# Patient Record
Sex: Male | Born: 1945 | Race: Black or African American | Hispanic: No | Smoking: Current every day smoker
Health system: Southern US, Community
[De-identification: ages and names within clinical notes are randomized; demographics above are authoritative.]

## PROBLEM LIST (undated history)

## (undated) DIAGNOSIS — K219 Gastro-esophageal reflux disease without esophagitis: Secondary | ICD-10-CM

## (undated) DIAGNOSIS — J9600 Acute respiratory failure, unspecified whether with hypoxia or hypercapnia: Secondary | ICD-10-CM

## (undated) DIAGNOSIS — I10 Essential (primary) hypertension: Secondary | ICD-10-CM

## (undated) DIAGNOSIS — J449 Chronic obstructive pulmonary disease, unspecified: Secondary | ICD-10-CM

## (undated) DIAGNOSIS — M4802 Spinal stenosis, cervical region: Secondary | ICD-10-CM

## (undated) DIAGNOSIS — R490 Dysphonia: Secondary | ICD-10-CM

## (undated) DIAGNOSIS — Z720 Tobacco use: Secondary | ICD-10-CM

## (undated) HISTORY — PX: INGUINAL HERNIA REPAIR: SHX194

## (undated) HISTORY — DX: Gastro-esophageal reflux disease without esophagitis: K21.9

## (undated) HISTORY — DX: Dysphonia: R49.0

## (undated) HISTORY — DX: Chronic obstructive pulmonary disease, unspecified: J44.9

## (undated) HISTORY — DX: Spinal stenosis, cervical region: M48.02

## (undated) HISTORY — DX: Tobacco use: Z72.0

---

## 1998-12-20 ENCOUNTER — Encounter: Payer: Self-pay | Admitting: Emergency Medicine

## 1998-12-20 ENCOUNTER — Emergency Department (HOSPITAL_COMMUNITY): Admission: EM | Admit: 1998-12-20 | Discharge: 1998-12-20 | Payer: Self-pay | Admitting: Emergency Medicine

## 2000-08-20 ENCOUNTER — Encounter (HOSPITAL_BASED_OUTPATIENT_CLINIC_OR_DEPARTMENT_OTHER): Payer: Self-pay | Admitting: General Surgery

## 2000-08-22 ENCOUNTER — Encounter (INDEPENDENT_AMBULATORY_CARE_PROVIDER_SITE_OTHER): Payer: Self-pay | Admitting: Specialist

## 2000-08-22 ENCOUNTER — Ambulatory Visit (HOSPITAL_COMMUNITY): Admission: RE | Admit: 2000-08-22 | Discharge: 2000-08-22 | Payer: Self-pay | Admitting: General Surgery

## 2004-02-13 HISTORY — PX: POSTERIOR LAMINECTOMY / DECOMPRESSION CERVICAL SPINE: SUR739

## 2004-04-28 ENCOUNTER — Ambulatory Visit (HOSPITAL_COMMUNITY): Admission: RE | Admit: 2004-04-28 | Discharge: 2004-04-29 | Payer: Self-pay | Admitting: Neurosurgery

## 2005-03-26 ENCOUNTER — Emergency Department (HOSPITAL_COMMUNITY): Admission: EM | Admit: 2005-03-26 | Discharge: 2005-03-26 | Payer: Self-pay | Admitting: Emergency Medicine

## 2006-01-07 ENCOUNTER — Emergency Department (HOSPITAL_COMMUNITY): Admission: EM | Admit: 2006-01-07 | Discharge: 2006-01-07 | Payer: Self-pay | Admitting: Emergency Medicine

## 2006-04-12 ENCOUNTER — Emergency Department (HOSPITAL_COMMUNITY): Admission: EM | Admit: 2006-04-12 | Discharge: 2006-04-12 | Payer: Self-pay | Admitting: Emergency Medicine

## 2007-05-21 ENCOUNTER — Emergency Department (HOSPITAL_COMMUNITY): Admission: EM | Admit: 2007-05-21 | Discharge: 2007-05-21 | Payer: Self-pay | Admitting: Emergency Medicine

## 2009-12-19 ENCOUNTER — Emergency Department (HOSPITAL_COMMUNITY): Admission: EM | Admit: 2009-12-19 | Discharge: 2009-12-19 | Payer: Self-pay | Admitting: Emergency Medicine

## 2010-04-25 LAB — CBC
Hemoglobin: 15.1 g/dL (ref 13.0–17.0)
Platelets: 220 10*3/uL (ref 150–400)
RDW: 13.7 % (ref 11.5–15.5)

## 2010-06-30 NOTE — Op Note (Signed)
[place]. [facility]  Patient:    GILLIS, BOARDLEY                      MRN: [id number] Proc. Date: 08/22/00 Adm. Date:  [id number] Attending:  Sonda Primes                           Operative Report  PREOPERATIVE DIAGNOSIS:  Left inguinal hernia.  POSTOPERATIVE DIAGNOSIS:  Left inguinal hernia.  PROCEDURE:  Left inguinal herniorrhaphy with Prolene mesh.  SURGEON:  Mardene Celeste. Lurene Shadow, M.D.  ASSISTANT:  Nurse.  ANESTHESIA:  MAC.  I used 0.5% Sensorcaine with epinephrine.  CLINICAL NOTE:  This patient is a 65 year old man presenting with a left-sided inguinal hernia, which has been enlarging over time.  He has had a right inguinal herniorrhaphy done in the remote past.  There has been no evidence of recurrence on the right side.  The patient was made aware of the risks and potential benefits of herniorrhaphy and is brought now to the operating room for repair.  DESCRIPTION OF PROCEDURE:  Following the induction of satisfactory sedation with the patient positioned supinely, the left groin is prepped and draped to be included in the sterile operative field.  I infiltrated the lower abdominal crease with 0.5% Sensorcaine with epinephrine.  Transverse incision in the lower abdominal crease was dissected down to and through the external oblique aponeurosis.  Distal injections of Sensorcaine were used throughout the procedure to maintain analgesia.  The external oblique aponeurosis is opened up through the external inguinal ring with protection of the ilioinguinal nerve, which is retracted medially and superiorly.  The patient had a rather large scrotal component, and the spermatic cord along with this very large hernial sac was then held with a Penrose drain and dissected down upon the hernial sac and opened up so that I could reduce the small intestine which was incarcerated into the scrotum back into the peritoneal cavity.  The hernial sac was  then dissected free from the cord, carried up to the internal ring, and the sac was then doubly suture ligated with 2-0 silk sutures and then amputated and the redundant sac removed and forwarded for pathologic evaluation.  The  _____ at [facility] triangle was then repaired with an onlay patch of Prolene mesh sewn in at the pubic tubercle and carried up along the conjoined tendon using a 2-0 running Novofil suture and again from the pubic tubercle up along the shelving edge of Pouparts ligament to the internal ring.  The mesh was split so as to allow protrusion of the cord between the leaflets of the mesh.  The tails of the mesh were trimmed and sutured into the internal oblique muscle, thus completely closing off the internal ring.  All areas of dissection were then checked for hemostasis and noted to be dry.  Sponge, instrument, and sharp counts were verified.  The external oblique aponeurosis was closed over the cord with a running suture of 2-0 Vicryl, subcutaneous tissues and Scarpas fascia closed with a running 3-0 Vicryl suture.  Skin was closed with a 5-0 Monocryl suture and then reinforced with Steri-Strips.  Sterile dressings were applied, anesthetic reversed, and the patient removed from the operating room to the recovery room in stable condition.  He tolerated the procedure well. DD:  08/22/00 TD:  08/22/00 Job: [id number]

## 2010-06-30 NOTE — Op Note (Signed)
NAMEKION, HUNTSBERRY NO.:  [id number]   MEDICAL RECORD NO.:  [id number]          PATIENT TYPE:  OIB   LOCATION:  2899                         FACILITY:  [facility]   PHYSICIAN:  Coletta Memos, M.D.     DATE OF BIRTH:  [birth year]   DATE OF PROCEDURE:  04/28/2004  DATE OF DISCHARGE:                                 OPERATIVE REPORT   PREOPERATIVE DIAGNOSIS:  Cervical spondylosis with myelopathy, cervical  stenosis, cervical degenerative disk disease with myelopathy.   POSTOPERATIVE DIAGNOSES:  Cervical spondylosis with myelopathy, cervical  stenosis, cervical degenerative disk disease with myelopathy.   PROCEDURE:  1.  Anterior cervical decompression C4-5, C5-6, C6-7.  2.  Arthrodesis C4 to C7 with interbody plugs placed at C4-5, 6 mm; C5-6 6      mm,  and C6-7, 7 mm;  anterior plating using Synthes small stature CSLP      plate, eight screws 14 mm in length.   COMPLICATIONS:  None.   SURGEON:  Coletta Memos, M.D.   ASSISTANT:  Hewitt Shorts, M.D.   FINDINGS:  Severe cervical stenosis and significant hypertrophy of the  posterior longitudinal ligament with some packed some areas of  calcification.   INDICATIONS:  Mr. Marvin Grabill is a 65 year old gentleman who presented to  the office with myelopathy. MRI showed severe stenosis present in C4-5 and  C5-6 and an abnormal signal just below the disk space at C6-7. I therefore  recommended and he agreed to undergo operative decompression.   OPERATIVE NOTE:  Mr. Skog was brought to the operating room intubated and  placed under a general anesthetic without difficulty. He was positioned with  his head on a horseshoe in slight extension without traction. The neck was  prepped and he was draped in a sterile fashion. I was able to then open the  skin in horizontal fashion extending from the midline to the left medial  border of the left sternocleidomastoid muscle. I then was able to identify  the platysma and  dissected both rostral and caudal to the platysma. I opened  the platysma in a horizontal fashion. I dissected inferior to the platysma  rostrally and caudally. I created an avascular plane to the cervical spine.  I took an x-ray and it showed the needle to be at C5-6. I then marked that  disk space.  I then reflected the longus colli muscles from C4 to C7. I did  this in a bilateral fashion.  I then started my diskectomy at C5-6 after  placing two distraction pins one at C5, the other one at C6. I opened the  disk space further using the #15 blade and proceeded to complete the  diskectomy using rongeurs, pituitary rongeurs, Kerrison punches, a high-  speed drill and curettes. The ligament was significantly hypertrophied.  Osteophytes were extensive posteriorly, both on C5 and C6.  I did a lot of  drilling to remove the osteophytes and create space in order to decompress  the spinal canal.  I also decompressed both C6 nerve roots. When I was  satisfied with this, I then  placed a 6 mm graft at that level anticipating I  would have to place other grafts and I did not want to over-distract his  neck. I then turned my attention to the C4-5 level, removed the distraction  pin from C6 and placed it into C4. I again completed the diskectomy after  opening the space with a #15 blade using a high-speed drill, Kerrison  punches. Again, the ligament was very thick and large osteophyte off the  posterior aspect of C4. After drilling that down, I was able to get  underneath it and then again decompress the spinal canal and spinal cord. I  also decompressed the C5 roots bilaterally. I then placed a 6 mm graft in  that space after hemostasis was obtained.  I then turned my attention to C6-  7 and I did not need to use distraction pins there. I was able to remove  what was a large overhanging osteophyte anteriorly so that I had access to  the disk space. Once that was done, I completed the diskectomy  using  pituitary rongeurs, 15 blade and curettes. This level frankly may have been  more spondylitic than the others with regard to the bone.  The ligament was  just as thick at this level also. I decompressed both C7 nerve roots. I  irrigated the wound. I then was able to place a 7 mm graft. I then put the  plate on and placed two screws, one in C4, the other in C6. Those screws  looked to be in good position. The plate was the correct length. I then bent  the plate so that it would fit to the spine better. I then placed the  remaining screws with Dr. Earl Gala assistance.  Eight screws were placed  in all, two in C4, two in C5, two C6 and two in C7. Locking screws were also  placed. I then irrigated the wound. I then closed the wound in layered  fashion using Vicryl sutures. Dermabond was used for sterile dressing.      KC/MEDQ  D:  04/28/2004  T:  04/30/2004  Job:  [id number]

## 2011-02-07 ENCOUNTER — Institutional Professional Consult (permissible substitution): Payer: Self-pay | Admitting: Internal Medicine

## 2011-03-12 ENCOUNTER — Encounter: Payer: Self-pay | Admitting: Internal Medicine

## 2011-03-13 ENCOUNTER — Ambulatory Visit (INDEPENDENT_AMBULATORY_CARE_PROVIDER_SITE_OTHER): Payer: Federal, State, Local not specified - PPO | Admitting: Internal Medicine

## 2011-03-13 ENCOUNTER — Other Ambulatory Visit: Payer: Federal, State, Local not specified - PPO

## 2011-03-13 ENCOUNTER — Encounter: Payer: Self-pay | Admitting: Internal Medicine

## 2011-03-13 DIAGNOSIS — M1990 Unspecified osteoarthritis, unspecified site: Secondary | ICD-10-CM

## 2011-03-13 DIAGNOSIS — R053 Chronic cough: Secondary | ICD-10-CM

## 2011-03-13 DIAGNOSIS — Z720 Tobacco use: Secondary | ICD-10-CM

## 2011-03-13 DIAGNOSIS — F17200 Nicotine dependence, unspecified, uncomplicated: Secondary | ICD-10-CM

## 2011-03-13 DIAGNOSIS — M129 Arthropathy, unspecified: Secondary | ICD-10-CM

## 2011-03-13 DIAGNOSIS — J449 Chronic obstructive pulmonary disease, unspecified: Secondary | ICD-10-CM

## 2011-03-13 DIAGNOSIS — R05 Cough: Secondary | ICD-10-CM | POA: Insufficient documentation

## 2011-03-13 MED ORDER — TIOTROPIUM BROMIDE MONOHYDRATE 18 MCG IN CAPS: 18.0000 ug | ORAL_CAPSULE | Freq: Every day | RESPIRATORY_TRACT | Status: DC

## 2011-03-13 NOTE — Progress Notes (Addendum)
Subjective:    Patient ID: Phillip May, male    DOB: 08-19-1945, 66 y.o.   MRN: [id number]  HPI  IOV 03/13/2011  66 year male referrd by Dr Merri Brunette and Abelardo Diesel PA for dyspnea.  Reports that he has been smoking Cigarettes. Smokes 2-3 cigs per day. Been smoking over 30 years. Most amount he smoked was 1 ppd. Wants to quit. Says he is trying hard to quit. Not tried meds. Ready to try medications.    Reports dyspnea. Says it is sporadic but always with exertion like walking fast, sometimes doing groceries, always with 1 flight of steps but never during conversation. Sudden onset 3-4 months ago but does not recollect details. Progressive. Improved by  Rest. Denies associated chest pain, paroxysmal nocturnal dyspnea, wheezing, orthopnea. Denies associated CAD. There is family hx of COPD (dad, dead at age 42 from copd).   This is associated with voice hoarseness  but says his voice has always been 'gravelly". Saw [facility] ENT in 2006 and was told he had GERD. After cervical decompression in 2007 it got worse. There is associated chronic cough x 8-10 months.Has been seeing Dr Narda Bonds recently dec 2012 and was reportedly told there is mucus in throat and given dexilant that helped. Cough is associated feeling of tickle, something stuck in throat and sometimes worse when it lies down. Nocturnal awakenings occasionally from cough +. Denies post nasal drip. OVerall feels cough is from throat. Quality of cough is variable - dry and sometimes with mucus.  RSI cough socre is 37 of total 45 (Level 5 - hoarseness, difficulty swalloing foods. Level 4  - clearing of throat, coughing after lying down, choking episods, troublesome cough, heartburn and Level 3 - senssation of lump in throat)  Of note, reports early morning stiffness of hand joints for years and difficulty with picking objects past few years.   Spirometry at Dr Renne Crigler office 01/08/11 fev1 0.8L/32%, Ratio 41. Flow volume loop variable ? insp  obstruction  CXR 01/29/11:  Hyperinflation with increased reticular markings (personally reviewed)  Body mass index is 20.14 kg/(m^2). on 03/13/11 and feels this weight is stable past 1 year HGb 13.5gm% with MCV 104 in 01/25/11 Creat 1.1mg % 01/25/12  Walking 185 feet x 3 laps in office 03/13/2011 and did not desaturate   Past Medical History  Diagnosis Date  . Inguinal hernia   . COPD (chronic obstructive pulmonary disease)   . GERD (gastroesophageal reflux disease)   . Chronic hoarseness   . Tobacco abuse      Family History  Problem Relation Age of Onset  . Emphysema Father   . Stomach cancer Sister      History   Social History  . Marital Status: Divorced    Spouse Name: N/A    Number of Children: N/A  . Years of Education: N/A   Occupational History  . investigator for [employer]    Social History Main Topics  . Smoking status: Current Everyday Smoker -- 0.2 packs/day for 30 years    Types: Cigarettes  . Smokeless tobacco: Not on file   Comment: 3-4 cigs a day  . Alcohol Use: No  . Drug Use: No  . Sexually Active: Not on file   Other Topics Concern  . Not on file   Social History Narrative  . No narrative on file     No Known Allergies   No outpatient prescriptions prior to visit.  Review of Systems  Constitutional: Negative for fever and unexpected weight change.  HENT: Negative for ear pain, nosebleeds, congestion, sore throat, rhinorrhea, sneezing, trouble swallowing, dental problem, postnasal drip and sinus pressure.   Eyes: Negative for redness and itching.  Respiratory: Positive for chest tightness, shortness of breath and wheezing. Negative for cough.   Cardiovascular: Negative for palpitations and leg swelling.  Gastrointestinal: Negative for nausea and vomiting.  Genitourinary: Negative for dysuria.  Musculoskeletal: Negative for joint swelling.  Skin: Negative for rash.  Neurological: Negative for headaches.    Hematological: Does not bruise/bleed easily.  Psychiatric/Behavioral: Negative for dysphoric mood. The patient is not nervous/anxious.        Objective:   Physical Exam  Nursing note and vitals reviewed. Constitutional: He is oriented to person, place, and time. He appears well-developed and well-nourished. No distress.       Body mass index is 20.14 kg/(m^2).   HENT:  Head: Normocephalic and atraumatic.  Right Ear: External ear normal.  Left Ear: External ear normal.  Mouth/Throat: Oropharynx is clear and moist. No oropharyngeal exudate.       Hoarse voice which he says is baseline  Eyes: Conjunctivae and EOM are normal. Pupils are equal, round, and reactive to light. Right eye exhibits no discharge. Left eye exhibits no discharge. No scleral icterus.  Neck: Normal range of motion. Neck supple. No JVD present. No tracheal deviation present. No thyromegaly present.  Cardiovascular: Normal rate, regular rhythm and intact distal pulses.  Exam reveals no gallop and no friction rub.   No murmur heard. Pulmonary/Chest: Effort normal and breath sounds normal. No respiratory distress. He has no wheezes. He has no rales. He exhibits no tenderness.       barrell chest Prolonged expiration No wheeze No distress  Abdominal: Soft. Bowel sounds are normal. He exhibits no distension and no mass. There is no tenderness. There is no rebound and no guarding.  Musculoskeletal: Normal range of motion. He exhibits no edema and no tenderness.       Hands appear to have deformities c/w Rheumatoid arthritis. Denies raynaud  Lymphadenopathy:    He has no cervical adenopathy.  Neurological: He is alert and oriented to person, place, and time. He has normal reflexes. No cranial nerve deficit. Coordination normal.  Skin: Skin is warm and dry. No rash noted. He is not diaphoretic. No erythema. No pallor.  Psychiatric: He has a normal mood and affect. His behavior is normal. Judgment and thought content  normal.          Assessment & Plan:

## 2011-03-13 NOTE — Assessment & Plan Note (Signed)
?   Has RA on physical exam  Plan Check ana, ra factor and anti ccp

## 2011-03-13 NOTE — Assessment & Plan Note (Signed)
RSI score 37 is very c/w LPR cough. There is hx of GERD but no clear cut sinus history. Will Rx copd with spiriva first and reasess with RSI cough score

## 2011-03-13 NOTE — Assessment & Plan Note (Signed)
He knows he needs to quit. WE discussed quitting. He thinks he is ready to quit although a month ago he told PMD he is not ready to quit. HE will start by trying to quit the first cig in the morning prior to leaving for work.   3 min counseling

## 2011-03-13 NOTE — Patient Instructions (Signed)
#  COPD  - it appears this is severe  - please start Please start spiriva 1 puff daily - take sample, script and show technique - have followup full breathing test called PFT in 1 month - alpha 1 check at followup #SMoking  - please start by working on quitting the first morning cigarette #Cough  - let us see how this improves with spiriva  - at followup will do cough score #ARthritis of hand  - check blood test for rheumatoid arthritis #Followup  - return to see me in 1 month with full PFT done at time of followup  - needs cough score and alpha 1 check at followup

## 2011-03-13 NOTE — Assessment & Plan Note (Signed)
Appears to have severe copd on outside spirometry but is not desaturating on simple exertion.  PLAN Start spiriva and reassess with full PFT in  1 month

## 2011-03-14 LAB — ANA: Anti Nuclear Antibody(ANA): NEGATIVE

## 2011-03-27 ENCOUNTER — Telehealth: Payer: Self-pay | Admitting: Internal Medicine

## 2011-03-27 NOTE — Telephone Encounter (Signed)
Advised pt Rx was sent to [facility] on [address] on 1/29 & should be awaiting his pick up.  Pt advised nothing further needed.  Antionette Fairy

## 2011-03-27 NOTE — Telephone Encounter (Signed)
lmomtcb x1 shows rx was sent on 03/13/11

## 2011-04-23 ENCOUNTER — Encounter: Payer: Self-pay | Admitting: Neurology

## 2011-04-23 ENCOUNTER — Ambulatory Visit (INDEPENDENT_AMBULATORY_CARE_PROVIDER_SITE_OTHER): Payer: Federal, State, Local not specified - PPO | Admitting: Internal Medicine

## 2011-04-23 ENCOUNTER — Encounter: Payer: Self-pay | Admitting: Internal Medicine

## 2011-04-23 VITALS — BP 126/78 | HR 67 | Temp 98.5°F | Ht 67.0 in | Wt 131.0 lb

## 2011-04-23 DIAGNOSIS — M069 Rheumatoid arthritis, unspecified: Secondary | ICD-10-CM

## 2011-04-23 DIAGNOSIS — M1990 Unspecified osteoarthritis, unspecified site: Secondary | ICD-10-CM

## 2011-04-23 DIAGNOSIS — R05 Cough: Secondary | ICD-10-CM

## 2011-04-23 DIAGNOSIS — J449 Chronic obstructive pulmonary disease, unspecified: Secondary | ICD-10-CM

## 2011-04-23 DIAGNOSIS — M129 Arthropathy, unspecified: Secondary | ICD-10-CM

## 2011-04-23 DIAGNOSIS — F17200 Nicotine dependence, unspecified, uncomplicated: Secondary | ICD-10-CM

## 2011-04-23 DIAGNOSIS — Z720 Tobacco use: Secondary | ICD-10-CM

## 2011-04-23 LAB — PULMONARY FUNCTION TEST

## 2011-04-23 MED ORDER — BUDESONIDE-FORMOTEROL FUMARATE 160-4.5 MCG/ACT IN AERO: 2.0000 | INHALATION_SPRAY | Freq: Two times a day (BID) | RESPIRATORY_TRACT | Status: AC

## 2011-04-23 NOTE — Assessment & Plan Note (Signed)
Improved 50 % with spiriva. Will see response to symbicort. Check cough score at fu. Noted serration on flow volume on PFT 04/23/2011 c/w hoarse voice which is likely contributing to cough

## 2011-04-23 NOTE — Assessment & Plan Note (Signed)
Says RA ruled out through xrays and blood work by DR Dierdre Forth. HE feels he has coordination issue. Will refer to Dr Modesto Charon neurologist at [facility]

## 2011-04-23 NOTE — Progress Notes (Signed)
Subjective:    Patient ID: Phillip May, male    DOB: 26-Sep-1945, 66 y.o.   MRN: [id number]  HPI OV 03/13/2011  66 year male referrd by Dr Merri Brunette and Abelardo Diesel PA for dyspnea.  Reports that he has been smoking Cigarettes. Smokes 2-3 cigs per day. Been smoking over 30 years. Most amount he smoked was 1 ppd. Wants to quit. Says he is trying hard to quit. Not tried meds. Ready to try medications.    Reports dyspnea. Says it is sporadic but always with exertion like walking fast, sometimes doing groceries, always with 1 flight of steps but never during conversation. Sudden onset 3-4 months ago but does not recollect details. Progressive. Improved by  Rest. Denies associated chest pain, paroxysmal nocturnal dyspnea, wheezing, orthopnea. Denies associated CAD. There is family hx of COPD (dad, dead at age 56 from copd).   This is associated with voice hoarseness  but says his voice has always been 'gravelly". Saw [facility] in 2006 and was told he had GERD. After cervical decompression in 2007 it got worse. There is associated chronic cough x 8-10 months.Has been seeing Dr Narda Bonds recently dec 2012 and was reportedly told there is mucus in throat and given dexilant that helped. Cough is associated feeling of tickle, something stuck in throat and sometimes worse when it lies down. Nocturnal awakenings occasionally from cough +. Denies post nasal drip. OVerall feels cough is from throat. Quality of cough is variable - dry and sometimes with mucus.  RSI cough socre is 37 of total 45 (Level 5 - hoarseness, difficulty swalloing foods. Level 4  - clearing of throat, coughing after lying down, choking episods, troublesome cough, heartburn and Level 3 - senssation of lump in throat)  Of note, reports early morning stiffness of hand joints for years and difficulty with picking objects past few years.   Spirometry at Dr Renne Crigler office 01/08/11 fev1 0.8L/32%, Ratio 41. Flow volume loop variable ? insp  obstruction  CXR 01/29/11:  Hyperinflation with increased reticular markings (personally reviewed)  Body mass index is 20.14 kg/(m^2). on 03/13/11 and feels this weight is stable past 1 year HGb 13.5gm% with MCV 104 in 01/25/11 Creat 1.1mg % 01/25/12  Walking 185 feet x 3 laps in office 03/13/2011 and did not desaturate  REC  #COPD  - it appears this is severe  - please start Please start spiriva 1 puff daily - take sample, script and show technique - have followup full breathing test called PFT in 1 month - alpha 1 check at followup #SMoking  - please start by working on quitting the first morning cigarette #Cough  - let us see how this improves with spiriva  - at followup will do cough score #ARthritis of hand  - check blood test for rheumatoid arthritis #Followup  - return to see me in 1 month with full PFT done at time of followup  - needs cough score and alpha 1 check at followup   OV 04/23/2011 Followup for above. Says dyspnea and cough 50% better with spiriva although since this morning he thinks dyspnea a tad bit worse. Denies fever, sputum, wheeze or feeling of illness. PFT today shows gold stage 4 copd - fev1 0.8L/29%, 9% BD response, RAtop 37, DLCO 7/42%. Flow volume loop show serrations c/w hoarse voice. Marland Kitchen Has had flu shot. Wants to postpone pneumovax to next visit.  No change in hoarse voice. STill struggling to quit smoking. Says he has cut down but  has frequent relapses.IN terms of arthritis of hand  - he denies arthritis. Says Dr Dierdre Forth has ruled out RA and other autoimmune diseaess. Feels he has neuro issue and coordination issue. Asking for referral to neurologist.   Past, Family, Social reviewed: no change since last visit other than HPI   Review of Systems  Constitutional: Negative for fever and unexpected weight change.  HENT: Negative for ear pain, nosebleeds, congestion, sore throat, rhinorrhea, sneezing, trouble swallowing, dental problem, postnasal drip and  sinus pressure.   Eyes: Negative for redness and itching.  Respiratory: Positive for cough, shortness of breath and wheezing. Negative for chest tightness.   Cardiovascular: Negative for palpitations and leg swelling.  Gastrointestinal: Negative for nausea and vomiting.  Genitourinary: Negative for dysuria.  Musculoskeletal: Negative for joint swelling.  Skin: Negative for rash.  Neurological: Negative for headaches.  Hematological: Does not bruise/bleed easily.  Psychiatric/Behavioral: Negative for dysphoric mood. The patient is not nervous/anxious.        Objective:   Physical Exam Physical Exam  Nursing note and vitals reviewed. Constitutional: He is oriented to person, place, and time. He appears well-developed and well-nourished. No distress.       Body mass index is 20.14 kg/(m^2).   HENT:  Head: Normocephalic and atraumatic.  Right Ear: External ear normal.  Left Ear: External ear normal.  Mouth/Throat: Oropharynx is clear and moist. No oropharyngeal exudate.       Hoarse voice which he says is baseline  Eyes: Conjunctivae and EOM are normal. Pupils are equal, round, and reactive to light. Right eye exhibits no discharge. Left eye exhibits no discharge. No scleral icterus.  Neck: Normal range of motion. Neck supple. No JVD present. No tracheal deviation present. No thyromegaly present.  Cardiovascular: Normal rate, regular rhythm and intact distal pulses.  Exam reveals no gallop and no friction rub.   No murmur heard. Pulmonary/Chest: Effort normal and breath sounds normal. No respiratory distress. He has no wheezes. He has no rales. He exhibits no tenderness.       barrell chest Prolonged expiration No wheeze No distress  Abdominal: Soft. Bowel sounds are normal. He exhibits no distension and no mass. There is no tenderness. There is no rebound and no guarding.  Musculoskeletal: Normal range of motion. He exhibits no edema and no tenderness.       Hands appear to have  deformities Lymphadenopathy:    He has no cervical adenopathy.  Neurological: He is alert and oriented to person, place, and time. He has normal reflexes. No cranial nerve deficit. Coordination normal.  Skin: Skin is warm and dry. No rash noted. He is not diaphoretic. No erythema. No pallor.  Psychiatric: He has a normal mood and affect. His behavior is normal. Judgment and thought content normal.         Assessment & Plan:  I

## 2011-04-23 NOTE — Progress Notes (Signed)
PFT done today. 

## 2011-04-23 NOTE — Assessment & Plan Note (Signed)
Discussed 3 minutes quitting strategies. HE is struggling. To start with asked him to work on pushing first cig in am from 8.30am to 10.30am

## 2011-04-23 NOTE — Assessment & Plan Note (Signed)
#  COPD  - it appears this is very  severe  - please continue  spiriva 1 puff daily \ - Please start symbicort 160/4.5 2 puff twice daily - take sample, script and show technique - refer to pulmonary rehab - asked to call if symptoms worsen, then we can do pred and antibiotics over phone - at followup give pneumovax, check alpha 1

## 2011-04-23 NOTE — Patient Instructions (Addendum)
#  COPD  - it appears this is very  severe  - please continue  spiriva 1 puff daily \ - Please start symbicort 160/4.5 2 puff twice daily - take sample, script and show technique - refer to pulmonary rehab  #SMoking  - please start by working on quitting the first morning cigarette or atleast try to push to first cig to 10.30am  #Cough  - let us see how this improves with spiriva and symbicort  - at followup will do cough score  #ARthritis of hand and coordination issues - glad Rheumatoid ruled out by history  - will refer to neurologist DR Modesto Charon for assessment of fine motor coordination  - #Followup  - return to see me in 1 month   - needs cough score and alpha 1 check at followup

## 2011-05-09 ENCOUNTER — Ambulatory Visit: Payer: Federal, State, Local not specified - PPO | Admitting: Neurology

## 2011-05-21 ENCOUNTER — Encounter: Payer: Self-pay | Admitting: Neurology

## 2011-05-21 ENCOUNTER — Ambulatory Visit (INDEPENDENT_AMBULATORY_CARE_PROVIDER_SITE_OTHER): Payer: Federal, State, Local not specified - PPO | Admitting: Neurology

## 2011-05-21 VITALS — BP 138/84 | HR 88 | Wt 126.0 lb

## 2011-05-21 DIAGNOSIS — R209 Unspecified disturbances of skin sensation: Secondary | ICD-10-CM

## 2011-05-21 DIAGNOSIS — R200 Anesthesia of skin: Secondary | ICD-10-CM

## 2011-05-21 MED ORDER — ALPRAZOLAM 1 MG PO TABS: ORAL_TABLET | ORAL | Status: DC

## 2011-05-21 NOTE — Patient Instructions (Signed)
Your MRI is scheduled for Thursday, April 11th at 12:00 noon.   Please arrive to [facility], first floor admitting by 11.45am.  [phone].  We will call you with your appointment for the nerve conduction studies/electromyelogram at [facility] [address] [place], [place] [phone].

## 2011-05-21 NOTE — Progress Notes (Signed)
Dear Dr. Marchelle Gearing,  Thank you for having me see Francene Castle in consultation today at [facility] for his problem with bilateral hand numbness and difficulty doing fine finger movements.  As you may recall, he is a 66 y.o. 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  He denies positive sensory symptoms, unlike his symptoms before he had his cervical decompression which were described as tingling.  He denies relationship to neck movements.  He says that he has difficult telling a quarter from a dime.  While it affects all his hands he thinks it is worse in his bilateral index fingers.    He denies bladder or bowel problems.  He does have numbness in his feet, and thinks this has gotten worse over the last year as well.  Past Medical History  Diagnosis Date  . Inguinal hernia   . COPD (chronic obstructive pulmonary disease)   . GERD (gastroesophageal reflux disease)   . Chronic hoarseness   . Tobacco abuse   . Cervical spinal stenosis     C4-C5; C5-C6; severe stenosis and abnormal  cord signal C6-C7 s/p decompression in 04/2004    Past Surgical History  Procedure Date  . Inguinal hernia repair   . Posterior laminectomy / decompression cervical spine 2006    History   Social History  . Marital Status: Divorced    Spouse Name: N/A    Number of Children: N/A  . Years of Education: N/A   Occupational History  . investigator for [employer]    Social History Main Topics  . Smoking status: Current Everyday Smoker -- 0.2 packs/day for 30 years    Types: Cigarettes  . Smokeless tobacco: Never Used   Comment: 3-4 cigs a day  . Alcohol Use: 7.0 oz/week    14 drink(s) per week     2011 and earlier would frequently drink more than 2 drinks per day.  . Drug Use: No  . Sexually  Active: None   Other Topics Concern  . None   Social History Narrative  . None     Family History  Problem Relation Age of Onset  . Emphysema Father   . Stomach cancer Sister     Current Outpatient Prescriptions on File Prior to Visit  Medication Sig Dispense Refill  . AZOR 5-40 MG per tablet Take 1 tablet by mouth Daily.      . budesonide-formoterol (SYMBICORT) 160-4.5 MCG/ACT inhaler Inhale 2 puffs into the lungs 2 (two) times daily.  1 Inhaler  12  . Multiple Vitamins-Minerals (MULTIVITAMIN WITH MINERALS) tablet Take 2 tablets by mouth daily.      Marland Kitchen tiotropium (SPIRIVA HANDIHALER) 18 MCG inhalation capsule Place 1 capsule (18 mcg total) into inhaler and inhale daily.  30 capsule  2    No Known Allergies    ROS:  13 systems were reviewed and are notable for severe difficulty breathing secondary to COPD, chronic cough and sputum production.  All other review of systems are unremarkable.   Examination:  Filed Vitals:   05/21/11 1536  BP: 138/84  Pulse: 88  Weight: 126 lb (57.153 kg)     In general, think appearing man.  Cardiovascular: The patient has a regular rate and rhythm and no carotid bruits.  Fundoscopy:  Disks are flat. Vessel caliber within normal limits.  Mental status:   The patient is oriented to person, place and time. Recent and remote memory are intact. Attention span and concentration are normal. Language including repetition, naming, following commands are intact. Fund of knowledge of current and historical events, as well as vocabulary are normal.  Cranial Nerves: Pupils are equally round and reactive to light. Visual fields full to confrontation. Extraocular movements are intact without nystagmus. Facial sensation and muscles of mastication are intact. Muscles of facial expression are symmetric. Hearing intact to bilateral finger rub. Tongue protrusion, uvula, palate midline.  Shoulder shrug intact  Motor:  The patient appears to have  decreased bulk in his upper extremities throughout. No spasticity in lower extremities. There is mild  wasting of the thenar eminence on the right more than left.    5/5 muscle strength bilaterally except for finger abduction in bilateral hands.  Finger extension also is mildly weak.  Thumb abduction and opposition is week bilaterally as well.  Reflexes:   Biceps Brachioradialis Triceps Patella Ankles Babinski  Right 2+ 1+ 3+ 3+ 4+ up  Left 0 0 3+ 3+ 3+ up    Coordination:  Normal finger to nose.  No dysdiadokinesia.  Sensation is normal in the upper extremities to temperature.  However, vibration seems to localize to reduce function in the distal C8 dermatome bilaterally when compared to C7 and C6.  Gait and Station are wide based, slightly stiff slapping gait.  +Romberg.   Impression/Recs: 1.  Progressive numbness in his bilateral hands - I think this is likely from a lower cervical radiculopathy, likely C8 or C7.  However, CTS is a possibility as well, although the subtle weakness in his hands involves more than the median nerve distribution.  He also clearly has myelopathic signs that likely explain his difficulty walking likely represent both corticospinal tract and posterior column dysfunction that is old.  I will proceed with an EMG/NCS and MRI C-spine.  The patient has been told to call me after her EMG/NCS.   We will see the patient back in 2 months.  Thank you for having [place] see Francene Castle in consultation.  Feel free to contact me with any questions.  Lupita Raider Modesto Charon, MD [facility], [place] [address] [place], [place] [address] Phone: [phone] Fax: [phone].

## 2011-05-24 ENCOUNTER — Other Ambulatory Visit (HOSPITAL_COMMUNITY): Payer: Federal, State, Local not specified - PPO

## 2011-06-07 ENCOUNTER — Ambulatory Visit: Payer: Federal, State, Local not specified - PPO | Admitting: Internal Medicine

## 2011-07-31 ENCOUNTER — Ambulatory Visit: Payer: Federal, State, Local not specified - PPO | Admitting: Neurology

## 2012-06-02 ENCOUNTER — Other Ambulatory Visit: Payer: Self-pay | Admitting: Internal Medicine

## 2012-07-14 ENCOUNTER — Other Ambulatory Visit: Payer: Self-pay | Admitting: Internal Medicine

## 2012-08-05 ENCOUNTER — Other Ambulatory Visit: Payer: Self-pay | Admitting: Gastroenterology

## 2012-08-05 DIAGNOSIS — R1310 Dysphagia, unspecified: Secondary | ICD-10-CM

## 2012-08-20 ENCOUNTER — Ambulatory Visit
Admission: RE | Admit: 2012-08-20 | Discharge: 2012-08-20 | Disposition: A | Discharge status: Home / Self Care | Payer: Federal, State, Local not specified - PPO | Source: Ambulatory Visit | Attending: Gastroenterology | Admitting: Gastroenterology

## 2012-08-20 DIAGNOSIS — R1310 Dysphagia, unspecified: Secondary | ICD-10-CM

## 2012-08-21 ENCOUNTER — Other Ambulatory Visit (HOSPITAL_COMMUNITY): Payer: Self-pay | Admitting: Gastroenterology

## 2012-08-21 DIAGNOSIS — R1310 Dysphagia, unspecified: Secondary | ICD-10-CM

## 2012-08-27 ENCOUNTER — Ambulatory Visit (HOSPITAL_COMMUNITY): Payer: Federal, State, Local not specified - PPO

## 2012-09-02 ENCOUNTER — Ambulatory Visit (HOSPITAL_COMMUNITY): Admission: RE | Admit: 2012-09-02 | Payer: Federal, State, Local not specified - PPO | Source: Ambulatory Visit

## 2012-09-02 ENCOUNTER — Inpatient Hospital Stay (HOSPITAL_COMMUNITY): Admission: RE | Admit: 2012-09-02 | Payer: Federal, State, Local not specified - PPO | Source: Ambulatory Visit

## 2012-09-10 ENCOUNTER — Other Ambulatory Visit (HOSPITAL_COMMUNITY): Payer: Federal, State, Local not specified - PPO

## 2012-09-10 ENCOUNTER — Ambulatory Visit (HOSPITAL_COMMUNITY): Admission: RE | Admit: 2012-09-10 | Payer: Federal, State, Local not specified - PPO | Source: Ambulatory Visit

## 2012-10-20 ENCOUNTER — Other Ambulatory Visit (HOSPITAL_COMMUNITY): Payer: Self-pay | Admitting: Gastroenterology

## 2012-10-20 DIAGNOSIS — R1310 Dysphagia, unspecified: Secondary | ICD-10-CM

## 2012-10-28 ENCOUNTER — Ambulatory Visit (HOSPITAL_COMMUNITY)
Admission: RE | Admit: 2012-10-28 | Discharge: 2012-10-28 | Disposition: A | Discharge status: Home / Self Care | Payer: Federal, State, Local not specified - PPO | Source: Ambulatory Visit | Attending: Gastroenterology | Admitting: Gastroenterology

## 2012-10-28 DIAGNOSIS — J4489 Other specified chronic obstructive pulmonary disease: Secondary | ICD-10-CM | POA: Insufficient documentation

## 2012-10-28 DIAGNOSIS — M4802 Spinal stenosis, cervical region: Secondary | ICD-10-CM | POA: Insufficient documentation

## 2012-10-28 DIAGNOSIS — K219 Gastro-esophageal reflux disease without esophagitis: Secondary | ICD-10-CM | POA: Insufficient documentation

## 2012-10-28 DIAGNOSIS — J449 Chronic obstructive pulmonary disease, unspecified: Secondary | ICD-10-CM | POA: Insufficient documentation

## 2012-10-28 DIAGNOSIS — R1313 Dysphagia, pharyngeal phase: Secondary | ICD-10-CM | POA: Insufficient documentation

## 2012-10-28 DIAGNOSIS — T17308A Unspecified foreign body in larynx causing other injury, initial encounter: Secondary | ICD-10-CM | POA: Insufficient documentation

## 2012-10-28 DIAGNOSIS — R1310 Dysphagia, unspecified: Secondary | ICD-10-CM

## 2012-10-28 DIAGNOSIS — R1311 Dysphagia, oral phase: Secondary | ICD-10-CM | POA: Insufficient documentation

## 2012-10-28 DIAGNOSIS — IMO0002 Reserved for concepts with insufficient information to code with codable children: Secondary | ICD-10-CM | POA: Insufficient documentation

## 2012-10-28 NOTE — Procedures (Addendum)
Objective Swallowing Evaluation: Modified Barium Swallowing Study  Patient Details  Name: Phillip May MRN: [id number] Date of Birth: 02/14/1945  Today's Date: 10/28/2012 Time: 1330-1409 SLP Time Calculation (min): 39 min  Past Medical History:  Past Medical History  Diagnosis Date  . Inguinal hernia   . COPD (chronic obstructive pulmonary disease)   . GERD (gastroesophageal reflux disease)   . Chronic hoarseness   . Tobacco abuse   . Cervical spinal stenosis     C4-C5; C5-C6; severe stenosis and abnormal  cord signal C6-C7 s/p decompression in 04/2004   Past Surgical History:  Past Surgical History  Procedure Laterality Date  . Inguinal hernia repair    . Posterior laminectomy / decompression cervical spine  2006   HPI:  67 yo male referred by Dr Bosie Clos for MBS due to pt complaint of dysphagia and moderate tracheal aspiration noted on esophagus/barium study completed August 20, 2012.  Pt PMH + for COPD, rhinitis, esophageal reflux, Hep C, sinusitis, joint pain, bronchitis, hoarseness, HTN.  PSH + for inguinal hernia repair, cervical decompression 04/2004, wisdom teeth removal.  Pt is a current intermittent cigarette and ETOH user and per chart review has severe COPD. He admits to 30 pound weight loss in 3 years which he attributes to dysphagia.  He denies requiring heimlich manuever or having pulmonary infections. Pt reports problems swallowing pills starting approximately 10 months ago that has progressed to difficulties with foods. He states he consumes liquids to wash down foods that he feels lodges in his throat.   He denies difficulties with liquids.  Chronic throat clearing noted during interview process and vocal hoaresness which pt states has been present since his cervical decompression surgery.       Assessment / Plan / Recommendation Clinical Impression  Dysphagia Diagnosis: Mild oral phase dysphagia;Moderate pharyngeal phase dysphagia  Clinical impression: Mild oral and  moderate pharyngeal sensorimotor dysphagia with concerns for primarily cervical esophageal deficits.    Glossopharyngeal and vagal nerve involvement questioned based on CN examination and findings of MBS.  Mild decreased oral coordination and minimal oral stasis of liquids present after the swallow prematurely spilled into pharynx without pt awareness- requiring cues to swallow or "hock" to expectorate.  Stasis noted throughout pharynx due to decreased tongue base retraction and decreased laryngeal elevation with intermittent sensation.  Pt conducts several swallows with each bolus but appeared with improved efficiency with larger bolus size of liquids. He conducted four swallows to clear 90% of a tsp of liquid but able to swallow larger straw sip more efficiently - ? sensory input compensation.  Pt does not sense when trace-mild vallecular-pyriform stasis present (mixed with secretions) but cued swallow and "hock" effective to clear 80%.   Trace aspiration of thin liquids noted x2 that was silent.  There was no significant pharyngeal stasis with pudding, therefore suspect pt sensation of food lodging may be due to cervical esophageal deficit.   Suspect pt has a component of chronic silent aspiration given findings, chronic hoarseness, throat clearing, etc..  Educated pt to compensation strategies to mitigate aspiration and improve effiency of swallow using live video, verbal and written feedback.    Concern for weight loss that pt reports has occured over the last 3 years is present.  Pt may be at increased risk for ramfications of dysphagia if pt becomes deconditioned or acutely ill given baseline deficits.  Thank you for this referral.         Treatment Recommendation   N/A  Diet Recommendation Dysphagia 3 (Mechanical Soft);Thin liquid   Liquid Administration via: Cup;Straw Medication Administration:  (crush with liquids) Supervision: Patient able to self feed Compensations: Small  sips/bites;Multiple dry swallows after each bite/sip;Follow solids with liquid (hock and expectorate prn, cough to clear and rest prn) Postural Changes and/or Swallow Maneuvers: Seated upright 90 degrees;Upright 30-60 min after meal    Other  Recommendations Recommended Consults: Consider ENT evaluation Oral Care Recommendations: Oral care QID Other Recommendations:  (heimlich manuever handout given)   Follow Up Recommendations  None    Frequency and Duration        Pertinent Vitals/Pain N/a, no pain    SLP Swallow Goals     General HPI: 67 yo male referred by Dr Bosie Clos for MBS due to pt complaint of dysphagia and moderate tracheal aspiration noted on esophagus/barium study completed August 20, 2012.  Pt PMH + for COPD, rhinitis, esophageal reflux, Hep C, sinusitis, joint pain, bronchitis, hoarseness, HTN.  PSH + for inguinal hernia repair, cervical decompression 04/2004, wisdom teeth removal.  Pt is a current intermittent cigarette and ETOH user and per chart review has severe COPD. He admits to 30 pound weight loss in 3 years which he attributes to dysphagia.  He denies requiring heimlich manuever or having pulmonary infections. Pt reports problems swallowing pills starting approximately 10 months ago that has progressed to difficulties with foods. He states he consumes liquids to wash down foods that he feels lodges in his throat.   He denies difficulties with liquids.  Chronic throat clearing noted during interview process and vocal hoaresness which pt states has been present since his cervical decompression surgery.   Type of Study: Modified Barium Swallowing Study Reason for Referral: Objectively evaluate swallowing function Previous Swallow Assessment: esophagram 08/2012 abnormal oral phase of swallowing with multiple pharyngeal contractions, narrowing of lower cervical esophagus at the thoracic inlet with pooling of contrast within a dilated cervical esophagus, moderate tracheal  aspiratation demonstrated on initial swallow - study was aborted Diet Prior to this Study: Regular;Thin liquids Temperature Spikes Noted: No Respiratory Status: Room air History of Recent Intubation: No Behavior/Cognition: Alert;Cooperative;Pleasant mood Oral Cavity - Dentition: Adequate natural dentition (partials) Self-Feeding Abilities: Needs assist (? nodules on hands, arthritis, difficulty feeding self) Patient Positioning: Upright in chair Baseline Vocal Quality: Hoarse;Wet;Low vocal intensity Volitional Cough: Strong Volitional Swallow: Able to elicit Anatomy: Other (Comment) Pharyngeal Secretions: Standing secretions in (comment) (mix with barium noted in pharynx at pyriform sinus region)    Reason for Referral Objectively evaluate swallowing function   Oral Phase Oral Preparation/Oral Phase Oral Phase: Impaired Oral - Nectar Oral - Nectar Teaspoon: Delayed oral transit;Lingual/palatal residue Oral - Nectar Cup: Delayed oral transit;Lingual/palatal residue Oral - Thin Oral - Thin Teaspoon: Delayed oral transit;Lingual/palatal residue Oral - Thin Cup: Delayed oral transit;Lingual/palatal residue Oral - Thin Straw: Delayed oral transit;Lingual/palatal residue Oral - Solids Oral - Puree: Delayed oral transit Oral - Regular: Delayed oral transit;Impaired mastication   Pharyngeal Phase Pharyngeal Phase Pharyngeal Phase: Impaired Pharyngeal - Nectar Pharyngeal - Nectar Teaspoon: Reduced airway/laryngeal closure;Reduced laryngeal elevation;Reduced anterior laryngeal mobility;Reduced tongue base retraction;Pharyngeal residue - valleculae;Pharyngeal residue - pyriform sinuses;Pharyngeal residue - cp segment;Penetration/Aspiration during swallow Penetration/Aspiration details (nectar teaspoon): Material enters airway, remains ABOVE vocal cords and not ejected out Pharyngeal - Nectar Cup: Pharyngeal residue - cp segment;Pharyngeal residue - pyriform sinuses;Pharyngeal residue -  valleculae;Reduced anterior laryngeal mobility;Reduced epiglottic inversion;Reduced laryngeal elevation;Reduced airway/laryngeal closure;Penetration/Aspiration during swallow Penetration/Aspiration details (nectar cup): Material enters airway, remains ABOVE vocal cords and  not ejected out Pharyngeal - Thin Pharyngeal - Thin Teaspoon: Trace aspiration;Reduced epiglottic inversion;Reduced anterior laryngeal mobility;Reduced laryngeal elevation;Reduced airway/laryngeal closure;Pharyngeal residue - pyriform sinuses;Pharyngeal residue - valleculae;Pharyngeal residue - cp segment;Penetration/Aspiration after swallow Penetration/Aspiration details (thin teaspoon): Material enters airway, passes BELOW cords and not ejected out despite cough attempt by patient Pharyngeal - Thin Cup: Penetration/Aspiration after swallow;Reduced tongue base retraction;Reduced laryngeal elevation;Reduced airway/laryngeal closure;Reduced anterior laryngeal mobility;Reduced epiglottic inversion;Trace aspiration;Pharyngeal residue - valleculae;Pharyngeal residue - pyriform sinuses;Pharyngeal residue - cp segment Penetration/Aspiration details (thin cup): Material enters airway, passes BELOW cords without attempt by patient to eject out (silent aspiration);Material enters airway, passes BELOW cords then ejected out Pharyngeal - Thin Straw: Pharyngeal residue - pyriform sinuses;Pharyngeal residue - valleculae;Pharyngeal residue - cp segment;Reduced anterior laryngeal mobility;Reduced epiglottic inversion;Reduced airway/laryngeal closure;Reduced laryngeal elevation;Penetration/Aspiration during swallow Penetration/Aspiration details (thin straw): Material enters airway, remains ABOVE vocal cords and not ejected out Pharyngeal - Solids Pharyngeal - Puree: Premature spillage to valleculae;Pharyngeal residue - valleculae;Reduced tongue base retraction Pharyngeal - Regular: Premature spillage to valleculae;Pharyngeal residue -  valleculae;Reduced tongue base retraction  Cervical Esophageal Phase    GO    Cervical Esophageal Phase Cervical Esophageal Phase: Impaired (see notes in comment section) Cervical Esophageal Phase - Comment Cervical Esophageal Comment: SLP requested flouro radiologist presence in room re: Mr Frede's swallowing study findings.  Dr Bradly Chris, radiologist stated findings consistent with esophagram from 08/2012 indicating narrowing of lower cervical esophagus and dilated cervical esophagus.  No aspiration observed while Dr Bradly Chris was present and observing pt consume nectar thick barium.      Functional Assessment Tool Used: MBS, clinical judgement Functional Limitations: Swallowing Swallow Current Status (Z3086): At least 40 percent but less than 60 percent impaired, limited or restricted Swallow Goal Status [id number]): At least 40 percent but less than 60 percent impaired, limited or restricted Swallow Discharge Status [phone]): At least 40 percent but less than 60 percent impaired, limited or restricted    Donavan Burnet, MS [facility] SLP [phone]

## 2012-12-31 ENCOUNTER — Ambulatory Visit: Payer: Federal, State, Local not specified - PPO | Admitting: Internal Medicine

## 2014-07-17 ENCOUNTER — Emergency Department (HOSPITAL_COMMUNITY): Payer: Medicare Other

## 2014-07-17 ENCOUNTER — Encounter (HOSPITAL_COMMUNITY): Payer: Self-pay | Admitting: *Deleted

## 2014-07-17 ENCOUNTER — Other Ambulatory Visit (HOSPITAL_COMMUNITY): Payer: Self-pay

## 2014-07-17 ENCOUNTER — Inpatient Hospital Stay (HOSPITAL_COMMUNITY)
Admission: EM | Admit: 2014-07-17 | Discharge: 2014-07-30 | DRG: 853 | Disposition: A | Discharge status: Skilled Nursing Facility | Payer: Medicare Other | Attending: Internal Medicine | Admitting: Internal Medicine

## 2014-07-17 DIAGNOSIS — S72142A Displaced intertrochanteric fracture of left femur, initial encounter for closed fracture: Secondary | ICD-10-CM | POA: Diagnosis not present

## 2014-07-17 DIAGNOSIS — J189 Pneumonia, unspecified organism: Secondary | ICD-10-CM | POA: Diagnosis not present

## 2014-07-17 DIAGNOSIS — M4802 Spinal stenosis, cervical region: Secondary | ICD-10-CM | POA: Diagnosis present

## 2014-07-17 DIAGNOSIS — I10 Essential (primary) hypertension: Secondary | ICD-10-CM | POA: Diagnosis not present

## 2014-07-17 DIAGNOSIS — D62 Acute posthemorrhagic anemia: Secondary | ICD-10-CM | POA: Diagnosis not present

## 2014-07-17 DIAGNOSIS — A419 Sepsis, unspecified organism: Secondary | ICD-10-CM | POA: Diagnosis present

## 2014-07-17 DIAGNOSIS — Z720 Tobacco use: Secondary | ICD-10-CM | POA: Diagnosis not present

## 2014-07-17 DIAGNOSIS — E46 Unspecified protein-calorie malnutrition: Secondary | ICD-10-CM | POA: Diagnosis not present

## 2014-07-17 DIAGNOSIS — S72002A Fracture of unspecified part of neck of left femur, initial encounter for closed fracture: Secondary | ICD-10-CM

## 2014-07-17 DIAGNOSIS — Y92230 Patient room in hospital as the place of occurrence of the external cause: Secondary | ICD-10-CM

## 2014-07-17 DIAGNOSIS — R1310 Dysphagia, unspecified: Secondary | ICD-10-CM | POA: Diagnosis not present

## 2014-07-17 DIAGNOSIS — J449 Chronic obstructive pulmonary disease, unspecified: Secondary | ICD-10-CM | POA: Diagnosis not present

## 2014-07-17 DIAGNOSIS — E860 Dehydration: Secondary | ICD-10-CM | POA: Diagnosis present

## 2014-07-17 DIAGNOSIS — S72141A Displaced intertrochanteric fracture of right femur, initial encounter for closed fracture: Secondary | ICD-10-CM | POA: Diagnosis not present

## 2014-07-17 DIAGNOSIS — J9620 Acute and chronic respiratory failure, unspecified whether with hypoxia or hypercapnia: Secondary | ICD-10-CM | POA: Diagnosis present

## 2014-07-17 DIAGNOSIS — E876 Hypokalemia: Secondary | ICD-10-CM | POA: Diagnosis present

## 2014-07-17 DIAGNOSIS — Z66 Do not resuscitate: Secondary | ICD-10-CM | POA: Diagnosis present

## 2014-07-17 DIAGNOSIS — Z825 Family history of asthma and other chronic lower respiratory diseases: Secondary | ICD-10-CM | POA: Diagnosis not present

## 2014-07-17 DIAGNOSIS — R0602 Shortness of breath: Secondary | ICD-10-CM | POA: Diagnosis present

## 2014-07-17 DIAGNOSIS — N179 Acute kidney failure, unspecified: Secondary | ICD-10-CM | POA: Diagnosis not present

## 2014-07-17 DIAGNOSIS — E43 Unspecified severe protein-calorie malnutrition: Secondary | ICD-10-CM | POA: Diagnosis present

## 2014-07-17 DIAGNOSIS — S72143A Displaced intertrochanteric fracture of unspecified femur, initial encounter for closed fracture: Secondary | ICD-10-CM | POA: Diagnosis present

## 2014-07-17 DIAGNOSIS — W19XXXA Unspecified fall, initial encounter: Secondary | ICD-10-CM

## 2014-07-17 DIAGNOSIS — R627 Adult failure to thrive: Secondary | ICD-10-CM | POA: Diagnosis present

## 2014-07-17 DIAGNOSIS — Z09 Encounter for follow-up examination after completed treatment for conditions other than malignant neoplasm: Secondary | ICD-10-CM

## 2014-07-17 DIAGNOSIS — F10239 Alcohol dependence with withdrawal, unspecified: Secondary | ICD-10-CM | POA: Diagnosis not present

## 2014-07-17 DIAGNOSIS — F419 Anxiety disorder, unspecified: Secondary | ICD-10-CM | POA: Diagnosis present

## 2014-07-17 DIAGNOSIS — R490 Dysphonia: Secondary | ICD-10-CM | POA: Diagnosis present

## 2014-07-17 DIAGNOSIS — Z419 Encounter for procedure for purposes other than remedying health state, unspecified: Secondary | ICD-10-CM

## 2014-07-17 DIAGNOSIS — J9602 Acute respiratory failure with hypercapnia: Secondary | ICD-10-CM | POA: Diagnosis not present

## 2014-07-17 DIAGNOSIS — K219 Gastro-esophageal reflux disease without esophagitis: Secondary | ICD-10-CM | POA: Diagnosis present

## 2014-07-17 DIAGNOSIS — R519 Headache, unspecified: Secondary | ICD-10-CM

## 2014-07-17 DIAGNOSIS — J690 Pneumonitis due to inhalation of food and vomit: Secondary | ICD-10-CM | POA: Diagnosis present

## 2014-07-17 DIAGNOSIS — W010XXA Fall on same level from slipping, tripping and stumbling without subsequent striking against object, initial encounter: Secondary | ICD-10-CM | POA: Diagnosis present

## 2014-07-17 DIAGNOSIS — F17210 Nicotine dependence, cigarettes, uncomplicated: Secondary | ICD-10-CM | POA: Diagnosis present

## 2014-07-17 DIAGNOSIS — Z431 Encounter for attention to gastrostomy: Secondary | ICD-10-CM

## 2014-07-17 DIAGNOSIS — Z681 Body mass index (BMI) 19 or less, adult: Secondary | ICD-10-CM | POA: Diagnosis not present

## 2014-07-17 DIAGNOSIS — Z800 Family history of malignant neoplasm of digestive organs: Secondary | ICD-10-CM

## 2014-07-17 DIAGNOSIS — R51 Headache: Secondary | ICD-10-CM

## 2014-07-17 LAB — I-STAT TROPONIN, ED: Troponin i, poc: 0 ng/mL (ref 0.00–0.08)

## 2014-07-17 LAB — URINALYSIS, ROUTINE W REFLEX MICROSCOPIC
BILIRUBIN URINE: NEGATIVE
Glucose, UA: NEGATIVE mg/dL
Ketones, ur: NEGATIVE mg/dL
Leukocytes, UA: NEGATIVE
Nitrite: NEGATIVE
PH: 6 (ref 5.0–8.0)
Protein, ur: NEGATIVE mg/dL
Specific Gravity, Urine: 1.016 (ref 1.005–1.030)
Urobilinogen, UA: 0.2 mg/dL (ref 0.0–1.0)

## 2014-07-17 LAB — I-STAT CG4 LACTIC ACID, ED
Lactic Acid, Venous: 3.62 mmol/L (ref 0.5–2.0)
Lactic Acid, Venous: 3.02 mmol/L (ref 0.5–2.0)

## 2014-07-17 LAB — SODIUM, URINE, RANDOM: Sodium, Ur: 76 mmol/L

## 2014-07-17 LAB — COMPREHENSIVE METABOLIC PANEL
ALBUMIN: 3 g/dL — AB (ref 3.5–5.0)
ALT: 18 U/L (ref 17–63)
ANION GAP: 9 (ref 5–15)
AST: 26 U/L (ref 15–41)
Alkaline Phosphatase: 76 U/L (ref 38–126)
BILIRUBIN TOTAL: 0.8 mg/dL (ref 0.3–1.2)
BUN: 44 mg/dL — ABNORMAL HIGH (ref 6–20)
CO2: 31 mmol/L (ref 22–32)
CREATININE: 1.34 mg/dL — AB (ref 0.61–1.24)
Calcium: 8.5 mg/dL — ABNORMAL LOW (ref 8.9–10.3)
Chloride: 94 mmol/L — ABNORMAL LOW (ref 101–111)
GFR calc Af Amer: >60 mL/min (ref >60)
GFR calc non Af Amer: 53 mL/min — ABNORMAL LOW (ref >60)
Glucose, Bld: 135 mg/dL — ABNORMAL HIGH (ref 65–99)
POTASSIUM: 3.7 mmol/L (ref 3.5–5.1)
Sodium: 134 mmol/L — ABNORMAL LOW (ref 135–145)
Total Protein: 7.2 g/dL (ref 6.5–8.1)

## 2014-07-17 LAB — CREATININE, URINE, RANDOM: CREATININE, URINE: 74.51 mg/dL

## 2014-07-17 LAB — CBC
HEMATOCRIT: 37.6 % — AB (ref 39.0–52.0)
Hemoglobin: 13.1 g/dL (ref 13.0–17.0)
MCH: 34.1 pg — AB (ref 26.0–34.0)
MCHC: 34.8 g/dL (ref 30.0–36.0)
MCV: 97.9 fL (ref 78.0–100.0)
Platelets: 251 10*3/uL (ref 150–400)
RBC: 3.84 MIL/uL — AB (ref 4.22–5.81)
RDW: 12.9 % (ref 11.5–15.5)
WBC: 11.1 10*3/uL — AB (ref 4.0–10.5)

## 2014-07-17 LAB — URINE MICROSCOPIC-ADD ON

## 2014-07-17 LAB — STREP PNEUMONIAE URINARY ANTIGEN: Strep Pneumo Urinary Antigen: NEGATIVE

## 2014-07-17 LAB — TROPONIN I: Troponin I: <0.03 ng/mL (ref <0.031)

## 2014-07-17 LAB — CK: Total CK: 167 U/L (ref 49–397)

## 2014-07-17 LAB — BRAIN NATRIURETIC PEPTIDE: B NATRIURETIC PEPTIDE 5: 220.5 pg/mL — AB (ref 0.0–100.0)

## 2014-07-17 MED ORDER — KCL IN DEXTROSE-NACL 20-5-0.45 MEQ/L-%-% IV SOLN
INTRAVENOUS | Status: DC
  Administered 2014-07-17 – 2014-07-18 (×2): via INTRAVENOUS
  Filled 2014-07-17 (×3): qty 1000

## 2014-07-17 MED ORDER — HYDRALAZINE HCL 20 MG/ML IJ SOLN
10.0000 mg | Freq: Four times a day (QID) | INTRAMUSCULAR | Status: DC | PRN
  Administered 2014-07-18 – 2014-07-27 (×5): 10 mg via INTRAVENOUS
  Filled 2014-07-17 (×7): qty 1

## 2014-07-17 MED ORDER — SODIUM CHLORIDE 0.9 % IV BOLUS (SEPSIS)
1000.0000 mL | Freq: Once | INTRAVENOUS | Status: AC
  Administered 2014-07-17: 1000 mL via INTRAVENOUS

## 2014-07-17 MED ORDER — BUDESONIDE-FORMOTEROL FUMARATE 160-4.5 MCG/ACT IN AERO
2.0000 | INHALATION_SPRAY | Freq: Two times a day (BID) | RESPIRATORY_TRACT | Status: DC
  Administered 2014-07-17 – 2014-07-30 (×22): 2 via RESPIRATORY_TRACT
  Filled 2014-07-17 (×2): qty 6

## 2014-07-17 MED ORDER — ONDANSETRON HCL 4 MG/2ML IJ SOLN: 4.0000 mg | Freq: Four times a day (QID) | INTRAMUSCULAR | Status: DC | PRN

## 2014-07-17 MED ORDER — ENOXAPARIN SODIUM 40 MG/0.4ML ~~LOC~~ SOLN
40.0000 mg | SUBCUTANEOUS | Status: DC
  Filled 2014-07-17: qty 0.4

## 2014-07-17 MED ORDER — DEXTROSE 5 % IV SOLN
1.0000 g | Freq: Once | INTRAVENOUS | Status: DC
  Administered 2014-07-17: 1 g via INTRAVENOUS
  Filled 2014-07-17: qty 10

## 2014-07-17 MED ORDER — DEXTROSE 5 % IV SOLN
500.0000 mg | INTRAVENOUS | Status: DC
  Administered 2014-07-18 – 2014-07-22 (×4): 500 mg via INTRAVENOUS
  Filled 2014-07-17 (×5): qty 500

## 2014-07-17 MED ORDER — LEVALBUTEROL HCL 1.25 MG/0.5ML IN NEBU
1.2500 mg | INHALATION_SOLUTION | Freq: Three times a day (TID) | RESPIRATORY_TRACT | Status: DC
  Administered 2014-07-18 – 2014-07-30 (×31): 1.25 mg via RESPIRATORY_TRACT
  Filled 2014-07-17 (×42): qty 0.5

## 2014-07-17 MED ORDER — ALBUTEROL SULFATE (2.5 MG/3ML) 0.083% IN NEBU
2.5000 mg | INHALATION_SOLUTION | RESPIRATORY_TRACT | Status: DC | PRN
  Administered 2014-07-17 – 2014-07-20 (×2): 2.5 mg via RESPIRATORY_TRACT
  Filled 2014-07-17 (×2): qty 3

## 2014-07-17 MED ORDER — ENOXAPARIN SODIUM 30 MG/0.3ML ~~LOC~~ SOLN
30.0000 mg | SUBCUTANEOUS | Status: DC
  Administered 2014-07-18: 30 mg via SUBCUTANEOUS
  Filled 2014-07-17 (×4): qty 0.3

## 2014-07-17 MED ORDER — ALPRAZOLAM 0.5 MG PO TABS: 0.5000 mg | ORAL_TABLET | Freq: Two times a day (BID) | ORAL | Status: DC | PRN

## 2014-07-17 MED ORDER — TIOTROPIUM BROMIDE MONOHYDRATE 18 MCG IN CAPS
18.0000 ug | ORAL_CAPSULE | Freq: Every day | RESPIRATORY_TRACT | Status: DC
  Administered 2014-07-18 – 2014-07-30 (×11): 18 ug via RESPIRATORY_TRACT
  Filled 2014-07-17 (×3): qty 5

## 2014-07-17 MED ORDER — NEBIVOLOL HCL 5 MG PO TABS
5.0000 mg | ORAL_TABLET | Freq: Every day | ORAL | Status: DC
  Administered 2014-07-17 – 2014-07-30 (×11): 5 mg via ORAL
  Filled 2014-07-17 (×15): qty 1

## 2014-07-17 MED ORDER — ACETAMINOPHEN 500 MG PO TABS: 500.0000 mg | ORAL_TABLET | Freq: Four times a day (QID) | ORAL | Status: DC | PRN

## 2014-07-17 MED ORDER — SODIUM CHLORIDE 0.9 % IV SOLN
3.0000 g | Freq: Once | INTRAVENOUS | Status: AC
  Administered 2014-07-17: 3 g via INTRAVENOUS
  Filled 2014-07-17: qty 3

## 2014-07-17 MED ORDER — LEVALBUTEROL HCL 1.25 MG/0.5ML IN NEBU
1.2500 mg | INHALATION_SOLUTION | Freq: Three times a day (TID) | RESPIRATORY_TRACT | Status: DC
  Administered 2014-07-17: 1.25 mg via RESPIRATORY_TRACT
  Filled 2014-07-17 (×2): qty 0.5

## 2014-07-17 MED ORDER — PNEUMOCOCCAL VAC POLYVALENT 25 MCG/0.5ML IJ INJ
0.5000 mL | INJECTION | INTRAMUSCULAR | Status: AC
  Administered 2014-07-21: 0.5 mL via INTRAMUSCULAR
  Filled 2014-07-17 (×3): qty 0.5

## 2014-07-17 MED ORDER — GUAIFENESIN ER 600 MG PO TB12: 600.0000 mg | ORAL_TABLET | Freq: Every day | ORAL | Status: DC | PRN

## 2014-07-17 MED ORDER — AZITHROMYCIN 500 MG IV SOLR: 500.0000 mg | INTRAVENOUS | Status: DC

## 2014-07-17 MED ORDER — DEXTROSE 5 % IV SOLN
500.0000 mg | Freq: Once | INTRAVENOUS | Status: AC
  Administered 2014-07-17: 500 mg via INTRAVENOUS
  Filled 2014-07-17: qty 500

## 2014-07-17 MED ORDER — ONDANSETRON HCL 4 MG/2ML IJ SOLN: 4.0000 mg | Freq: Three times a day (TID) | INTRAMUSCULAR | Status: AC | PRN

## 2014-07-17 MED ORDER — SODIUM CHLORIDE 0.9 % IV SOLN
1.5000 g | Freq: Three times a day (TID) | INTRAVENOUS | Status: DC
  Administered 2014-07-18 – 2014-07-19 (×4): 1.5 g via INTRAVENOUS
  Filled 2014-07-17 (×5): qty 1.5

## 2014-07-17 NOTE — ED Notes (Signed)
Nurse currently starting IV 

## 2014-07-17 NOTE — ED Notes (Signed)
Dr Miller made aware of abnormal lactic acid. 

## 2014-07-17 NOTE — Progress Notes (Signed)
ANTIBIOTIC CONSULT NOTE - INITIAL  Pharmacy Consult for Unasyn Indication: aspiration pneumonia  No Known Allergies  Patient Measurements:    Vital Signs: Temp: 98.7 F (37.1 C) (06/04 1331) Temp Source: Oral (06/04 1331) BP: 148/96 mmHg (06/04 1331) Pulse Rate: 84 (06/04 1331) Intake/Output from previous day:   Intake/Output from this shift:    Labs:  Recent Labs  07/17/14 1206  WBC 11.1*  HGB 13.1  PLT 251  CREATININE 1.34*   CrCl cannot be calculated (Unknown ideal weight.). No results for input(s): VANCOTROUGH, VANCOPEAK, VANCORANDOM, GENTTROUGH, GENTPEAK, GENTRANDOM, TOBRATROUGH, TOBRAPEAK, TOBRARND, AMIKACINPEAK, AMIKACINTROU, AMIKACIN in the last 72 hours.   Microbiology: No results found for this or any previous visit (from the past 720 hour(s)).  Medical History: Past Medical History  Diagnosis Date  . Inguinal hernia   . COPD (chronic obstructive pulmonary disease)   . GERD (gastroesophageal reflux disease)   . Chronic hoarseness   . Tobacco abuse   . Cervical spinal stenosis     C4-C5; C5-C6; severe stenosis and abnormal  cord signal C6-C7 s/p decompression in 04/2004    Medications:  Scheduled:  . enoxaparin (LOVENOX) injection  40 mg Subcutaneous Q24H   Infusions:  . ampicillin-sulbactam (UNASYN) IV    . azithromycin 500 mg (07/17/14 1450)  . [START ON 07/18/2014] azithromycin    . dextrose 5 % and 0.45 % NaCl with KCl 20 mEq/L     PRN: acetaminophen, albuterol, ondansetron (ZOFRAN) IV Anti-infectives    Start     Dose/Rate Route Frequency Ordered Stop   07/18/14 0000  azithromycin (ZITHROMAX) 500 mg in dextrose 5 % 250 mL IVPB     500 mg 250 mL/hr over 60 Minutes Intravenous Every 24 hours 07/17/14 1344     07/17/14 1500  Ampicillin-Sulbactam (UNASYN) 3 g in sodium chloride 0.9 % 100 mL IVPB     3 g 100 mL/hr over 60 Minutes Intravenous  Once 07/17/14 1432     07/17/14 1230  cefTRIAXone (ROCEPHIN) 1 g in dextrose 5 % 50 mL IVPB  Status:   Discontinued     1 g 100 mL/hr over 30 Minutes Intravenous  Once 07/17/14 1219 07/17/14 1416   07/17/14 1230  azithromycin (ZITHROMAX) 500 mg in dextrose 5 % 250 mL IVPB     500 mg 250 mL/hr over 60 Minutes Intravenous  Once 07/17/14 1219       Assessment: 69 y.o. male with PMH COPD presented  07/17/2014 after being found on floor w/o LOC.  Endorses progressive dysphagia x several years; thinks he aspirated on food about 1 wk ago and since developed productive cough with SOB.  Admitting for CAP/aspiration PNA.  Already received ceftriaxone/azithromycin x 1 in ED.  6/4 >> Ceftriaxone x 1 6/4 >> Azithromycin (MD) >>   6/4 >> Unasyn >>  Tmax: afebrile WBCs: sl elevated Renal: SCr 1.34; CrCl 33 ml/min  6/4 blood: IP 6/4 urine: IP 6/4 sputum: IP 6/4 S. pneumo UAg: IP 6/4 Legionella UAg: IP   Goal of Therapy:  Eradication of infection Appropriate antibiotic dosing for indication and renal function  Plan:  Day 1 antibiotics  Unasyn 3g IV x 1, then 1.5 g IV q8 hr  Follow clinical course, renal function, culture results as available.  Can adjust to q12 dosing if CrCl falls < 30  Follow for de-escalation of antibiotics and LOT   Bernadene Personrew Sharone Picchi, PharmD Pager: [phone] 07/17/2014, 3:18 PM

## 2014-07-17 NOTE — ED Notes (Signed)
Bed: WA18 Expected date:  Expected time:  Means of arrival:  Comments: triage 

## 2014-07-17 NOTE — ED Notes (Signed)
I have just phoned report to Phillip May on [address]--will transport shortly.

## 2014-07-17 NOTE — ED Provider Notes (Signed)
CSN: [id number]     Arrival date & time 07/17/14  1043 History   First MD Initiated Contact with Patient 07/17/14 1107     Chief Complaint  Patient presents with  . Shortness of Breath     (Consider location/radiation/quality/duration/timing/severity/associated sxs/prior Treatment) HPI Comments: 69 year old male who lives by himself, history of COPD and acid reflux, presents to the hospital with a complaint of generalized weakness and shortness of breath. The patient has had a chronic cough, he has been coughing up bloody-colored phlegm the last couple of days and has had 2 falls, one yesterday afternoon, found on the ground at 8:00 in the evening, this morning he was found on the ground again, it is unclear how long he was on the ground though he states it was just a couple of hours each time. He states that when he gets up to stand he gets too lightheaded and falls to the ground, he states that he tries to lower himself so that he does not actually fall. He denies any traumatic injuries, denies abdominal pain dysuria diarrhea or back pain though his caregiver states that yesterday he came to check on him and had an adult diaper full of dark colored fluids. The patient has not had any nausea or vomiting but has not had much to eat in 2-1/2 days despite food being brought to his house. This is gradually worsening, nothing seems to make this better, worse with standing, no associated rashes though he does complain of swelling in his bilateral ankles which is chronic.  Patient is a 69 y.o. male presenting with shortness of breath. The history is provided by the patient, a caregiver and medical records.  Shortness of Breath   Past Medical History  Diagnosis Date  . Inguinal hernia   . COPD (chronic obstructive pulmonary disease)   . GERD (gastroesophageal reflux disease)   . Chronic hoarseness   . Tobacco abuse   . Cervical spinal stenosis     C4-C5; C5-C6; severe stenosis and abnormal  cord  signal C6-C7 s/p decompression in 04/2004   Past Surgical History  Procedure Laterality Date  . Inguinal hernia repair    . Posterior laminectomy / decompression cervical spine  2006   Family History  Problem Relation Age of Onset  . Emphysema Father   . Stomach cancer Sister    History  Substance Use Topics  . Smoking status: Current Every Day Smoker -- 0.20 packs/day for 30 years    Types: Cigarettes  . Smokeless tobacco: Never Used     Comment: 3-4 cigs a day  . Alcohol Use: 7.0 oz/week    14 Standard drinks or equivalent per week     Comment: 3-4 shots of liquor a day    Review of Systems  Respiratory: Positive for shortness of breath.   All other systems reviewed and are negative.     Allergies  Review of patient's allergies indicates no known allergies.  Home Medications   Prior to Admission medications   Medication Sig Start Date End Date Taking? Authorizing Provider  AZOR 5-40 MG per tablet Take 1 tablet by mouth Daily. 02/19/11  Yes Historical Provider, MD  budesonide-formoterol (SYMBICORT) 160-4.5 MCG/ACT inhaler Inhale 2 puffs into the lungs 2 (two) times daily.   Yes Historical Provider, MD  guaiFENesin (MUCINEX) 600 MG 12 hr tablet Take 600 mg by mouth daily as needed for cough or to loosen phlegm.   Yes Historical Provider, MD  ALPRAZolam Prudy Feeler) 1 MG tablet  take 1 tab 30 minutes before MRI.  May repeat 1 more tab during MRI if required. Patient not taking: Reported on 07/17/2014 05/21/11   Milas GainMatthew H Wong, MD  tiotropium (SPIRIVA HANDIHALER) 18 MCG inhalation capsule Place 1 capsule (18 mcg total) into inhaler and inhale daily. 03/13/11 03/12/12  Kalman ShanMurali Ramaswamy, MD   BP 148/96 mmHg  Pulse 84  Temp(Src) 98.7 F (37.1 C) (Oral)  Resp 21  SpO2 99% Physical Exam  Constitutional: He appears well-developed and well-nourished. No distress.  HENT:  Head: Normocephalic and atraumatic.  Mouth/Throat: Oropharynx is clear and moist. No oropharyngeal exudate.  Eyes:  Conjunctivae and EOM are normal. Pupils are equal, round, and reactive to light. Right eye exhibits no discharge. Left eye exhibits no discharge. No scleral icterus.  Neck: Normal range of motion. Neck supple. No JVD present. No thyromegaly present.  Cardiovascular: Normal rate, regular rhythm, normal heart sounds and intact distal pulses.  Exam reveals no gallop and no friction rub.   No murmur heard. Pulmonary/Chest: Effort normal and breath sounds normal. No respiratory distress. He has no wheezes. He has no rales.  Frequent coughing, speaks in full sentences  Abdominal: Soft. Bowel sounds are normal. He exhibits no distension and no mass. There is no tenderness.  Musculoskeletal: Normal range of motion. He exhibits no edema or tenderness.  Lymphadenopathy:    He has no cervical adenopathy.  Neurological: He is alert. Coordination normal.  Diffuse muscle wasting, normal strength in all 4 extremities, appears tired but has normal coordination and strength of upper and lower extremities, speech is clear  Skin: Skin is warm and dry. No rash noted. No erythema.  Psychiatric: He has a normal mood and affect. His behavior is normal.  Nursing note and vitals reviewed.   ED Course  Procedures (including critical care time) Labs Review Labs Reviewed  CBC - Abnormal; Notable for the following:    WBC 11.1 (*)    RBC 3.84 (*)    HCT 37.6 (*)    MCH 34.1 (*)    All other components within normal limits  COMPREHENSIVE METABOLIC PANEL - Abnormal; Notable for the following:    Sodium 134 (*)    Chloride 94 (*)    Glucose, Bld 135 (*)    BUN 44 (*)    Creatinine, Ser 1.34 (*)    Calcium 8.5 (*)    Albumin 3.0 (*)    GFR calc non Af Amer 53 (*)    All other components within normal limits  BRAIN NATRIURETIC PEPTIDE - Abnormal; Notable for the following:    B Natriuretic Peptide 220.5 (*)    All other components within normal limits  I-STAT CG4 LACTIC ACID, ED - Abnormal; Notable for the  following:    Lactic Acid, Venous 3.62 (*)    All other components within normal limits  CULTURE, BLOOD (ROUTINE X 2)  CULTURE, BLOOD (ROUTINE X 2)  TROPONIN I  CK  URINALYSIS, ROUTINE W REFLEX MICROSCOPIC (NOT [facility])  Rosezena SensorI-STAT TROPOININ, ED    Imaging Review Dg Chest Port 1 View  07/17/2014   CLINICAL DATA:  Pt found on kitchen floor. Believed to be there for approx 4 hrs. Sts he fell but sat himself down, wasn't a "crash." Complains of sob and congestion x3 days. Was too weak to stand up from fall. Has been taking mucinex. Per neighbor, he started coughing up blood clots yesterday.  EXAM: PORTABLE CHEST - 1 VIEW  COMPARISON:  04/27/2004  FINDINGS: There  is dense consolidation in knee right lower lung, projecting in the right lower lobe. This is new from the prior study.  Lungs are hyperexpanded consistent with underlying COPD. No other lung consolidation. No pulmonary edema. No pleural effusion or pneumothorax.  Cardiac silhouette is normal in size. Aorta is uncoiled. No mediastinal or hilar masses or convincing adenopathy.  Bony thorax is demineralized but intact.  IMPRESSION: Right lower lobe consolidation consistent with pneumonia. Underlying COPD.   Electronically Signed   By: Amie Portland M.D.   On: 07/17/2014 11:58    ,ED ECG REPORT  I personally interpreted this EKG   Date: 07/17/2014   Rate: 88  Rhythm: normal sinus rhythm  QRS Axis: left  Intervals: normal  ST/T Wave abnormalities: nonspecific T wave changes  Conduction Disutrbances:none  Narrative Interpretation:   Old EKG Reviewed: none available   MDM   Final diagnoses:  SOB (shortness of breath)  CAP (community acquired pneumonia)  AKI (acute kidney injury)    2-1/2 days of not eating, generalized weakness, now too weak to stand, question possible severe dehydration, renal failure, urinary tract infection, worsening respiratory function despite having oxygen of 100% on low-dose oxygen possibly developing pneumonia.  X-rays labs fluids.  Angiocath insertion Performed by: Vida Roller  Consent: Verbal consent obtained. Risks and benefits: risks, benefits and alternatives were discussed Time out: Immediately prior to procedure a "time out" was called to verify the correct patient, procedure, equipment, support staff and site/side marked as required.  Preparation: Patient was prepped and draped in the usual sterile fashion.  Vein Location: L forearm  Not Ultrasound Guided  Gauge: 20  Normal blood return and flush without difficulty Patient tolerance: Patient tolerated the procedure well with no immediate complications.     Discussed with Dr. Thedore Mins, he will admit the patient to the hospital. Chest x-ray shows right lower lobe pneumonia, possibly aspiration, slight leukocytosis and renal insufficiency which is also likely given the patient's BUN and lack of fluid intake.  Eber Hong, MD 07/17/14 1344

## 2014-07-17 NOTE — ED Notes (Signed)
Pt's neighbor takes care of pt helping with ADLs. Could not get in contact with pt by phone, had his mother go check on pt. Pt found on kitchen floor. Believed to be there for approx 4 hrs. Sts he fell but sat himself down, wasn't a "crash." C/o sob and congestion x3 days. Was too weak to stand up from fall. Has been taking mucinex. Per neighbor, he started coughing up blood clots yesterday.

## 2014-07-17 NOTE — H&P (Signed)
Patient Demographics  Phillip May, is a 69 y.o. male  MRN: [id number]   DOB - April 16, 1945  Admit Date - 07/17/2014  Outpatient Primary MD for the patient is Londell Moh, MD   With History of -  Past Medical History  Diagnosis Date  . Inguinal hernia   . COPD (chronic obstructive pulmonary disease)   . GERD (gastroesophageal reflux disease)   . Chronic hoarseness   . Tobacco abuse   . Cervical spinal stenosis     C4-C5; C5-C6; severe stenosis and abnormal  cord signal C6-C7 s/p decompression in 04/2004      Past Surgical History  Procedure Laterality Date  . Inguinal hernia repair    . Posterior laminectomy / decompression cervical spine  2006    in for   Chief Complaint  Patient presents with  . Shortness of Breath     HPI  Phillip May  is a 69 y.o. male, history of COPD follows with Dr. Marchelle Gearing not on oxygen, ongoing smoking, occasional alcohol use, history of dysphagia on a soft diet per speech therapy eval in 2014, chronic hoarse voice, GERD, generalized weakness and deconditioning. Who lives at home by himself with help from neighbors and getting groceries and activities of daily living.  Patient was brought into the ER after a neighbor found him on the floor of his kitchen, and apparently patient was feeling weak and sat himself on the floor, he did not hit the floor or hurt himself. He states that he has been gradually developing dysphagia over the course of few years, likely aspirated and choked on food about a week ago, since then has had a productive cough with some shortness of breath, no fevers, has been not eating or drinking well since that time and getting gradually weak. Finally slumped to the floor as above and was brought to the ER.  In the ER workups at assistive of  acute on chronic respiratory failure due to aspiration/community-acquired pneumonia. He also had signs of dehydration with elevated lactate/early sepsis. I was called to admit the patient.   Denies any fever or chills, no headache, no chest pain palpitations, cough and shortness of breath as above, no abdominal pain or discomfort, no dysuria, no blood in stool or urine. No intentional weight loss. No focal weakness. However he severely frail and generalized weakness is present on a chronic basis. Uses a walker to ambulate.    Review of Systems    In addition to the HPI above,   No Fever-chills, No Headache, No changes with Vision or hearing, +ve problems swallowing food or Liquids, No Chest pain,++ productive Cough & Shortness of Breath, No Abdominal pain, No Nausea or Vommitting, Bowel movements are regular, No Blood in stool or Urine, No dysuria, No new skin rashes or bruises, No new joints pains-aches,  No new weakness, tingling, numbness in any extremity, No recent weight gain or loss, No polyuria, polydypsia  or polyphagia, No significant Mental Stressors.  A full 10 point Review of Systems was done, except as stated above, all other Review of Systems were negative.   Social History History  Substance Use Topics  . Smoking status: Current Every Day Smoker -- 0.20 packs/day for 30 years    Types: Cigarettes  . Smokeless tobacco: Never Used     Comment: 3-4 cigs a day  . Alcohol Use: 7.0 oz/week    14 Standard drinks or equivalent per week     Comment: 3-4 shots of liquor a day    Lives - himself, ambulates with a walker, is generally quite weak and his neighbors help him get groceries and with most of his chores.        Family History Family History  Problem Relation Age of Onset  . Emphysema Father   . Stomach cancer Sister       Prior to Admission medications   Medication Sig Start Date End Date Taking? Authorizing Provider  AZOR 5-40 MG per tablet Take 1  tablet by mouth Daily. 02/19/11  Yes Historical Provider, MD  budesonide-formoterol (SYMBICORT) 160-4.5 MCG/ACT inhaler Inhale 2 puffs into the lungs 2 (two) times daily.   Yes Historical Provider, MD  guaiFENesin (MUCINEX) 600 MG 12 hr tablet Take 600 mg by mouth daily as needed for cough or to loosen phlegm.   Yes Historical Provider, MD  ALPRAZolam Prudy Feeler(XANAX) 1 MG tablet take 1 tab 30 minutes before MRI.  May repeat 1 more tab during MRI if required. Patient not taking: Reported on 07/17/2014 05/21/11   Milas GainMatthew H Wong, MD  tiotropium (SPIRIVA HANDIHALER) 18 MCG inhalation capsule Place 1 capsule (18 mcg total) into inhaler and inhale daily. 03/13/11 03/12/12  Kalman ShanMurali Ramaswamy, MD    No Known Allergies  Physical Exam  Vitals  Blood pressure 148/96, pulse 84, temperature 98.7 F (37.1 C), temperature source Oral, resp. rate 21, SpO2 99 %.   1. General frail elderly African-American male lying in bed in mild shortness of breath has a productive cough,  2. Normal affect and insight, Not Suicidal or Homicidal, Awake Alert, Oriented X 3.  3. No F.N deficits, ALL C.Nerves Intact, Strength 5/5 all 4 extremities, Sensation intact all 4 extremities, Plantars down going.  4. Ears and Eyes appear Normal, Conjunctivae clear, PERRLA. Moist Oral Mucosa.  5. Supple Neck, No JVD, No cervical lymphadenopathy appriciated, No Carotid Bruits.  6. Symmetrical Chest wall movement, Good air movement bilaterally, Coarse Bilat B sounds  7. RRR, No Gallops, Rubs or Murmurs, No Parasternal Heave.  8. Positive Bowel Sounds, Abdomen Soft, No tenderness, No organomegaly appriciated,No rebound -guarding or rigidity.  9.  No Cyanosis, Normal Skin Turgor, No Skin Rash or Bruise.  10. Good muscle tone,  joints appear normal , no effusions, Normal ROM.  11. No Palpable Lymph Nodes in Neck or Axillae     Data Review  CBC  Recent Labs Lab 07/17/14 1206  WBC 11.1*  HGB 13.1  HCT 37.6*  PLT 251  MCV 97.9  MCH  34.1*  MCHC 34.8  RDW 12.9   ------------------------------------------------------------------------------------------------------------------  Chemistries   Recent Labs Lab 07/17/14 1206  NA 134*  K 3.7  CL 94*  CO2 31  GLUCOSE 135*  BUN 44*  CREATININE 1.34*  CALCIUM 8.5*  AST 26  ALT 18  ALKPHOS 76  BILITOT 0.8   ------------------------------------------------------------------------------------------------------------------ CrCl cannot be calculated (Unknown ideal weight.). ------------------------------------------------------------------------------------------------------------------ No results for input(s): TSH, T4TOTAL, T3FREE, THYROIDAB in  the last 72 hours.  Invalid input(s): FREET3   Coagulation profile No results for input(s): INR, PROTIME in the last 168 hours. ------------------------------------------------------------------------------------------------------------------- No results for input(s): DDIMER in the last 72 hours. -------------------------------------------------------------------------------------------------------------------  Cardiac Enzymes  Recent Labs Lab 07/17/14 1206  TROPONINI <0.03   ------------------------------------------------------------------------------------------------------------------ Invalid input(s): POCBNP   ---------------------------------------------------------------------------------------------------------------  Urinalysis No results found for: COLORURINE, APPEARANCEUR, LABSPEC, PHURINE, GLUCOSEU, HGBUR, BILIRUBINUR, KETONESUR, PROTEINUR, UROBILINOGEN, NITRITE, LEUKOCYTESUR  ----------------------------------------------------------------------------------------------------------------  Imaging results:   Dg Chest Port 1 View  07/17/2014   CLINICAL DATA:  Pt found on kitchen floor. Believed to be there for approx 4 hrs. Sts he fell but sat himself down, wasn't a "crash." Complains of sob and  congestion x3 days. Was too weak to stand up from fall. Has been taking mucinex. Per neighbor, he started coughing up blood clots yesterday.  EXAM: PORTABLE CHEST - 1 VIEW  COMPARISON:  04/27/2004  FINDINGS: There is dense consolidation in knee right lower lung, projecting in the right lower lobe. This is new from the prior study.  Lungs are hyperexpanded consistent with underlying COPD. No other lung consolidation. No pulmonary edema. No pleural effusion or pneumothorax.  Cardiac silhouette is normal in size. Aorta is uncoiled. No mediastinal or hilar masses or convincing adenopathy.  Bony thorax is demineralized but intact.  IMPRESSION: Right lower lobe consolidation consistent with pneumonia. Underlying COPD.   Electronically Signed   By: Amie Portland M.D.   On: 07/17/2014 11:58    My personal review of EKG: Rhythm NSR, Rate 88 /min, non specific ST changes    Assessment & Plan   1. Acute on chronic respiratory failure due to aspiration/committee acquired pneumonia. Will be admitted to a MedSurg bed, continuous pulse ox monitoring, blood and sputum cultures, aspiration precautions, IV Unasyn along with azithromycin. Follow cultures. Continue supportive care with oxygen and nebulizer treatments.    2. Chronic dysphagia. Now evidence of aspiration. Nothing by mouth except medications, speech to eval.     3. Sepsis as in #1 above, IV fluids with bolus and maintenance. Repeat lactate in the morning.    4. History of COPD and smoking. No wheezing, supportive care as in #1 above. Counseled to quit smoking.    5. Generalized weakness and deconditioning. PT eval, may require placement.     6. Dehydration. Obtain urine electrolytes, hydrate, monitor.    7. Chronic anxiety. Home dose and as continue.      DVT Prophylaxis    Lovenox   AM Labs Ordered, also please review Full Orders  Family Communication: Admission, patients condition and plan of care including tests being ordered  have been discussed with the patient   who indicates understanding and agree with the plan and Code Status.  Code Status DNR  Likely DC to  SNF  Condition GUARDED     Time spent in minutes : 35    Kaye Mitro K M.D on 07/17/2014 at 1:58 PM  Between 7am to 7pm - Pager - [phone]  After 7pm go to [email or web address] - password [email or web address]  [facility]  Office  [phone]

## 2014-07-17 NOTE — ED Notes (Signed)
TRIED GETTING BLOOD BUT WAS ONLY ABLE TO GET A LITTLE BIT

## 2014-07-18 LAB — URINE CULTURE
COLONY COUNT: NO GROWTH
Culture: NO GROWTH

## 2014-07-18 LAB — CBC
HCT: 35.1 % — ABNORMAL LOW (ref 39.0–52.0)
HEMOGLOBIN: 12 g/dL — AB (ref 13.0–17.0)
MCH: 33.8 pg (ref 26.0–34.0)
MCHC: 34.2 g/dL (ref 30.0–36.0)
MCV: 98.9 fL (ref 78.0–100.0)
Platelets: 227 10*3/uL (ref 150–400)
RBC: 3.55 MIL/uL — AB (ref 4.22–5.81)
RDW: 13 % (ref 11.5–15.5)
WBC: 7.9 10*3/uL (ref 4.0–10.5)

## 2014-07-18 LAB — BASIC METABOLIC PANEL
Anion gap: 10 (ref 5–15)
BUN: 24 mg/dL — ABNORMAL HIGH (ref 6–20)
CHLORIDE: 96 mmol/L — AB (ref 101–111)
CO2: 30 mmol/L (ref 22–32)
Calcium: 8.1 mg/dL — ABNORMAL LOW (ref 8.9–10.3)
Creatinine, Ser: 0.88 mg/dL (ref 0.61–1.24)
GFR calc Af Amer: >60 mL/min (ref >60)
GFR calc non Af Amer: >60 mL/min (ref >60)
Glucose, Bld: 131 mg/dL — ABNORMAL HIGH (ref 65–99)
Potassium: 3.4 mmol/L — ABNORMAL LOW (ref 3.5–5.1)
Sodium: 136 mmol/L (ref 135–145)

## 2014-07-18 LAB — OSMOLALITY: Osmolality: 293 mOsm/kg (ref 275–300)

## 2014-07-18 LAB — LACTIC ACID, PLASMA: LACTIC ACID, VENOUS: 1.6 mmol/L (ref 0.5–2.0)

## 2014-07-18 LAB — OSMOLALITY, URINE: Osmolality, Ur: 587 mOsm/kg (ref 390–1090)

## 2014-07-18 MED ORDER — POTASSIUM CHLORIDE IN NACL 40-0.9 MEQ/L-% IV SOLN
INTRAVENOUS | Status: DC
  Administered 2014-07-18 – 2014-07-19 (×2): 75 mL/h via INTRAVENOUS
  Filled 2014-07-18 (×2): qty 1000

## 2014-07-18 MED ORDER — MORPHINE SULFATE 2 MG/ML IJ SOLN
1.0000 mg | INTRAMUSCULAR | Status: DC | PRN
  Administered 2014-07-18: 2 mg via INTRAVENOUS
  Filled 2014-07-18: qty 1

## 2014-07-18 MED ORDER — ONDANSETRON HCL 4 MG/2ML IJ SOLN: 4.0000 mg | Freq: Three times a day (TID) | INTRAMUSCULAR | Status: DC | PRN

## 2014-07-18 NOTE — Progress Notes (Signed)
[facility] labs reported serum osmololity 293. On call provider notified.

## 2014-07-18 NOTE — Progress Notes (Signed)
Reported by lab, patient refused labs to be drawn. Lab will attempted again later.

## 2014-07-18 NOTE — Progress Notes (Addendum)
Mouth care done, pt brushed his teeth and removed his partial. Partial is in denture cup on his table. Moisturizer applied to lips. Pt continues to cough up tanish thick sputum. O2 @2L  via [place]. Visitor in with pt. Pt instructed he needs to continue being NPO. More tests are scheduled for 07/19/14. Failed speech eval. today.  Ice chips offered. C/O being hungry.

## 2014-07-18 NOTE — Evaluation (Signed)
Clinical/Bedside Swallow Evaluation Patient Details  Name: Phillip May MRN: [id number] Date of Birth: [birth year]  Today's Date: 07/18/2014 Time: SLP Start Time (ACUTE ONLY): 1250 SLP Stop Time (ACUTE ONLY): 1306 SLP Time Calculation (min) (ACUTE ONLY): 16 min  Past Medical History:  Past Medical History  Diagnosis Date  . Inguinal hernia   . COPD (chronic obstructive pulmonary disease)   . GERD (gastroesophageal reflux disease)   . Chronic hoarseness   . Tobacco abuse   . Cervical spinal stenosis     C4-C5; C5-C6; severe stenosis and abnormal  cord signal C6-C7 s/p decompression in 04/2004   Past Surgical History:  Past Surgical History  Procedure Laterality Date  . Inguinal hernia repair    . Posterior laminectomy / decompression cervical spine  2006   HPI:  Phillip May is a 69 y.o. male, history of COPD (not on oxygen), ongoing smoking, occasional alcohol use, history of dysphagia on a soft diet per MBS in September 2014, chronic hoarse voice, GERD, generalized weakness and deconditioning.  He lives at home alone and receives help from neighbors for getting groceries and activities of daily living.  The MBS in September 2014 did show trace aspiration of thins and penetration of nectar liquids.     Assessment / Plan / Recommendation Clinical Impression  Clinical swallow evaluation was completed.  Of note, the patient had previous MBS 10/29/2102 with recommendation for Dysphagia 3 and thin liquids.  Trace aspiration of thins and penetration of nectar were noted during this study.  During evaluation today, the patient presented with symptoms of pharyngeal dysphagia characterized by delayed swallow trigger, decreased hyo-laryngeal excursion and suspected pharyngeal weakness due to need for multiple swallows.  The patient presented with positive clinical s/s of aspiration characterized by immediate throat clear across textures and delayed wet cough.  Recommend keep the patient NPO,  including alternate means for medications and proceed with MBS to determine safest PO diet.     Aspiration Risk  Moderate    Diet Recommendation NPO   Medication Administration: Via alternative means    Other  Recommendations Oral Care Recommendations: Oral care QID     Swallow Study Prior Functional Status  Type of Home: House    General Date of Onset: 07/17/14 Other Pertinent Information: Phillip May is a 69 y.o. male, history of COPD (not on oxygen), ongoing smoking, occasional alcohol use, history of dysphagia on a soft diet per MBS in September 2014, chronic hoarse voice, GERD, generalized weakness and deconditioning.  He lives at home alone and receives help from neighbors for getting groceries and activities of daily living.  The MBS in September 2014 did show trace aspiration of thins and penetration of nectar liquids.   Type of Study: Bedside swallow evaluation Previous Swallow Assessment: 10/28/2012 Diet Prior to this Study: NPO Temperature Spikes Noted: No Respiratory Status: Supplemental O2 delivered via (comment) History of Recent Intubation: No Behavior/Cognition: Alert;Cooperative Self-Feeding Abilities: Needs assist Patient Positioning: Upright in bed Baseline Vocal Quality: Wet Volitional Cough: Strong Volitional Swallow: Able to elicit    Oral/Motor/Sensory Function Overall Oral Motor/Sensory Function: Appears within functional limits for tasks assessed Labial ROM: Within Functional Limits Labial Symmetry: Within Functional Limits Labial Strength: Within Functional Limits Lingual ROM: Within Functional Limits Lingual Symmetry: Within Functional Limits Lingual Strength: Within Functional Limits Facial ROM: Within Functional Limits Facial Symmetry: Within Functional Limits Facial Strength: Within Functional Limits Mandible: Within Functional Limits   Ice Chips Ice chips: Not tested   Thin Liquid Thin  Liquid: Impaired Presentation: Spoon Pharyngeal   Phase Impairments: Suspected delayed Swallow;Decreased hyoid-laryngeal movement;Throat Clearing - Immediate;Multiple swallows    Nectar Thick Nectar Thick Liquid: Not tested   Honey Thick Honey Thick Liquid: Not tested   Puree Puree: Impaired Presentation: Spoon Pharyngeal Phase Impairments: Suspected delayed Swallow;Decreased hyoid-laryngeal movement;Multiple swallows;Throat Clearing - Immediate;Cough - Delayed   Solid   GO    Solid: Not tested       Dimas AguasGoodman, Donyelle Enyeart N 07/18/2014,1:15 PM Dimas AguasMelissa Dameir Gentzler, MA, CCC-SLP Acute Rehab SLP [phone]

## 2014-07-18 NOTE — Progress Notes (Signed)
Patient Demographics  Phillip May, is a 69 y.o. male, DOB - [birth year], HQI:[id number]  Admit date - 07/17/2014   Admitting Physician Leroy Sea, MD  Outpatient Primary MD for the patient is Londell Moh, MD  LOS - 1   Chief Complaint  Patient presents with  . Shortness of Breath        Subjective:   [place] today has, No headache, No chest pain, No abdominal pain - No Nausea, No new weakness tingling or numbness, much improved Cough - SOB.   Assessment & Plan    1. Acute on chronic respiratory failure due to aspiration/committee acquired pneumonia. Much improved after being nothing by mouth and with empiric anti-biotics which include Unasyn and azithromycin, speech to evaluate, monitor cultures. Continue supportive care with oxygen and nebulizer treatments. Will evaluate for home oxygen need.    2. Chronic dysphagia. Now evidence of aspiration. Nothing by mouth except medications, speech to eval.     3. Sepsis as in #1 above, resolved after IV fluids with bolus and maintenance. Repeat lactate is normal.    4. History of COPD and smoking. No wheezing, supportive care as in #1 above. Counseled to quit smoking.    5. Generalized weakness and deconditioning. PT eval, may require placement.     6. Dehydration. Clinically resolved after hydration.    7. Chronic anxiety. Home dose and as continue.   8. Hypokalemia. Replaced, recheck in the morning with magnesium.     Code Status: DNR  Family Communication: None present  Disposition Plan: TBD   Consults Speech   Procedures     DVT Prophylaxis  Lovenox    Lab Results  Component Value Date   PLT 227 07/18/2014    Medications  Scheduled Meds: . ampicillin-sulbactam (UNASYN) IV  1.5 g  Intravenous Q8H  . azithromycin  500 mg Intravenous Q24H  . budesonide-formoterol  2 puff Inhalation BID  . enoxaparin (LOVENOX) injection  30 mg Subcutaneous Q24H  . levalbuterol  1.25 mg Nebulization TID  . nebivolol  5 mg Oral Daily  . pneumococcal 23 valent vaccine  0.5 mL Intramuscular Tomorrow-1000  . tiotropium  18 mcg Inhalation Daily   Continuous Infusions: . 0.9 % NaCl with KCl 40 mEq / L     PRN Meds:.acetaminophen, albuterol, ALPRAZolam, guaiFENesin, hydrALAZINE, ondansetron (ZOFRAN) IV  Antibiotics     Anti-infectives    Start     Dose/Rate Route Frequency Ordered Stop   07/18/14 1400  azithromycin (ZITHROMAX) 500 mg in dextrose 5 % 250 mL IVPB     500 mg 250 mL/hr over 60 Minutes Intravenous Every 24 hours 07/17/14 1612     07/18/14 0200  ampicillin-sulbactam (UNASYN) 1.5 g in sodium chloride 0.9 % 50 mL IVPB     1.5 g 100 mL/hr over 30 Minutes Intravenous Every 8 hours 07/17/14 1621     07/18/14 0000  azithromycin (ZITHROMAX) 500 mg in dextrose 5 % 250 mL IVPB  Status:  Discontinued     500 mg 250 mL/hr over 60 Minutes Intravenous Every 24 hours 07/17/14 1344 07/17/14 1612   07/17/14 1500  Ampicillin-Sulbactam (UNASYN) 3 g in sodium chloride 0.9 % 100 mL IVPB     3 g 100 mL/hr  over 60 Minutes Intravenous  Once 07/17/14 1432 07/17/14 1900   07/17/14 1230  cefTRIAXone (ROCEPHIN) 1 g in dextrose 5 % 50 mL IVPB  Status:  Discontinued     1 g 100 mL/hr over 30 Minutes Intravenous  Once 07/17/14 1219 07/17/14 1416   07/17/14 1230  azithromycin (ZITHROMAX) 500 mg in dextrose 5 % 250 mL IVPB     500 mg 250 mL/hr over 60 Minutes Intravenous  Once 07/17/14 1219 07/17/14 1550        Objective:   Filed Vitals:   07/17/14 2106 07/17/14 2130 07/18/14 0457 07/18/14 0850  BP:  130/87 141/93   Pulse:  81 70   Temp:  98.7 F (37.1 C) 97.1 F (36.2 C)   TempSrc:  Oral Oral   Resp:  16 16   Height:      Weight:      SpO2: 98% 94% 100% 100%    Wt Readings from Last 3  Encounters:  07/17/14 44.3 kg (97 lb 10.6 oz)  05/21/11 57.153 kg (126 lb)  04/23/11 59.421 kg (131 lb)     Intake/Output Summary (Last 24 hours) at 07/18/14 1029 Last data filed at 07/18/14 0500  Gross per 24 hour  Intake      0 ml  Output    500 ml  Net   -500 ml     Physical Exam  Awake Alert, Oriented X 3, No new F.N deficits, Normal affect [place].AT,PERRAL Supple Neck,No JVD, No cervical lymphadenopathy appriciated.  Symmetrical Chest wall movement, Good air movement bilaterally, few R Basilar rales RRR,No Gallops,Rubs or new Murmurs, No Parasternal Heave +ve B.Sounds, Abd Soft, No tenderness, No organomegaly appriciated, No rebound - guarding or rigidity. No Cyanosis, Clubbing or edema, No new Rash or bruise    Data Review   Micro Results Recent Results (from the past 240 hour(s))  Blood culture (routine x 2)     Status: None (Preliminary result)   Collection Time: 07/17/14  1:28 PM  Result Value Ref Range Status   Specimen Description BLOOD LEFT HAND  Final   Special Requests BOTTLES DRAWN AEROBIC ONLY 2CC  Final   Culture   Final           BLOOD CULTURE RECEIVED NO GROWTH TO DATE CULTURE WILL BE HELD FOR 5 DAYS BEFORE ISSUING A FINAL NEGATIVE REPORT Note: Culture results may be compromised due to an inadequate volume of blood received in culture bottles. Performed at [facility]    Report Status PENDING  Incomplete  Blood culture (routine x 2)     Status: None (Preliminary result)   Collection Time: 07/17/14  1:39 PM  Result Value Ref Range Status   Specimen Description BLOOD RIGHT HAND  Final   Special Requests BOTTLES DRAWN AEROBIC ONLY 2CC  Final   Culture   Final           BLOOD CULTURE RECEIVED NO GROWTH TO DATE CULTURE WILL BE HELD FOR 5 DAYS BEFORE ISSUING A FINAL NEGATIVE REPORT Performed at [facility]    Report Status PENDING  Incomplete  Culture, respiratory (NON-Expectorated)     Status: None (Preliminary result)   Collection  Time: 07/17/14  5:00 PM  Result Value Ref Range Status   Specimen Description SPUTUM  Final   Special Requests NONE  Final   Gram Stain PENDING  Incomplete   Culture   Final    Culture reincubated for better growth Performed at [facility]  Report Status PENDING  Incomplete    Radiology Reports Dg Chest Port 1 View  07/17/2014   CLINICAL DATA:  Pt found on kitchen floor. Believed to be there for approx 4 hrs. Sts he fell but sat himself down, wasn't a "crash." Complains of sob and congestion x3 days. Was too weak to stand up from fall. Has been taking mucinex. Per neighbor, he started coughing up blood clots yesterday.  EXAM: PORTABLE CHEST - 1 VIEW  COMPARISON:  04/27/2004  FINDINGS: There is dense consolidation in knee right lower lung, projecting in the right lower lobe. This is new from the prior study.  Lungs are hyperexpanded consistent with underlying COPD. No other lung consolidation. No pulmonary edema. No pleural effusion or pneumothorax.  Cardiac silhouette is normal in size. Aorta is uncoiled. No mediastinal or hilar masses or convincing adenopathy.  Bony thorax is demineralized but intact.  IMPRESSION: Right lower lobe consolidation consistent with pneumonia. Underlying COPD.   Electronically Signed   By: Amie Portland M.D.   On: 07/17/2014 11:58     CBC  Recent Labs Lab 07/17/14 1206 07/18/14 0805  WBC 11.1* 7.9  HGB 13.1 12.0*  HCT 37.6* 35.1*  PLT 251 227  MCV 97.9 98.9  MCH 34.1* 33.8  MCHC 34.8 34.2  RDW 12.9 13.0    Chemistries   Recent Labs Lab 07/17/14 1206 07/18/14 0805  NA 134* 136  K 3.7 3.4*  CL 94* 96*  CO2 31 30  GLUCOSE 135* 131*  BUN 44* 24*  CREATININE 1.34* 0.88  CALCIUM 8.5* 8.1*  AST 26  --   ALT 18  --   ALKPHOS 76  --   BILITOT 0.8  --    ------------------------------------------------------------------------------------------------------------------ estimated creatinine clearance is 50.3 mL/min (by C-G formula based  on Cr of 0.88). ------------------------------------------------------------------------------------------------------------------ No results for input(s): HGBA1C in the last 72 hours. ------------------------------------------------------------------------------------------------------------------ No results for input(s): CHOL, HDL, LDLCALC, TRIG, CHOLHDL, LDLDIRECT in the last 72 hours. ------------------------------------------------------------------------------------------------------------------ No results for input(s): TSH, T4TOTAL, T3FREE, THYROIDAB in the last 72 hours.  Invalid input(s): FREET3 ------------------------------------------------------------------------------------------------------------------ No results for input(s): VITAMINB12, FOLATE, FERRITIN, TIBC, IRON, RETICCTPCT in the last 72 hours.  Coagulation profile No results for input(s): INR, PROTIME in the last 168 hours.  No results for input(s): DDIMER in the last 72 hours.  Cardiac Enzymes  Recent Labs Lab 07/17/14 1206  TROPONINI <0.03   ------------------------------------------------------------------------------------------------------------------ Invalid input(s): POCBNP   Time Spent in minutes   35   Arslan Kier K M.D on 07/18/2014 at 10:29 AM  Between 7am to 7pm - Pager - [phone]  After 7pm go to [email or web address] - password [facility]  [facility]   Office  [phone]

## 2014-07-18 NOTE — Clinical Social Work Note (Signed)
Clinical Social Work Assessment  Patient Details  Name: Phillip May MRN: [id number] Date of Birth: March 08, 1945  Date of referral:  07/18/14               Reason for consult:  Facility Placement                Permission sought to share information with:  Facility Art therapist granted to share information::  Yes, Verbal Permission Granted  Name::        Agency::     Relationship::     Contact Information:     Housing/Transportation Living arrangements for the past 2 months:  Single Family Home Source of Information:  Patient Patient Interpreter Needed:  None Criminal Activity/Legal Involvement Pertinent to Current Situation/Hospitalization:    Significant Relationships:  Adult Children Lives with:  Self Do you feel safe going back to the place where you live?    Need for family participation in patient care:  No (Coment)  Care giving concerns:  No caregivers   Facilities manager / plan:  CSW met with pt at bedside to discuss SNF option.  CSW provided an explanation of SNF process and encouraged pt to explore his thoughts and feelings related to rehab.  CSW provided active listening.  CSW prompted pt to ask any questions he had regarding discharge services.  CSW will send pt's clinical information to SNF's in [place]  Employment status:    Insurance information:  Medicare PT Recommendations:  [place] / Referral to community resources:     Patient/Family's Response to care:  Pt pleasant but feeling queezy and had to throw up while CSW was there. Pt discussed living alone and stated "I'm weak".  Pt stated that he just wants to have a good meal and relax.  Pt has a daughter living in [place] and a son in [place].  Pt stated that he "cannot hold anything down".  Pt hopeful that SNF will help build his strength back soon.    Patient/Family's Understanding of and Emotional Response to Diagnosis, Current Treatment, and  Prognosis:  Pt appears to understand his diagnoses and expressed frustration over feeling so weak and not being able to eat at this moment  Emotional Assessment Appearance:  Appears stated age Attitude/Demeanor/Rapport:  Lethargic Affect (typically observed):  Pleasant Orientation:  Oriented to Self, Oriented to Place, Oriented to  Time, Oriented to Situation Alcohol / Substance use:    Psych involvement (Current and /or in the community):  No (Comment)  Discharge Needs  Concerns to be addressed:    Readmission within the last 30 days:    Current discharge risk:    Barriers to Discharge:  No Barriers Identified   Carlean Jews, LCSW 07/18/2014, 4:41 PM

## 2014-07-18 NOTE — Plan of Care (Signed)
Problem: Phase I Progression Outcomes Goal: Voiding-avoid urinary catheter unless indicated Outcome: Progressing Condom cath     

## 2014-07-18 NOTE — Evaluation (Signed)
Physical Therapy Evaluation Patient Details Name: Phillip May MRN: [id number] DOB: 15-Oct-1945 Today's Date: 07/18/2014   History of Present Illness  69 yo male admitted with Pna, fall. Hx of COPD, cervical spinal stenosis. Pt is from home alone.   Clinical Impression  On eval, pt required Min assist for mobility-able to ambulate ~45' with RW. Mobility limited by fatigue, weakness. At this time, do not feel pt is safe to d/c home alone at this time. Recommend ST rehab at SNF, if pt will agree.     Follow Up Recommendations SNF    Equipment Recommendations  None recommended by PT    Recommendations for Other Services OT consult     Precautions / Restrictions Precautions Precautions: Fall Restrictions Weight Bearing Restrictions: No      Mobility  Bed Mobility Overal bed mobility: Needs Assistance Bed Mobility: Supine to Sit     Supine to sit: Min guard     General bed mobility comments: close guard for safety. VCS for completion of task.   Transfers Overall transfer level: Needs assistance Equipment used: Rolling walker (2 wheeled) Transfers: Sit to/from Stand Sit to Stand: Min assist         General transfer comment: Assist to rise, stabilize, control descent. VCs safety, technqiue, hand placement.   Ambulation/Gait Ambulation/Gait assistance: Min assist Ambulation Distance (Feet): 45 Feet Assistive device: Rolling walker (2 wheeled) Gait Pattern/deviations: Step-through pattern;Decreased stride length;Trunk flexed     General Gait Details: Assist to stabilize pt and maneuver with RW. 2 brief standing rest breakns taken/needed due to fatigue.   Stairs            Wheelchair Mobility    Modified Rankin (Stroke Patients Only)       Balance Overall balance assessment: Needs assistance;History of Falls         Standing balance support: Bilateral upper extremity supported;During functional activity Standing balance-Leahy Scale: Poor                                Pertinent Vitals/Pain Pain Assessment: No/denies pain    Home Living Family/patient expects to be discharged to:: Private residence Living Arrangements: Alone   Type of Home: House Home Access: Stairs to enter Entrance Stairs-Rails: Right Entrance Stairs-Number of Steps: 5 Home Layout: One level Home Equipment: Walker - 2 wheels;Cane - single point      Prior Function Level of Independence: Needs assistance   Gait / Transfers Assistance Needed: per pt, uses walker more inside and outside of home  ADL's / Homemaking Assistance Needed: neighbors help with ADLs/meals-checks in daily        Hand Dominance        Extremity/Trunk Assessment   Upper Extremity Assessment: Generalized weakness           Lower Extremity Assessment: Generalized weakness      Cervical / Trunk Assessment: Kyphotic  Communication   Communication: No difficulties  Cognition Arousal/Alertness: Awake/alert Behavior During Therapy: WFL for tasks assessed/performed Overall Cognitive Status: No family/caregiver present to determine baseline cognitive functioning       Memory:  (slow to respond/process at times)              General Comments      Exercises        Assessment/Plan    PT Assessment Patient needs continued PT services  PT Diagnosis Difficulty walking;Generalized weakness   PT Problem List Decreased strength;Decreased activity  tolerance;Decreased balance;Decreased mobility;Decreased knowledge of use of DME;Decreased cognition  PT Treatment Interventions DME instruction;Gait training;Functional mobility training;Therapeutic activities;Therapeutic exercise;Patient/family education;Balance training   PT Goals (Current goals can be found in the Care Plan section) Acute Rehab PT Goals Patient Stated Goal: to get something to eat PT Goal Formulation: With patient Time For Goal Achievement: 08/01/14 Potential to Achieve Goals: Good     Frequency Min 3X/week   Barriers to discharge        Co-evaluation               End of Session Equipment Utilized During Treatment: Gait belt Activity Tolerance: Patient limited by fatigue Patient left: in bed;with call bell/phone within reach;with bed alarm set           Time: 1610-9604 PT Time Calculation (min) (ACUTE ONLY): 23 min   Charges:   PT Evaluation $Initial PT Evaluation Tier I: 1 Procedure PT Treatments $Gait Training: 8-22 mins   PT G Codes:        Rebeca Alert, MPT Pager: [phone]

## 2014-07-19 ENCOUNTER — Inpatient Hospital Stay (HOSPITAL_COMMUNITY): Payer: Medicare Other

## 2014-07-19 LAB — LEGIONELLA ANTIGEN, URINE

## 2014-07-19 LAB — MAGNESIUM: Magnesium: 1 mg/dL — ABNORMAL LOW (ref 1.7–2.4)

## 2014-07-19 LAB — PROTIME-INR
INR: 1.12 (ref 0.00–1.49)
PROTHROMBIN TIME: 14.6 s (ref 11.6–15.2)

## 2014-07-19 LAB — POTASSIUM: Potassium: 3.7 mmol/L (ref 3.5–5.1)

## 2014-07-19 MED ORDER — ENSURE ENLIVE PO LIQD
237.0000 mL | Freq: Two times a day (BID) | ORAL | Status: DC
  Administered 2014-07-29: 237 mL via ORAL

## 2014-07-19 MED ORDER — CEFAZOLIN SODIUM-DEXTROSE 2-3 GM-% IV SOLR
2.0000 g | Freq: Once | INTRAVENOUS | Status: AC
  Administered 2014-07-20: 2 g via INTRAVENOUS
  Filled 2014-07-19 (×2): qty 50

## 2014-07-19 MED ORDER — ADULT MULTIVITAMIN W/MINERALS CH
1.0000 | ORAL_TABLET | Freq: Every day | ORAL | Status: DC
  Filled 2014-07-19 (×2): qty 1

## 2014-07-19 MED ORDER — MORPHINE SULFATE 2 MG/ML IJ SOLN
0.5000 mg | INTRAMUSCULAR | Status: DC | PRN
  Administered 2014-07-19: 0.5 mg via INTRAVENOUS
  Filled 2014-07-19: qty 1

## 2014-07-19 MED ORDER — HALOPERIDOL LACTATE 5 MG/ML IJ SOLN
1.0000 mg | Freq: Four times a day (QID) | INTRAMUSCULAR | Status: DC | PRN
  Administered 2014-07-25 – 2014-07-27 (×2): 1 mg via INTRAVENOUS
  Filled 2014-07-19 (×2): qty 1

## 2014-07-19 MED ORDER — SODIUM CHLORIDE 0.9 % IV SOLN
1.5000 g | Freq: Four times a day (QID) | INTRAVENOUS | Status: DC
  Administered 2014-07-19 – 2014-07-26 (×26): 1.5 g via INTRAVENOUS
  Filled 2014-07-19 (×36): qty 1.5

## 2014-07-19 MED ORDER — MAGNESIUM SULFATE 2 GM/50ML IV SOLN
2.0000 g | Freq: Once | INTRAVENOUS | Status: AC
  Administered 2014-07-19: 2 g via INTRAVENOUS
  Filled 2014-07-19: qty 50

## 2014-07-19 NOTE — Progress Notes (Signed)
ANTIBIOTIC CONSULT NOTE - follow up  Pharmacy Consult for Unasyn Indication: aspiration pneumonia  No Known Allergies  Patient Measurements: Height: 5\' 7"  (170.2 cm) Weight: 97 lb 10.6 oz (44.3 kg) IBW/kg (Calculated) : 66.1  Vital Signs: Temp: 98.8 F (37.1 C) (06/06 0453) Temp Source: Oral (06/06 0453) BP: 146/91 mmHg (06/06 0453) Pulse Rate: 74 (06/06 0453) Intake/Output from previous day: 06/05 0701 - 06/06 0700 In: 988.8 [P.O.:300; I.V.:288.8; IV Piggyback:400] Out: 700 [Urine:700] Intake/Output from this shift:    Labs:  Recent Labs  07/17/14 1206 07/17/14 1640 07/18/14 0805  WBC 11.1*  --  7.9  HGB 13.1  --  12.0*  PLT 251  --  227  LABCREA  --  74.51  --   CREATININE 1.34*  --  0.88   Estimated Creatinine Clearance: 50.3 mL/min (by C-G formula based on Cr of 0.88). No results for input(s): VANCOTROUGH, VANCOPEAK, VANCORANDOM, GENTTROUGH, GENTPEAK, GENTRANDOM, TOBRATROUGH, TOBRAPEAK, TOBRARND, AMIKACINPEAK, AMIKACINTROU, AMIKACIN in the last 72 hours.   Microbiology: Recent Results (from the past 720 hour(s))  Blood culture (routine x 2)     Status: None (Preliminary result)   Collection Time: 07/17/14  1:28 PM  Result Value Ref Range Status   Specimen Description BLOOD LEFT HAND  Final   Special Requests BOTTLES DRAWN AEROBIC ONLY 2CC  Final   Culture   Final           BLOOD CULTURE RECEIVED NO GROWTH TO DATE CULTURE WILL BE HELD FOR 5 DAYS BEFORE ISSUING A FINAL NEGATIVE REPORT Note: Culture results may be compromised due to an inadequate volume of blood received in culture bottles. Performed at [facility]    Report Status PENDING  Incomplete  Blood culture (routine x 2)     Status: None (Preliminary result)   Collection Time: 07/17/14  1:39 PM  Result Value Ref Range Status   Specimen Description BLOOD RIGHT HAND  Final   Special Requests BOTTLES DRAWN AEROBIC ONLY 2CC  Final   Culture   Final           BLOOD CULTURE RECEIVED NO GROWTH  TO DATE CULTURE WILL BE HELD FOR 5 DAYS BEFORE ISSUING A FINAL NEGATIVE REPORT Performed at [facility]    Report Status PENDING  Incomplete  Urine culture     Status: None   Collection Time: 07/17/14  4:40 PM  Result Value Ref Range Status   Specimen Description URINE, CLEAN CATCH  Final   Special Requests NONE  Final   Colony Count NO GROWTH Performed at [facility]   Final   Culture NO GROWTH Performed at [facility]   Final   Report Status 07/18/2014 FINAL  Final  Culture, respiratory (NON-Expectorated)     Status: None (Preliminary result)   Collection Time: 07/17/14  5:00 PM  Result Value Ref Range Status   Specimen Description SPUTUM  Final   Special Requests NONE  Final   Gram Stain PENDING  Incomplete   Culture   Final    Culture reincubated for better growth Performed at [facility]    Report Status PENDING  Incomplete    Medical History: Past Medical History  Diagnosis Date  . Inguinal hernia   . COPD (chronic obstructive pulmonary disease)   . GERD (gastroesophageal reflux disease)   . Chronic hoarseness   . Tobacco abuse   . Cervical spinal stenosis     C4-C5; C5-C6; severe stenosis and abnormal  cord signal  C6-C7 s/p decompression in 04/2004    Medications:  Scheduled:  . ampicillin-sulbactam (UNASYN) IV  1.5 g Intravenous Q8H  . azithromycin  500 mg Intravenous Q24H  . budesonide-formoterol  2 puff Inhalation BID  . enoxaparin (LOVENOX) injection  30 mg Subcutaneous Q24H  . levalbuterol  1.25 mg Nebulization TID  . magnesium sulfate 1 - 4 g bolus IVPB  2 g Intravenous Once  . nebivolol  5 mg Oral Daily  . pneumococcal 23 valent vaccine  0.5 mL Intramuscular Tomorrow-1000  . tiotropium  18 mcg Inhalation Daily   Infusions:    PRN: acetaminophen, albuterol, ALPRAZolam, guaiFENesin, hydrALAZINE, morphine injection, ondansetron (ZOFRAN) IV Anti-infectives    Start     Dose/Rate Route Frequency Ordered Stop    07/18/14 1400  azithromycin (ZITHROMAX) 500 mg in dextrose 5 % 250 mL IVPB     500 mg 250 mL/hr over 60 Minutes Intravenous Every 24 hours 07/17/14 1612     07/18/14 0200  ampicillin-sulbactam (UNASYN) 1.5 g in sodium chloride 0.9 % 50 mL IVPB     1.5 g 100 mL/hr over 30 Minutes Intravenous Every 8 hours 07/17/14 1621     07/18/14 0000  azithromycin (ZITHROMAX) 500 mg in dextrose 5 % 250 mL IVPB  Status:  Discontinued     500 mg 250 mL/hr over 60 Minutes Intravenous Every 24 hours 07/17/14 1344 07/17/14 1612   07/17/14 1500  Ampicillin-Sulbactam (UNASYN) 3 g in sodium chloride 0.9 % 100 mL IVPB     3 g 100 mL/hr over 60 Minutes Intravenous  Once 07/17/14 1432 07/17/14 1900   07/17/14 1230  cefTRIAXone (ROCEPHIN) 1 g in dextrose 5 % 50 mL IVPB  Status:  Discontinued     1 g 100 mL/hr over 30 Minutes Intravenous  Once 07/17/14 1219 07/17/14 1416   07/17/14 1230  azithromycin (ZITHROMAX) 500 mg in dextrose 5 % 250 mL IVPB     500 mg 250 mL/hr over 60 Minutes Intravenous  Once 07/17/14 1219 07/17/14 1550     Assessment: 69 y.o. male with PMH COPD presented  07/17/2014 after being found on floor w/o LOC.  Endorses progressive dysphagia x several years; thinks he aspirated on food about 1 wk ago and since developed productive cough with SOB.  Admitting for CAP/aspiration PNA.  Already received ceftriaxone/azithromycin x 1 in ED.  6/4 >> Ceftriaxone x 1 6/4 >> Azithromycin (MD) >>   6/4 >> Unasyn >>  Tmax: afebrile WBCs: sl elevated Renal: improved SCr, CrCl 50  6/4 blood: IP 6/4 urine: IP 6/4 sputum: IP 6/4 S. pneumo UAg: IP 6/4 Legionella UAg: IP   Goal of Therapy:  Eradication of infection Appropriate antibiotic dosing for indication and renal function  Plan: D3 Antibiotics  Change Unasyn to 1.5g IV q6 for CrCl > 30 ml/min  Continue to monitor renal function   Hessie Knows, PharmD, BCPS Pager [phone] 07/19/2014 8:11 AM

## 2014-07-19 NOTE — Progress Notes (Addendum)
Patient Demographics  Phillip May, is a 69 y.o. male, DOB - Oct 29, 1945, ONG:[id number]  Admit date - 07/17/2014   Admitting Physician Leroy Sea, MD  Outpatient Primary MD for the patient is Londell Moh, MD  LOS - 2   Chief Complaint  Patient presents with  . Shortness of Breath        Subjective:   [place] today has, No headache, No chest pain, No abdominal pain - No Nausea, No new weakness tingling or numbness, much improved Cough - SOB.   Assessment & Plan    1. Acute on chronic respiratory failure due to aspiration/committee acquired pneumonia. Much improved after being nothing by mouth and with empiric anti-biotics which include Unasyn and azithromycin, speech to evaluate, monitor cultures. Continue supportive care with oxygen and nebulizer treatments. Will evaluate for home oxygen need.    2. Chronic dysphagia. Now evidence of aspiration. Nothing by mouth except medications, seen by speech currently nothing by mouth awaiting modified barium swallow.    3. Sepsis as in #1 above, resolved after IV fluids with bolus and maintenance. Repeat lactate is normal.    4. History of COPD and smoking. No wheezing, supportive care as in #1 above. Counseled to quit smoking.    5. Generalized weakness and deconditioning. PT eval done and recommended SNF, social work consulted.    6. Dehydration. Clinically resolved after hydration.    7. Chronic anxiety. Home dose and as continue.   8. Hypokalemia. Replaced and stable with magnesium levels.    9. 5pm - patient despite counseling from Nurses, tried to get up from the chair and walk, tripped and fell - L hip Fracture, bedrest, pain control, Ortho Dr Carola Frost called.     Code Status: DNR  Family Communication:  None present  Disposition Plan: SNF   Consults Speech, PT   Procedures     DVT Prophylaxis  Lovenox    Lab Results  Component Value Date   PLT 227 07/18/2014    Medications  Scheduled Meds: . ampicillin-sulbactam (UNASYN) IV  1.5 g Intravenous Q6H  . azithromycin  500 mg Intravenous Q24H  . budesonide-formoterol  2 puff Inhalation BID  . enoxaparin (LOVENOX) injection  30 mg Subcutaneous Q24H  . levalbuterol  1.25 mg Nebulization TID  . magnesium sulfate 1 - 4 g bolus IVPB  2 g Intravenous Once  . nebivolol  5 mg Oral Daily  . pneumococcal 23 valent vaccine  0.5 mL Intramuscular Tomorrow-1000  . tiotropium  18 mcg Inhalation Daily   Continuous Infusions:   PRN Meds:.acetaminophen, albuterol, ALPRAZolam, guaiFENesin, hydrALAZINE, morphine injection, ondansetron (ZOFRAN) IV  Antibiotics     Anti-infectives    Start     Dose/Rate Route Frequency Ordered Stop   07/19/14 1000  ampicillin-sulbactam (UNASYN) 1.5 g in sodium chloride 0.9 % 50 mL IVPB     1.5 g 100 mL/hr over 30 Minutes Intravenous Every 6 hours 07/19/14 0812     07/18/14 1400  azithromycin (ZITHROMAX) 500 mg in dextrose 5 % 250 mL IVPB     500 mg 250 mL/hr over 60 Minutes Intravenous Every 24 hours 07/17/14 1612     07/18/14 0200  ampicillin-sulbactam (UNASYN) 1.5 g in sodium chloride 0.9 % 50  mL IVPB  Status:  Discontinued     1.5 g 100 mL/hr over 30 Minutes Intravenous Every 8 hours 07/17/14 1621 07/19/14 0812   07/18/14 0000  azithromycin (ZITHROMAX) 500 mg in dextrose 5 % 250 mL IVPB  Status:  Discontinued     500 mg 250 mL/hr over 60 Minutes Intravenous Every 24 hours 07/17/14 1344 07/17/14 1612   07/17/14 1500  Ampicillin-Sulbactam (UNASYN) 3 g in sodium chloride 0.9 % 100 mL IVPB     3 g 100 mL/hr over 60 Minutes Intravenous  Once 07/17/14 1432 07/17/14 1900   07/17/14 1230  cefTRIAXone (ROCEPHIN) 1 g in dextrose 5 % 50 mL IVPB  Status:  Discontinued     1 g 100 mL/hr over 30 Minutes Intravenous   Once 07/17/14 1219 07/17/14 1416   07/17/14 1230  azithromycin (ZITHROMAX) 500 mg in dextrose 5 % 250 mL IVPB     500 mg 250 mL/hr over 60 Minutes Intravenous  Once 07/17/14 1219 07/17/14 1550        Objective:   Filed Vitals:   07/18/14 1425 07/18/14 1934 07/18/14 2121 07/19/14 0453  BP: 132/95 135/107  146/91  Pulse: 76 78  74  Temp: 98.3 F (36.8 C) 98.4 F (36.9 C)  98.8 F (37.1 C)  TempSrc: Oral Oral  Oral  Resp: Height:      Weight:      SpO2: 100% 100% 99% 98%    Wt Readings from Last 3 Encounters:  07/17/14 44.3 kg (97 lb 10.6 oz)  05/21/11 57.153 kg (126 lb)  04/23/11 59.421 kg (131 lb)     Intake/Output Summary (Last 24 hours) at 07/19/14 1014 Last data filed at 07/19/14 0454  Gross per 24 hour  Intake 588.75 ml  Output    700 ml  Net -111.25 ml     Physical Exam  Awake Alert, Oriented X 3, No new F.N deficits, Normal affect [place].AT,PERRAL Supple Neck,No JVD, No cervical lymphadenopathy appriciated.  Symmetrical Chest wall movement, Good air movement bilaterally, few R Basilar rales RRR,No Gallops,Rubs or new Murmurs, No Parasternal Heave +ve B.Sounds, Abd Soft, No tenderness, No organomegaly appriciated, No rebound - guarding or rigidity. No Cyanosis, Clubbing or edema, No new Rash or bruise    Data Review   Micro Results Recent Results (from the past 240 hour(s))  Blood culture (routine x 2)     Status: None (Preliminary result)   Collection Time: 07/17/14  1:28 PM  Result Value Ref Range Status   Specimen Description BLOOD LEFT HAND  Final   Special Requests BOTTLES DRAWN AEROBIC ONLY 2CC  Final   Culture   Final           BLOOD CULTURE RECEIVED NO GROWTH TO DATE CULTURE WILL BE HELD FOR 5 DAYS BEFORE ISSUING A FINAL NEGATIVE REPORT Note: Culture results may be compromised due to an inadequate volume of blood received in culture bottles. Performed at [facility]    Report Status PENDING  Incomplete  Blood culture  (routine x 2)     Status: None (Preliminary result)   Collection Time: 07/17/14  1:39 PM  Result Value Ref Range Status   Specimen Description BLOOD RIGHT HAND  Final   Special Requests BOTTLES DRAWN AEROBIC ONLY 2CC  Final   Culture   Final           BLOOD CULTURE RECEIVED NO GROWTH TO DATE CULTURE WILL BE HELD FOR 5 DAYS BEFORE  ISSUING A FINAL NEGATIVE REPORT Performed at [facility]    Report Status PENDING  Incomplete  Urine culture     Status: None   Collection Time: 07/17/14  4:40 PM  Result Value Ref Range Status   Specimen Description URINE, CLEAN CATCH  Final   Special Requests NONE  Final   Colony Count NO GROWTH Performed at [facility]   Final   Culture NO GROWTH Performed at [facility]   Final   Report Status 07/18/2014 FINAL  Final  Culture, respiratory (NON-Expectorated)     Status: None (Preliminary result)   Collection Time: 07/17/14  5:00 PM  Result Value Ref Range Status   Specimen Description SPUTUM  Final   Special Requests NONE  Final   Gram Stain PENDING  Incomplete   Culture   Final    Culture reincubated for better growth Performed at [facility]    Report Status PENDING  Incomplete    Radiology Reports Dg Chest Port 1 View  07/17/2014   CLINICAL DATA:  Pt found on kitchen floor. Believed to be there for approx 4 hrs. Sts he fell but sat himself down, wasn't a "crash." Complains of sob and congestion x3 days. Was too weak to stand up from fall. Has been taking mucinex. Per neighbor, he started coughing up blood clots yesterday.  EXAM: PORTABLE CHEST - 1 VIEW  COMPARISON:  04/27/2004  FINDINGS: There is dense consolidation in knee right lower lung, projecting in the right lower lobe. This is new from the prior study.  Lungs are hyperexpanded consistent with underlying COPD. No other lung consolidation. No pulmonary edema. No pleural effusion or pneumothorax.  Cardiac silhouette is normal in size. Aorta is uncoiled. No  mediastinal or hilar masses or convincing adenopathy.  Bony thorax is demineralized but intact.  IMPRESSION: Right lower lobe consolidation consistent with pneumonia. Underlying COPD.   Electronically Signed   By: Amie Portland M.D.   On: 07/17/2014 11:58     CBC  Recent Labs Lab 07/17/14 1206 07/18/14 0805  WBC 11.1* 7.9  HGB 13.1 12.0*  HCT 37.6* 35.1*  PLT 251 227  MCV 97.9 98.9  MCH 34.1* 33.8  MCHC 34.8 34.2  RDW 12.9 13.0    Chemistries   Recent Labs Lab 07/17/14 1206 07/18/14 0805 07/19/14 0400  NA 134* 136  --   K 3.7 3.4* 3.7  CL 94* 96*  --   CO2 31 30  --   GLUCOSE 135* 131*  --   BUN 44* 24*  --   CREATININE 1.34* 0.88  --   CALCIUM 8.5* 8.1*  --   MG  --   --  1.0*  AST 26  --   --   ALT 18  --   --   ALKPHOS 76  --   --   BILITOT 0.8  --   --    ------------------------------------------------------------------------------------------------------------------ estimated creatinine clearance is 50.3 mL/min (by C-G formula based on Cr of 0.88). ------------------------------------------------------------------------------------------------------------------ No results for input(s): HGBA1C in the last 72 hours. ------------------------------------------------------------------------------------------------------------------ No results for input(s): CHOL, HDL, LDLCALC, TRIG, CHOLHDL, LDLDIRECT in the last 72 hours. ------------------------------------------------------------------------------------------------------------------ No results for input(s): TSH, T4TOTAL, T3FREE, THYROIDAB in the last 72 hours.  Invalid input(s): FREET3 ------------------------------------------------------------------------------------------------------------------ No results for input(s): VITAMINB12, FOLATE, FERRITIN, TIBC, IRON, RETICCTPCT in the last 72 hours.  Coagulation profile No results for input(s): INR, PROTIME in the last 168 hours.  No results for input(s):  DDIMER  in the last 72 hours.  Cardiac Enzymes  Recent Labs Lab 07/17/14 1206  TROPONINI <0.03   ------------------------------------------------------------------------------------------------------------------ Invalid input(s): POCBNP   Time Spent in minutes   35   Filbert Craze K M.D on 07/19/2014 at 10:14 AM  Between 7am to 7pm - Pager - [phone]  After 7pm go to [email or web address] - password [email or web address]  [facility]   Office  [phone]

## 2014-07-19 NOTE — Clinical Documentation Improvement (Signed)
*  07/17/14- EDP note- "The patient has not had any N/V, but has not had much to eat in 1-2 days despite food being brought to the house." *07/17/14- H&P- "General elderly African-American male frail lying in bed in mild SOB has a productive cough." *BMI= 15.29; pt. weighs 97 lbs.  Please clarify the specific medical condition related to the clinical findings if any:  Malnutrition- Mild Malnutrition- Moderate Malnutrition- Severe Other explanation for clinical findings Unable to clinically determine  Thank you,  Sharyn Creameronna Nesta Scaturro, BSN, RN [facility] HIM/Clinical Documentation Specialist [email or web address] [phone]

## 2014-07-19 NOTE — Progress Notes (Signed)
CSW continuing to follow.  CSW followed up with pt at bedside to discuss SNF bed offers.   CSW discussed SNF bed offers and clarified pt questions and concerns. [facility]. Pt reports that he has a son and a daughter that both live out of town. Pt provided permission for CSW to contact pt daughter, Renda Rollsajah via telephone [phone]).   CSW contacted pt daughter via telephone. CSW introduced self and explained role. CSW updated pt daughter on plans for short term rehab at SNF prior to pt returning home. CSW clarified questions surrounding insurance coverage and notified pt daughter of pt choice of [facility] for SNF placement. Pt daughter states that she plans to travel from [place] on the day that pt discharged in order for pt daughter to travel to [place] to assist with pt transition to SNF. CSW updated pt daughter that anticipation is for d/c tomorrow. CSW will notify pt daughter in AM if pt medically ready for discharge.  [facility] and left message with admissions coordinator that pt wants to accept to accept bed offer. Awaiting return phone call.   CSW to continue to follow to provide support and assist with pt disposition needs.   Loletta SpecterSuzanna Gennavieve Huq, MSW, LCSW Clinical Social Work [phone]

## 2014-07-19 NOTE — Progress Notes (Signed)
Initial Nutrition Assessment  DOCUMENTATION CODES:  Severe malnutrition in context of chronic illness, Underweight  INTERVENTION:  Ensure Enlive (each supplement provides 350kcal and 20 grams of protein) BID Provide Magic cup BID with meals, each supplement provides 290 kcal and 9 grams of protein Multivitamin with minerals daily RD to continue to monitor  NUTRITION DIAGNOSIS:  Malnutrition related to chronic illness as evidenced by severe depletion of body fat, severe depletion of muscle mass.  GOAL:  Patient will meet greater than or equal to 90% of their needs  MONITOR:  PO intake, Supplement acceptance, Labs, Weight trends, Skin, I & O's  REASON FOR ASSESSMENT:  Malnutrition Screening Tool    ASSESSMENT: 69 y.o. male, history of COPD follows with Dr. Marchelle Gearingamaswamy not on oxygen, ongoing smoking, occasional alcohol use, history of dysphagia on a soft diet per speech therapy eval in 2014, chronic hoarse voice, GERD, generalized weakness and deconditioning.   Per pt, he has not eaten anything since last Tuesday (5/31). Pt failed SLP evaluation on 6/5. MBS was performed in which dysphagia 3 diet with thin liquids was recommended. Pt has not eaten yet today. Pt is willing to drink Ensure supplements and RD to order Magic cups BID.  Nutrition-Focused physical exam completed. Findings are severe fat depletion, severe muscle depletion, and no edema.   Lab reviewed: Low Mg  Height:  Ht Readings from Last 1 Encounters:  07/17/14 5\' 7"  (1.702 m)    Weight:  Wt Readings from Last 1 Encounters:  07/17/14 97 lb 10.6 oz (44.3 kg)    Ideal Body Weight:  67.2 kg  Wt Readings from Last 10 Encounters:  07/17/14 97 lb 10.6 oz (44.3 kg)  05/21/11 126 lb (57.153 kg)  04/23/11 131 lb (59.421 kg)  03/13/11 128 lb 9.6 oz (58.333 kg)    BMI:  Body mass index is 15.29 kg/(m^2).  Estimated Nutritional Needs:  Kcal:  1300-1500  Protein:  65-75g  Fluid:  1.5L/day     Skin:   Reviewed, no issues  Diet Order:  DIET DYS 3 Room service appropriate?: Yes with Assist; Fluid consistency:: Thin  EDUCATION NEEDS:  No education needs identified at this time   Intake/Output Summary (Last 24 hours) at 07/19/14 1408 Last data filed at 07/19/14 1007  Gross per 24 hour  Intake 438.75 ml  Output    700 ml  Net -261.25 ml    Last BM:  6/5  Tilda FrancoLindsey Lilliam [place], MS, RD, LDN Pager: [phone] After Hours Pager: [phone]

## 2014-07-19 NOTE — Progress Notes (Signed)
Patient complained of pain. On call provider notified for IV pain medication. Morphine order given and administered. Pt has been confused all through the night. On call provider notified of patient's status. Medication dosage modified.  Will continue to monitor.

## 2014-07-19 NOTE — Clinical Social Work Placement (Signed)
   CLINICAL SOCIAL WORK PLACEMENT  NOTE  Date:  07/19/2014  Patient Details  Name: Phillip May MRN: [id number] Date of Birth: [birth year]  Clinical Social Work is seeking post-discharge placement for this patient at the Skilled  Nursing Facility level of care (*CSW will initial, date and re-position this form in  chart as items are completed):  Yes   Patient/family provided with [place] Clinical Social Work Department's list of facilities offering this level of care within the geographic area requested by the patient (or if unable, by the patient's family).  Yes   Patient/family informed of their freedom to choose among providers that offer the needed level of care, that participate in Medicare, Medicaid or managed care program needed by the patient, have an available bed and are willing to accept the patient.  Yes   Patient/family informed of [facility]'s ownership interest in [facility] and [facility], as well as of the fact that they are under no obligation to receive care at these facilities.  PASRR submitted to EDS on 07/18/14     PASRR number received on 07/18/14     Existing PASRR number confirmed on       FL2 transmitted to all facilities in geographic area requested by pt/family on 07/18/14     FL2 transmitted to all facilities within larger geographic area on       Patient informed that his/her managed care company has contracts with or will negotiate with certain facilities, including the following:        Yes   Patient/family informed of bed offers received.  [facility]     Physician recommends and patient chooses bed at      Patient to be transferred to   on  .  Patient to be transferred to facility by       Patient family notified on   of transfer.  Name of family member notified:        PHYSICIAN       Additional Comment:    _______________________________________________ Orson EvaKIDD, Neilani Duffee A,  LCSW 07/19/2014, 10:30 AM

## 2014-07-19 NOTE — Progress Notes (Signed)
  I spoke with Dr. Susa RaringPrashant Singh earlier today regarding the injury and plans for surgical treatment tomorrow with IM nailing of the patient's left intertrochanteric hip fracture.  I could not, however, obtain [facility] OR availability until 5pm.  I then consulted with Dr. Durene RomansMatthew Olin, who felt that he would be able to work [name] into his already scheduled cases tomorrow with greater predictability and ease for the patient and staff.  Dr. Charlann Boxerlin will evaluate and formally consult in the morning with plans for repair tomorrow pm.  Please do not hesitate to contact me with questions in the interim.  Myrene GalasMichael Bentlie Withem, MD [facility] [phone])

## 2014-07-19 NOTE — Progress Notes (Signed)
[facility] IP CLINICAL IMPRESSIONS 07/19/2014  Therapy Diagnosis Severe pharyngeal phase dysphagia;Severe cervical esophageal phase dysphagia;Moderate pharyngeal phase dysphagia;Moderate cervical esophageal phase dysphagia  Clinical Impression Known chronic dysphagia with suspected acute exacerbation.  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    Weakness in pharyngeal contraction and tongue base retraction/poor epiglottic deflection results in gross pharyngeal residuals that mix with secretions without adequate pt sensation.  With liquids, pt conducts multiple reflexive swallows (x4 - with liquids) with extended breathhold *presumed to be compensatory to help clear and prevent gross aspiration.  Poor hyolaryngeal elevation allows laryngeal penetration of liquids during the swallow.  Liquid swallows helpful to reduce solid food residuals in pharynx.    Mild amount of aspiration (silent) after the swallow noted as barium spills posterior into open larynx from pyriform sinus.   Pt did not sense residuals today (mixed with secretions) but cued swallow, liquid follow solids and cued expectoration (of secretions mixed with barium) effective to clear 80%.    Per previous MBS in 2014 *Dr Bradly ChrisStroud, radiologist stated findings consistent with esophagram from 08/2012 indicating narrowing of lower cervical esophagus and dilated cervical esophagus.  SLP today noted area near cervical hardware where barium pools slightly, clears with reflexive swallows.   Mr Phillip May again reports desire to eat with known aspiration/malnutrition risks.  Pt does admit to further weight loss of 5 pounds since 2014 and states he has "good days and bad days" re: swallowing.  SLP  advised him to monitor swallowing/nutrition closely to maximize hydration/nutrition/airway safety.  Pt readily states liquid are easier for him to consume.    To respect pt wishes, recommend dys3/thin with  precautions.  Pt will require crushed or liquid medications due to level of dysphagia.  Using live video, educated pt to findings, effective compensations.  Also recommend pt have suction set up to help with his secretion management.          No flowsheet data found.  [facility] IP DIET RECOMMENDATION 07/19/2014  SLP Diet Recommendations Dysphagia 3 (Mech soft);Thin  Liquid Administration via cup  Medication Administration Crushed with puree or liquid format  Compensations Slow rate;Small sips/bites;Follow solids with liquid;Multiple dry swallows after each bite/sip, cough and expectorate  Postural Changes and/or Swallow Maneuvers Stay upright at least 30 min after meals     [name], MS [facility] SLP [phone]

## 2014-07-20 ENCOUNTER — Inpatient Hospital Stay (HOSPITAL_COMMUNITY): Payer: Medicare Other | Admitting: Certified Registered Nurse Anesthetist

## 2014-07-20 ENCOUNTER — Inpatient Hospital Stay (HOSPITAL_COMMUNITY): Payer: Medicare Other | Admitting: Registered Nurse

## 2014-07-20 ENCOUNTER — Encounter (HOSPITAL_COMMUNITY): Payer: Self-pay | Admitting: Registered Nurse

## 2014-07-20 ENCOUNTER — Inpatient Hospital Stay (HOSPITAL_COMMUNITY): Payer: Medicare Other

## 2014-07-20 ENCOUNTER — Encounter (HOSPITAL_COMMUNITY)
Admission: EM | Disposition: A | Discharge status: Skilled Nursing Facility | Payer: Self-pay | Source: Home / Self Care | Attending: Internal Medicine

## 2014-07-20 DIAGNOSIS — S72143A Displaced intertrochanteric fracture of unspecified femur, initial encounter for closed fracture: Secondary | ICD-10-CM | POA: Diagnosis present

## 2014-07-20 HISTORY — PX: INTRAMEDULLARY (IM) NAIL INTERTROCHANTERIC: SHX5875

## 2014-07-20 LAB — BASIC METABOLIC PANEL
Anion gap: 12 (ref 5–15)
BUN: 13 mg/dL (ref 6–20)
CHLORIDE: 101 mmol/L (ref 101–111)
CO2: 24 mmol/L (ref 22–32)
CREATININE: 0.95 mg/dL (ref 0.61–1.24)
Calcium: 8.3 mg/dL — ABNORMAL LOW (ref 8.9–10.3)
GFR calc non Af Amer: >60 mL/min (ref >60)
Glucose, Bld: 109 mg/dL — ABNORMAL HIGH (ref 65–99)
POTASSIUM: 3.8 mmol/L (ref 3.5–5.1)
Sodium: 137 mmol/L (ref 135–145)

## 2014-07-20 LAB — CBC
HCT: 30.5 % — ABNORMAL LOW (ref 39.0–52.0)
Hemoglobin: 10.4 g/dL — ABNORMAL LOW (ref 13.0–17.0)
MCH: 33.8 pg (ref 26.0–34.0)
MCHC: 34.1 g/dL (ref 30.0–36.0)
MCV: 99 fL (ref 78.0–100.0)
Platelets: 275 10*3/uL (ref 150–400)
RBC: 3.08 MIL/uL — AB (ref 4.22–5.81)
RDW: 13 % (ref 11.5–15.5)
WBC: 7.9 10*3/uL (ref 4.0–10.5)

## 2014-07-20 LAB — CULTURE, RESPIRATORY
CULTURE: NORMAL
Gram Stain: NONE SEEN

## 2014-07-20 LAB — ABO/RH: ABO/RH(D): B POS

## 2014-07-20 LAB — HIV ANTIBODY (ROUTINE TESTING W REFLEX): HIV Screen 4th Generation wRfx: NONREACTIVE

## 2014-07-20 SURGERY — FIXATION, FRACTURE, INTERTROCHANTERIC, WITH INTRAMEDULLARY ROD
Anesthesia: General | Site: Hip | Laterality: Left

## 2014-07-20 SURGERY — FIXATION, FRACTURE, INTERTROCHANTERIC, WITH INTRAMEDULLARY ROD
Anesthesia: Choice | Laterality: Left

## 2014-07-20 MED ORDER — ACETAMINOPHEN 325 MG PO TABS: 650.0000 mg | ORAL_TABLET | Freq: Four times a day (QID) | ORAL | Status: DC | PRN

## 2014-07-20 MED ORDER — ONDANSETRON HCL 4 MG/2ML IJ SOLN: 4.0000 mg | Freq: Once | INTRAMUSCULAR | Status: DC | PRN

## 2014-07-20 MED ORDER — FOLIC ACID 1 MG PO TABS
1.0000 mg | ORAL_TABLET | Freq: Every day | ORAL | Status: DC
  Administered 2014-07-21 – 2014-07-30 (×7): 1 mg via ORAL
  Filled 2014-07-20 (×8): qty 1

## 2014-07-20 MED ORDER — ENOXAPARIN SODIUM 30 MG/0.3ML ~~LOC~~ SOLN
30.0000 mg | SUBCUTANEOUS | Status: DC
  Filled 2014-07-20: qty 0.3

## 2014-07-20 MED ORDER — MEPERIDINE HCL 50 MG/ML IJ SOLN: 6.2500 mg | INTRAMUSCULAR | Status: DC | PRN

## 2014-07-20 MED ORDER — LORAZEPAM 2 MG/ML IJ SOLN
2.0000 mg | INTRAMUSCULAR | Status: AC | PRN
  Administered 2014-07-21: 2 mg via INTRAVENOUS
  Administered 2014-07-22 – 2014-07-23 (×2): 1 mg via INTRAVENOUS
  Filled 2014-07-20 (×3): qty 1

## 2014-07-20 MED ORDER — EPHEDRINE SULFATE 50 MG/ML IJ SOLN
INTRAMUSCULAR | Status: DC | PRN
  Administered 2014-07-20: 10 mg via INTRAVENOUS

## 2014-07-20 MED ORDER — PROPOFOL INFUSION 10 MG/ML OPTIME
INTRAVENOUS | Status: DC | PRN
  Administered 2014-07-20: 75 ug/kg/min via INTRAVENOUS

## 2014-07-20 MED ORDER — METOCLOPRAMIDE HCL 5 MG PO TABS: 5.0000 mg | ORAL_TABLET | Freq: Three times a day (TID) | ORAL | Status: DC | PRN

## 2014-07-20 MED ORDER — PROPOFOL 10 MG/ML IV BOLUS
INTRAVENOUS | Status: AC
  Filled 2014-07-20: qty 20

## 2014-07-20 MED ORDER — ENOXAPARIN SODIUM 30 MG/0.3ML ~~LOC~~ SOLN
30.0000 mg | SUBCUTANEOUS | Status: DC
  Administered 2014-07-21 – 2014-07-25 (×3): 30 mg via SUBCUTANEOUS
  Filled 2014-07-20 (×4): qty 0.3

## 2014-07-20 MED ORDER — FENTANYL CITRATE (PF) 100 MCG/2ML IJ SOLN
INTRAMUSCULAR | Status: AC
  Filled 2014-07-20: qty 2

## 2014-07-20 MED ORDER — ONDANSETRON HCL 4 MG/2ML IJ SOLN
INTRAMUSCULAR | Status: DC | PRN
  Administered 2014-07-20: 4 mg via INTRAVENOUS

## 2014-07-20 MED ORDER — LACTATED RINGERS IV SOLN
INTRAVENOUS | Status: DC | PRN
  Administered 2014-07-20: 18:00:00 via INTRAVENOUS

## 2014-07-20 MED ORDER — ENOXAPARIN SODIUM 30 MG/0.3ML ~~LOC~~ SOLN: 30.0000 mg | SUBCUTANEOUS | Status: DC

## 2014-07-20 MED ORDER — ONDANSETRON HCL 4 MG/2ML IJ SOLN
INTRAMUSCULAR | Status: AC
  Filled 2014-07-20: qty 2

## 2014-07-20 MED ORDER — METOPROLOL TARTRATE 1 MG/ML IV SOLN
5.0000 mg | INTRAVENOUS | Status: DC | PRN
Start: 2014-07-20 — End: 2014-07-30
  Filled 2014-07-20: qty 5

## 2014-07-20 MED ORDER — CHLORHEXIDINE GLUCONATE 0.12 % MT SOLN
15.0000 mL | Freq: Two times a day (BID) | OROMUCOSAL | Status: DC
Start: 2014-07-20 — End: 2014-07-30
  Administered 2014-07-21 – 2014-07-30 (×14): 15 mL via OROMUCOSAL
  Filled 2014-07-20 (×14): qty 15

## 2014-07-20 MED ORDER — DEXTROSE 5 % IV SOLN
20.0000 mg | INTRAVENOUS | Status: DC | PRN
  Administered 2014-07-20: 15 ug/min via INTRAVENOUS

## 2014-07-20 MED ORDER — HYDROCODONE-ACETAMINOPHEN 5-325 MG PO TABS: 1.0000 | ORAL_TABLET | Freq: Four times a day (QID) | ORAL | Status: DC | PRN

## 2014-07-20 MED ORDER — ONDANSETRON HCL 4 MG/2ML IJ SOLN
4.0000 mg | Freq: Four times a day (QID) | INTRAMUSCULAR | Status: DC | PRN
  Administered 2014-07-21: 4 mg via INTRAVENOUS
  Filled 2014-07-20: qty 2

## 2014-07-20 MED ORDER — LIDOCAINE HCL (CARDIAC) 20 MG/ML IV SOLN
INTRAVENOUS | Status: AC
  Filled 2014-07-20: qty 5

## 2014-07-20 MED ORDER — METOCLOPRAMIDE HCL 5 MG/ML IJ SOLN: 5.0000 mg | Freq: Three times a day (TID) | INTRAMUSCULAR | Status: DC | PRN

## 2014-07-20 MED ORDER — ONDANSETRON HCL 4 MG PO TABS: 4.0000 mg | ORAL_TABLET | Freq: Four times a day (QID) | ORAL | Status: DC | PRN

## 2014-07-20 MED ORDER — LIDOCAINE HCL (CARDIAC) 20 MG/ML IV SOLN
INTRAVENOUS | Status: DC | PRN
  Administered 2014-07-20: 50 mg via INTRAVENOUS

## 2014-07-20 MED ORDER — CEFAZOLIN SODIUM-DEXTROSE 2-3 GM-% IV SOLR
INTRAVENOUS | Status: AC
  Filled 2014-07-20: qty 50

## 2014-07-20 MED ORDER — LORAZEPAM 1 MG PO TABS: 1.0000 mg | ORAL_TABLET | Freq: Four times a day (QID) | ORAL | Status: AC | PRN

## 2014-07-20 MED ORDER — THIAMINE HCL 100 MG/ML IJ SOLN
100.0000 mg | Freq: Every day | INTRAMUSCULAR | Status: DC
  Filled 2014-07-20: qty 1

## 2014-07-20 MED ORDER — MIDAZOLAM HCL 2 MG/2ML IJ SOLN
INTRAMUSCULAR | Status: AC
  Filled 2014-07-20: qty 2

## 2014-07-20 MED ORDER — VITAMIN B-1 100 MG PO TABS
100.0000 mg | ORAL_TABLET | Freq: Every day | ORAL | Status: DC
  Administered 2014-07-21 – 2014-07-30 (×6): 100 mg via ORAL
  Filled 2014-07-20 (×7): qty 1

## 2014-07-20 MED ORDER — ACETAMINOPHEN 650 MG RE SUPP: 650.0000 mg | Freq: Four times a day (QID) | RECTAL | Status: DC | PRN

## 2014-07-20 MED ORDER — CETYLPYRIDINIUM CHLORIDE 0.05 % MT LIQD
7.0000 mL | Freq: Two times a day (BID) | OROMUCOSAL | Status: DC
  Administered 2014-07-21 – 2014-07-29 (×15): 7 mL via OROMUCOSAL

## 2014-07-20 MED ORDER — PHENOL 1.4 % MT LIQD
1.0000 | OROMUCOSAL | Status: DC | PRN
  Filled 2014-07-20: qty 177

## 2014-07-20 MED ORDER — PHENYLEPHRINE HCL 10 MG/ML IJ SOLN
INTRAMUSCULAR | Status: AC
  Filled 2014-07-20: qty 2

## 2014-07-20 MED ORDER — MORPHINE SULFATE 2 MG/ML IJ SOLN
0.5000 mg | INTRAMUSCULAR | Status: DC | PRN
  Administered 2014-07-21 – 2014-07-27 (×13): 0.5 mg via INTRAVENOUS
  Filled 2014-07-20 (×14): qty 1

## 2014-07-20 MED ORDER — PHENYLEPHRINE 40 MCG/ML (10ML) SYRINGE FOR IV PUSH (FOR BLOOD PRESSURE SUPPORT)
PREFILLED_SYRINGE | INTRAVENOUS | Status: AC
  Filled 2014-07-20: qty 10

## 2014-07-20 MED ORDER — CEFAZOLIN SODIUM-DEXTROSE 2-3 GM-% IV SOLR
2.0000 g | Freq: Four times a day (QID) | INTRAVENOUS | Status: AC
  Administered 2014-07-21 (×2): 2 g via INTRAVENOUS
  Filled 2014-07-20 (×2): qty 50

## 2014-07-20 MED ORDER — LACTATED RINGERS IV SOLN: INTRAVENOUS | Status: DC

## 2014-07-20 MED ORDER — MIDAZOLAM HCL 5 MG/5ML IJ SOLN
INTRAMUSCULAR | Status: DC | PRN
  Administered 2014-07-20: 1 mg via INTRAVENOUS

## 2014-07-20 MED ORDER — HYDROMORPHONE HCL 1 MG/ML IJ SOLN: 0.2500 mg | INTRAMUSCULAR | Status: DC | PRN

## 2014-07-20 MED ORDER — BUPIVACAINE IN DEXTROSE 0.75-8.25 % IT SOLN
INTRATHECAL | Status: DC | PRN
  Administered 2014-07-20: 1.6 mL via INTRATHECAL

## 2014-07-20 MED ORDER — ADULT MULTIVITAMIN W/MINERALS CH
1.0000 | ORAL_TABLET | Freq: Every day | ORAL | Status: DC
  Administered 2014-07-21 – 2014-07-30 (×7): 1 via ORAL
  Filled 2014-07-20 (×8): qty 1

## 2014-07-20 MED ORDER — FENTANYL CITRATE (PF) 100 MCG/2ML IJ SOLN
INTRAMUSCULAR | Status: DC | PRN
  Administered 2014-07-20: 50 ug via INTRAVENOUS

## 2014-07-20 MED ORDER — MENTHOL 3 MG MT LOZG: 1.0000 | LOZENGE | OROMUCOSAL | Status: DC | PRN

## 2014-07-20 SURGICAL SUPPLY — 38 items
BAG SPEC THK2 15X12 ZIP CLS (MISCELLANEOUS) ×1
BAG ZIPLOCK 12X15 (MISCELLANEOUS) ×3 IMPLANT
BIT DRILL SHORT 4.2 (BIT) IMPLANT
BLADE HELICAL TFNA 95 STRL (Anchor) ×1 IMPLANT
BLADE HELICAL TFNA 95MM STRL (Anchor) ×1 IMPLANT
CHLORAPREP W/TINT 26ML (MISCELLANEOUS) ×3 IMPLANT
COVER PERINEAL POST (MISCELLANEOUS) ×3 IMPLANT
DRAPE C-ARM 42X120 X-RAY (DRAPES) ×3 IMPLANT
DRAPE C-ARMOR (DRAPES) ×3 IMPLANT
DRAPE ORTHO 2.5IN SPLIT 77X108 (DRAPES) IMPLANT
DRAPE ORTHO SPLIT 77X108 STRL (DRAPES)
DRAPE SHEET LG 3/4 BI-LAMINATE (DRAPES) ×6 IMPLANT
DRAPE STERI IOBAN 125X83 (DRAPES) ×3 IMPLANT
DRAPE U-SHAPE 47X51 STRL (DRAPES) ×6 IMPLANT
DRILL BIT SHORT 4.2 (BIT) ×3
DRSG MEPILEX BORDER 4X4 (GAUZE/BANDAGES/DRESSINGS) ×5 IMPLANT
ELECT BLADE TIP CTD 4 INCH (ELECTRODE) IMPLANT
FACESHIELD WRAPAROUND (MASK) ×3 IMPLANT
FACESHIELD WRAPAROUND OR TEAM (MASK) ×1 IMPLANT
GAUZE SPONGE 4X4 12PLY STRL (GAUZE/BANDAGES/DRESSINGS) ×3 IMPLANT
GLOVE BIO SURGEON STRL SZ8.5 (GLOVE) ×9 IMPLANT
GLOVE BIOGEL PI IND STRL 8.5 (GLOVE) ×1 IMPLANT
GLOVE BIOGEL PI INDICATOR 8.5 (GLOVE) ×2
GOWN SPEC L3 XXLG W/TWL (GOWN DISPOSABLE) ×3 IMPLANT
GUIDEWIRE 3.2X400 (WIRE) ×4 IMPLANT
KIT BASIN OR (CUSTOM PROCEDURE TRAY) ×3 IMPLANT
LIQUID BAND (GAUZE/BANDAGES/DRESSINGS) ×3 IMPLANT
MANIFOLD NEPTUNE II (INSTRUMENTS) ×3 IMPLANT
NAIL TROCH FIX LNG 11X400LT (Nail) ×2 IMPLANT
PACK GENERAL/GYN (CUSTOM PROCEDURE TRAY) ×3 IMPLANT
PEN SKIN MARKING BROAD (MISCELLANEOUS) ×3 IMPLANT
REAMER ROD DEEP FLUTE 2.5X950 (INSTRUMENTS) ×2 IMPLANT
SCREW LOCKING 5.0MMX44MM (Screw) ×2 IMPLANT
SUT MNCRL AB 3-0 PS2 18 (SUTURE) ×3 IMPLANT
SUT MON AB 2-0 CT1 27 (SUTURE) ×3 IMPLANT
SUT VIC AB 1 CT1 36 (SUTURE) ×3 IMPLANT
TRAY FOLEY METER SIL LF 16FR (CATHETERS) ×2 IMPLANT
YANKAUER SUCT BULB TIP NO VENT (SUCTIONS) ×3 IMPLANT

## 2014-07-20 NOTE — Progress Notes (Signed)
PT Cancellation Note  Patient Details Name: Phillip May MRN: [id number] DOB: July 15, 1945   Cancelled Treatment:     L hip surgery today.     Phillip May, Phillip May Ann 07/20/2014, 8:46 AM

## 2014-07-20 NOTE — Anesthesia Procedure Notes (Addendum)
Spinal Patient location during procedure: OR Start time: 07/20/2014 6:20 PM End time: 07/20/2014 6:25 PM Staffing Anesthesiologist: Arta BruceSSEY, KEVIN Performed by: anesthesiologist  Preanesthetic Checklist Completed: patient identified, site marked, surgical consent, pre-op evaluation, timeout performed, IV checked, risks and benefits discussed and monitors and equipment checked Spinal Block Patient position: sitting Prep: Betadine Patient monitoring: heart rate, cardiac monitor, continuous pulse ox and blood pressure Approach: right paramedian Location: L3-4 Injection technique: single-shot Needle Needle type: Pencan  Needle gauge: 24 G Needle insertion depth: 6 cm Assessment Sensory level: T8  Procedure Name: MAC Date/Time: 07/20/2014 6:23 PM Performed by: Jarvis NewcomerARMISTEAD, Richards Pherigo A Pre-anesthesia Checklist: Timeout performed, Emergency Drugs available, Suction available, Patient being monitored and Patient identified Patient Re-evaluated:Patient Re-evaluated prior to inductionOxygen Delivery Method: Simple face mask Dental Injury: Teeth and Oropharynx as per pre-operative assessment

## 2014-07-20 NOTE — Progress Notes (Signed)
**Note Phillip-Identified via Obfuscation** Patient Demographics  Phillip May, is a 69 y.o. male, DOB - 07/26/1945, GNF:[id number]  Admit date - 07/17/2014   Admitting Physician Leroy Sea, MD  Outpatient Primary MD for the patient is Londell Moh, MD  LOS - 3   Chief Complaint  Patient presents with  . Shortness of Breath      Summary   Phillip May is a 69 y.o. male, history of COPD follows with Dr. Marchelle Gearing not on oxygen, ongoing smoking, occasional alcohol use, history of dysphagia on a soft diet per speech therapy eval in 2014, chronic hoarse voice, GERD, generalized weakness and deconditioning. Who lives at home by himself with help from neighbors and getting groceries and activities of daily living.  Patient was brought into the ER after a neighbor found him on the floor of his kitchen, and apparently patient was feeling weak and sat himself on the floor, he did not hit the floor or hurt himself. He states that he has been gradually developing dysphagia over the course of few years, likely aspirated and choked on food about a week ago, since then has had a productive cough with some shortness of breath, no fevers, has been not eating or drinking well since that time and getting gradually weak. Finally slumped to the floor as above and was brought to the ER.  In the ER workups at assistive of acute on chronic respiratory failure due to aspiration/community-acquired pneumonia. He also had signs of dehydration with elevated lactate/early sepsis. I was called to admit the patient.  2 days into the admission Patient got confused, fell, L Hip fracture, discussed his case with his daughter and son, now being told that he was actually drinking heavily and not once a week as he told during admission. They want him to be full code.  They have been told clearly that he will be a high-risk candidate for adverse cardiopulmonary outcome during the perioperative period. They understand this and accept the risks and benefits.   Subjective:   Phillip May today has, No headache, No chest pain, No abdominal pain - No Nausea, No new weakness tingling or numbness, much improved Cough - SOB. L hip pain, is confused today.  Assessment & Plan    1. L hip fracture due to Dilirium - Alcohol withdrawal - Dr. Carola Frost informed, to be seen and operated by Dr. Charlann Boxer, discussed his care as above with his son and daughter.  Cardio-Pulm Risk stratification for surgery and recommendations to minimize the same:-  A.Cardio-Pulmonary Risk -  this patient is a high risk  for adverse Cardio-Pulmonary  Outcome  from surgery, the risks and benefits were discussed and acceptable to son, daughter and patient.  Recommendations for optimizing Cardio-Pulmonary  Risk risk factors  1. Keep SBP<140, HR<85, use Lopressor  IV q4hrs PRN, or B.Blocker drip PRN. 2. Moniotr I&Os. 3. Minimal sedation and Narcotics. 4. Good pulmunary toilet. 5. PRN Nebs and as needed oxygen to keep Pox>90% 6. Hb>8, transfuse as needed- Lasix  IV after each unit PRBC Transfused.   B.Bleeding Risk - no previous surgical complications, no easy bruising,  Antiplate meds  none.   Currently on Lovenox prophylaxis last dose 6 PM on 07/19/2014. Next dose due 11 PM 07/19/2014  Lab Results  Component Value Date   PLT 275 07/20/2014                  Lab Results  Component Value Date   INR 1.12 07/19/2014      Will request Surgeon to please Order DVT prophylaxis of his/her choice, along with activity, weight bearing precautions and diet if appropriate.      2. Acute on chronic respiratory failure due to aspiration/committee acquired pneumonia. Much improved after being nothing by mouth and with empiric anti-biotics which include Unasyn and azithromycin, speech to  evaluate, monitor cultures. Continue supportive care with oxygen and nebulizer treatments. Will evaluate for home oxygen need.    3. Chronic dysphagia H/O esophageal diverticulum. Now evidence of aspiration. Nothing by mouth except medications, seen by speech currently nothing by mouth awaiting modified barium swallow.    4. Sepsis as in #1 above, resolved after IV fluids with bolus and maintenance. Repeat lactate is normal.    5. History of COPD and smoking. No wheezing, supportive care as in #1 above. Counseled to quit smoking.    6. Generalized weakness and deconditioning. PT eval done and recommended SNF, social work consulted.    7. Dehydration. Clinically resolved after hydration.   8. Chronic anxiety. Home dose and as continue.   9. Hypokalemia. Replaced and stable with magnesium levels.   10. Alcohol withdrawal. Place on CIWA protocol. Counseled.    Code Status: DNR  Family Communication: None present  Disposition Plan: SNF   Consults Speech, PT   Procedures     DVT Prophylaxis  Lovenox    Lab Results  Component Value Date   PLT 275 07/20/2014    Medications  Scheduled Meds: . ampicillin-sulbactam (UNASYN) IV  1.5 g Intravenous Q6H  . azithromycin  500 mg Intravenous Q24H  . budesonide-formoterol  2 puff Inhalation BID  .  ceFAZolin (ANCEF) IV  2 g Intravenous Once  . enoxaparin (LOVENOX) injection  30 mg Subcutaneous Q24H  . feeding supplement (ENSURE ENLIVE)  237 mL Oral BID BM  . levalbuterol  1.25 mg Nebulization TID  . multivitamin with minerals  1 tablet Oral Daily  . nebivolol  5 mg Oral Daily  . pneumococcal 23 valent vaccine  0.5 mL Intramuscular Tomorrow-1000  . tiotropium  18 mcg Inhalation Daily   Continuous Infusions:   PRN Meds:.acetaminophen, albuterol, ALPRAZolam, guaiFENesin, haloperidol lactate, hydrALAZINE, metoprolol, morphine injection, ondansetron (ZOFRAN) IV  Antibiotics     Anti-infectives    Start      Dose/Rate Route Frequency Ordered Stop   07/20/14 1000  ceFAZolin (ANCEF) IVPB 2 g/50 mL premix     2 g 100 mL/hr over 30 Minutes Intravenous  Once 07/19/14 2133     07/19/14 1000  ampicillin-sulbactam (UNASYN) 1.5 g in sodium chloride 0.9 % 50 mL IVPB     1.5 g 100 mL/hr over 30 Minutes Intravenous Every 6 hours 07/19/14 0812     07/18/14 1400  azithromycin (ZITHROMAX) 500 mg in dextrose 5 % 250 mL IVPB     500 mg 250 mL/hr over 60 Minutes Intravenous Every 24 hours 07/17/14 1612     07/18/14 0200  ampicillin-sulbactam (UNASYN) 1.5 g in sodium chloride 0.9 % 50 mL IVPB  Status:  Discontinued     1.5 g 100 mL/hr over 30 Minutes Intravenous Every 8 hours 07/17/14 1621 07/19/14 0812   07/18/14 0000  azithromycin (ZITHROMAX) 500 mg in dextrose 5 % 250 mL IVPB  Status:  Discontinued     500 mg 250 mL/hr over 60 Minutes Intravenous Every 24 hours 07/17/14 1344 07/17/14 1612   07/17/14 1500  Ampicillin-Sulbactam (UNASYN) 3 g in sodium chloride 0.9 % 100 mL IVPB     3 g 100 mL/hr over 60 Minutes Intravenous  Once 07/17/14 1432 07/17/14 1900   07/17/14 1230  cefTRIAXone (ROCEPHIN) 1 g in dextrose 5 % 50 mL IVPB  Status:  Discontinued     1 g 100 mL/hr over 30 Minutes Intravenous  Once 07/17/14 1219 07/17/14 1416   07/17/14 1230  azithromycin (ZITHROMAX) 500 mg in dextrose 5 % 250 mL IVPB     500 mg 250 mL/hr over 60 Minutes Intravenous  Once 07/17/14 1219 07/17/14 1550        Objective:   Filed Vitals:   07/19/14 1902 07/19/14 2145 07/20/14 0553 07/20/14 0859  BP: 155/116 179/99 158/83   Pulse: 84 92 94   Temp:  98 F (36.7 C) 97.2 F (36.2 C)   TempSrc:  Oral Oral   Resp:      Height:      Weight:      SpO2: 99% 95% 97% 95%    Wt Readings from Last 3 Encounters:  07/17/14 44.3 kg (97 lb 10.6 oz)  05/21/11 57.153 kg (126 lb)  04/23/11 59.421 kg (131 lb)     Intake/Output Summary (Last 24 hours) at 07/20/14 1121 Last data filed at 07/20/14 1610  Gross per 24 hour  Intake     200 ml  Output      0 ml  Net    200 ml     Physical Exam  Awake,Oriented X1, No new F.N deficits, Normal affect [place].AT,PERRAL Supple Neck,No JVD, No cervical lymphadenopathy appriciated.  Symmetrical Chest wall movement, Good air movement bilaterally, few R Basilar rales RRR,No Gallops,Rubs or new Murmurs, No Parasternal Heave +ve B.Sounds, Abd Soft, No tenderness, No organomegaly appriciated, No rebound - guarding or rigidity. No Cyanosis, Clubbing or edema, No new Rash or bruise    Data Review   Micro Results Recent Results (from the past 240 hour(s))  Blood culture (routine x 2)     Status: None (Preliminary result)   Collection Time: 07/17/14  1:28 PM  Result Value Ref Range Status   Specimen Description BLOOD LEFT HAND  Final   Special Requests BOTTLES DRAWN AEROBIC ONLY 2CC  Final   Culture   Final           BLOOD CULTURE RECEIVED NO GROWTH TO DATE CULTURE WILL BE HELD FOR 5 DAYS BEFORE ISSUING A FINAL NEGATIVE REPORT Note: Culture results may be compromised due to an inadequate volume of blood received in culture bottles. Performed at [facility]    Report Status PENDING  Incomplete  Blood culture (routine x 2)     Status: None (Preliminary result)   Collection Time: 07/17/14  1:39 PM  Result Value Ref Range Status   Specimen Description BLOOD RIGHT HAND  Final   Special Requests BOTTLES DRAWN AEROBIC ONLY 2CC  Final   Culture   Final           BLOOD CULTURE RECEIVED NO GROWTH TO DATE CULTURE WILL BE HELD FOR 5 DAYS BEFORE ISSUING A FINAL NEGATIVE REPORT Performed at [facility]    Report Status PENDING  Incomplete  Urine culture     Status: None   Collection Time: 07/17/14  4:40 PM  Result Value Ref Range Status  Specimen Description URINE, CLEAN CATCH  Final   Special Requests NONE  Final   Colony Count NO GROWTH Performed at [facility]   Final   Culture NO GROWTH Performed at [facility]   Final   Report Status  07/18/2014 FINAL  Final  Culture, respiratory (NON-Expectorated)     Status: None   Collection Time: 07/17/14  5:00 PM  Result Value Ref Range Status   Specimen Description SPUTUM  Final   Special Requests NONE  Final   Gram Stain   Final    NO WBC SEEN RARE SQUAMOUS EPITHELIAL CELLS PRESENT FEW GRAM POSITIVE COCCI IN PAIRS RARE GRAM POSITIVE RODS Performed at [facility]    Culture   Final    NORMAL OROPHARYNGEAL FLORA Performed at [facility]    Report Status 07/20/2014 FINAL  Final    Radiology Reports Ct Head Wo Contrast  07/19/2014   CLINICAL DATA:  Headache, hallucinating objects  EXAM: CT HEAD WITHOUT CONTRAST  TECHNIQUE: Contiguous axial images were obtained from the base of the skull through the vertex without intravenous contrast.  COMPARISON:  None  FINDINGS: There is no evidence of mass effect, midline shift, or extra-axial fluid collections. There is no evidence of a space-occupying lesion or intracranial hemorrhage. There is no evidence of a cortical-based area of acute infarction. 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 There is generalized cerebral atrophy. There is periventricular white matter low attenuation likely secondary to microangiopathy.  The ventricles and sulci are appropriate for the patient's age. The basal cisterns are patent.  Visualized portions of the orbits are unremarkable. There is near complete opacification of the left maxillary sinus. There is an air-fluid level in the right sphenoid sinus. There is mild left ethmoid sinus mucosal thickening. Cerebrovascular atherosclerotic calcifications are noted.  The osseous structures are unremarkable.  IMPRESSION: 1. No acute intracranial pathology. 2. Chronic microvascular disease and cerebral atrophy. 3. Right sphenoid and left maxillary sinus disease. Mild left ethmoid sinus disease.   Electronically Signed   By: Elige Ko   On: 07/19/2014 18:01   Dg Chest Port 1 View  07/17/2014    CLINICAL DATA:  Pt found on kitchen floor. Believed to be there for approx 4 hrs. Sts he fell but sat himself down, wasn't a "crash." Complains of sob and congestion x3 days. Was too weak to stand up from fall. Has been taking mucinex. Per neighbor, he started coughing up blood clots yesterday.  EXAM: PORTABLE CHEST - 1 VIEW  COMPARISON:  04/27/2004  FINDINGS: There is dense consolidation in knee right lower lung, projecting in the right lower lobe. This is new from the prior study.  Lungs are hyperexpanded consistent with underlying COPD. No other lung consolidation. No pulmonary edema. No pleural effusion or pneumothorax.  Cardiac silhouette is normal in size. Aorta is uncoiled. No mediastinal or hilar masses or convincing adenopathy.  Bony thorax is demineralized but intact.  IMPRESSION: Right lower lobe consolidation consistent with pneumonia. Underlying COPD.   Electronically Signed   By: Amie Portland M.D.   On: 07/17/2014 11:58   Dg Knee Left Port  07/19/2014   CLINICAL DATA:  Status post fall.  EXAM: PORTABLE LEFT KNEE - 1-2 VIEW  COMPARISON:  None.  FINDINGS: There is no evidence of fracture, dislocation, or joint effusion. There is generalized osteopenia. There is no evidence of arthropathy or other focal bone abnormality. Soft tissues are unremarkable. There is peripheral vascular atherosclerotic disease.  IMPRESSION: No acute osseous injury of the left knee.   Electronically Signed   By: Elige Ko   On: 07/19/2014 20:25   Dg Swallowing Func-speech Pathology  07/19/2014    Objective Swallowing Evaluation:    Patient Details  Name: JAESHAUN RIVA MRN: [id number] Date of Birth: February 06, 1946  Today's Date: 07/19/2014 Time: SLP Start Time (ACUTE ONLY): 1120-SLP Stop Time (ACUTE ONLY): 1155 SLP Time Calculation (min) (ACUTE ONLY): 35 min  Past Medical History:  Past Medical History  Diagnosis Date  . Inguinal hernia   . COPD (chronic obstructive pulmonary disease)   . GERD (gastroesophageal reflux disease)    . Chronic hoarseness   . Tobacco abuse   . Cervical spinal stenosis     C4-C5; C5-C6; severe stenosis and abnormal  cord signal C6-C7 s/p  decompression in 04/2004   Past Surgical History:  Past Surgical History  Procedure Laterality Date  . Inguinal hernia repair    . Posterior laminectomy / decompression cervical spine  2006   HPI:  Other Pertinent Information: Corbitt Cloke is a 69 y.o. male, history of  COPD (not on oxygen), ongoing smoking, occasional alcohol use, history of  dysphagia on a soft diet per MBS in September 2014, chronic hoarse voice,  GERD, generalized weakness and deconditioning.  PSH + for cervical  decompression surgery in 2006.  Pt reported chronic problems with  hoarseness and dysphagia s/p cervical surgery.  He also experienced 30  pound weight loss within 3 years-pt reported in 2014.  He lives at home  alone and receives help from neighbors for getting groceries and  activities of daily living.  The MBS in September 2014 did show trace  aspiration of thins and penetration of nectar liquids.  CXR showed right  lower lobe pna.    No Data Recorded  Assessment / Plan / Recommendation CHL IP CLINICAL IMPRESSIONS 07/19/2014  Therapy Diagnosis Severe pharyngeal phase dysphagia;Severe cervical  esophageal phase dysphagia;Moderate pharyngeal phase dysphagia;Moderate  cervical esophageal phase dysphagia  Clinical Impression Known chronic dysphagia with suspected acute  exacerbation.  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    Weakness in pharyngeal contraction and tongue base retraction/poor  epiglottic deflection results in gross pharyngeal residuals that mix with  secretions without adequate pt sensation.  With liquids, pt conducts  multiple reflexive swallows (x4 - with liquids) with extended breathhold  *presumed to be compensatory to help clear and prevent gross aspiration.   Poor hyolaryngeal elevation allows laryngeal  penetration of liquids during  the swallow.  Liquid swallows helpful to reduce solid food residuals in  pharynx.    Mild amount of aspiration (silent) after the swallow noted as barium  spills posterior into open larynx from pyriform sinus.   Pt did not sense  residuals today (mixed with secretions) but cued swallow, liquid follow  solids and cued expectoration (of secretions mixed with barium) effective  to clear 80%.    Per previous MBS in 2014 *Dr Bradly Chris, radiologist stated findings  consistent with esophagram from 08/2012 indicating narrowing of lower  cervical esophagus and dilated cervical esophagus.  SLP today noted area  near cervical hardware where barium pools slightly, clears with reflexive  swallows.   Mr Aversa again reports desire to eat with known aspiration/malnutrition  risks.  Pt does admit to further weight loss of 5 pounds since 2014 and  states he has "good days and bad days" re: swallowing.  SLP  advised him  to monitor swallowing/nutrition closely to maximize  hydration/nutrition/airway safety.  Pt readily states liquid are easier  for him to consume.    To respect pt wishes, recommend dys3/thin with precautions.  Pt will  require crushed or liquid medications due to level of dysphagia.  Using  live video, educated pt to findings, effective compensations.  Also  recommend pt have suction set up to help with his secretion management.           No flowsheet data found.   CHL IP DIET RECOMMENDATION 07/19/2014  SLP Diet Recommendations Dysphagia 3 (Mech soft);Thin  Liquid Administration via cup  Medication Administration Crushed with puree  Compensations Slow rate;Small sips/bites;Follow solids with  liquid;Multiple dry swallows after each bite/sip, cough and expectorate  Postural Changes and/or Swallow Maneuvers (None)     CHL IP OTHER RECOMMENDATIONS 07/19/2014  Recommended Consults (None)  Oral Care Recommendations Oral care QID  Other Recommendations (None)     CHL IP FOLLOW UP RECOMMENDATIONS  10/28/2012  Follow up Recommendations None     CHL IP FREQUENCY AND DURATION 07/19/2014  Speech Therapy Frequency (ACUTE ONLY) min 2x/week  Treatment Duration 1 week         CHL IP REASON FOR REFERRAL 07/19/2014  Reason for Referral Objectively evaluate swallowing function     CHL IP ORAL PHASE 07/19/2014                    Oral Phase WFL                     CHL IP PHARYNGEAL PHASE 07/19/2014  Pharyngeal Phase Impaired  Pharyngeal Comment multiple swallows across consistencies, following solid  with liquids helpful to decrease vallecular residuals, cough and  expectoration helpful, limited postural changes available due to cervical  decompression sx in 06      Donavan Burnet, MS [facility] SLP [phone]    Dg Hip Unilat With Pelvis 2-3 Views Left  07/19/2014   CLINICAL DATA:  Fall today with left hip pain, initial encounter  EXAM: LEFT HIP (WITH PELVIS) 2-3 VIEWS  COMPARISON:  None.  FINDINGS: There is an a minimally displaced intratrochanteric fracture of the proximal left femur. No dislocation is noted. Contrast material is seen from prior speech pathology examination. The pelvic ring is otherwise intact. No other focal abnormality is seen.  IMPRESSION: Mildly displaced left intratrochanteric femoral fracture.   Electronically Signed   By: Alcide Clever M.D.   On: 07/19/2014 17:34     CBC  Recent Labs Lab 07/17/14 1206 07/18/14 0805 07/20/14 0525  WBC 11.1* 7.9 7.9  HGB 13.1 12.0* 10.4*  HCT 37.6* 35.1* 30.5*  PLT 251 227 275  MCV 97.9 98.9 99.0  MCH 34.1* 33.8 33.8  MCHC 34.8 34.2 34.1  RDW 12.9 13.0 13.0    Chemistries   Recent Labs Lab 07/17/14 1206 07/18/14 0805 07/19/14 0400 07/20/14 0525  NA 134* 136  --  137  K 3.7 3.4* 3.7 3.8  CL 94* 96*  --  101  CO2 31 30  --  24  GLUCOSE 135* 131*  --  109*  BUN 44* 24*  --  13  CREATININE 1.34* 0.88  --  0.95  CALCIUM 8.5* 8.1*  --  8.3*  MG  --   --  1.0*  --   AST 26  --   --   --   ALT 18  --   --   --   ALKPHOS  76  --   --   --   BILITOT  0.8  --   --   --    ------------------------------------------------------------------------------------------------------------------ estimated creatinine clearance is 46.6 mL/min (by C-G formula based on Cr of 0.95). ------------------------------------------------------------------------------------------------------------------ No results for input(s): HGBA1C in the last 72 hours. ------------------------------------------------------------------------------------------------------------------ No results for input(s): CHOL, HDL, LDLCALC, TRIG, CHOLHDL, LDLDIRECT in the last 72 hours. ------------------------------------------------------------------------------------------------------------------ No results for input(s): TSH, T4TOTAL, T3FREE, THYROIDAB in the last 72 hours.  Invalid input(s): FREET3 ------------------------------------------------------------------------------------------------------------------ No results for input(s): VITAMINB12, FOLATE, FERRITIN, TIBC, IRON, RETICCTPCT in the last 72 hours.  Coagulation profile  Recent Labs Lab 07/19/14 1906  INR 1.12    No results for input(s): DDIMER in the last 72 hours.  Cardiac Enzymes  Recent Labs Lab 07/17/14 1206  TROPONINI <0.03   ------------------------------------------------------------------------------------------------------------------ Invalid input(s): POCBNP   Time Spent in minutes   35   Ellise Kovack K M.D on 07/20/2014 at 11:21 AM  Between 7am to 7pm - Pager - [phone]  After 7pm go to [email or web address] - password [facility]  [facility]   Office  [phone]

## 2014-07-20 NOTE — Anesthesia Preprocedure Evaluation (Addendum)
Anesthesia Evaluation  Patient identified by MRN, date of birth, ID band Patient awake    Reviewed: Allergy & Precautions, NPO status , Patient's Chart, lab work & pertinent test results  Airway Mallampati: I  TM Distance: >3 FB Neck ROM: Full    Dental   Pulmonary COPDCurrent Smoker,  breath sounds clear to auscultation  Pulmonary exam normal       Cardiovascular Normal cardiovascular exam    Neuro/Psych    GI/Hepatic GERD-  Medicated and Controlled,  Endo/Other    Renal/GU      Musculoskeletal   Abdominal   Peds  Hematology   Anesthesia Other Findings   Reproductive/Obstetrics                            Anesthesia Physical Anesthesia Plan  ASA: III  Anesthesia Plan: Spinal   Post-op Pain Management:    Induction: Intravenous  Airway Management Planned: Natural Airway and Simple Face Mask  Additional Equipment:   Intra-op Plan:   Post-operative Plan:   Informed Consent: I have reviewed the patients History and Physical, chart, labs and discussed the procedure including the risks, benefits and alternatives for the proposed anesthesia with the patient or authorized representative who has indicated his/her understanding and acceptance.     Plan Discussed with: CRNA and Surgeon  Anesthesia Plan Comments:        Anesthesia Quick Evaluation

## 2014-07-20 NOTE — Brief Op Note (Signed)
07/17/2014 - 07/20/2014  7:49 PM  PATIENT:  Phillip May  69 y.o. male  PRE-OPERATIVE DIAGNOSIS:  left interochantric fracture  POST-OPERATIVE DIAGNOSIS:  LEFT INTERTROCHANTERIC FRACTURE  PROCEDURE:  Procedure(s): INTRAMEDULLARY (IM) NAIL INTERTROCHANTRIC LEFT HIP (Left)  SURGEON:  Surgeon(s) and Role:    * Samson FredericBrian Braelin Costlow, MD - Primary  PHYSICIAN ASSISTANT:   ASSISTANTS: Alphonsa OverallBrad Dixon, PA-c   ANESTHESIA:   spinal  EBL:  Total I/O In: -  Out: 175 [Urine:75; Blood:100]  BLOOD ADMINISTERED:none  DRAINS: none   LOCAL MEDICATIONS USED:  NONE  SPECIMEN:  No Specimen  DISPOSITION OF SPECIMEN:  N/A  COUNTS:  YES  TOURNIQUET:  * No tourniquets in log *  DICTATION: .Other Dictation: Dictation Number [id number]  PLAN OF CARE: Admit to inpatient   PATIENT DISPOSITION:  PACU - hemodynamically stable.   Delay start of Pharmacological VTE agent (>24hrs) due to surgical blood loss or risk of bleeding: not applicable

## 2014-07-20 NOTE — H&P (View-Only) (Signed)
Reason for Consult:  Left intertrochanteric hip fracture Referring Physician:  Singh, P  Phillip May is an 69 y.o. male.  HPI: 69yo yo male with multiple medical co-morbidities hospitalized recently for community acquired pneumonia.  Was to discharged soon.  Unfortunately it appears that he tried to get from his chair today when he stumbled and landed on his left hip.  Due to pain level and inability to bear weight, X-rays were ordered which revealed hip fracture.  Initially Dr. Handy was consulted as he was on call.  Based on timing he asked if I would be able to address fracture earlier.  Patient reports a lot of pain in left proximal hip region.  No other injuries reported  History of cervical spine surgery in past with resulted bilateral upper extremity flexion contractures - reviewed with him as important from ambulation standpoint after operation for use of a walker  Past Medical History  Diagnosis Date  . Inguinal hernia   . COPD (chronic obstructive pulmonary disease)   . GERD (gastroesophageal reflux disease)   . Chronic hoarseness   . Tobacco abuse   . Cervical spinal stenosis     C4-C5; C5-C6; severe stenosis and abnormal  cord signal C6-C7 s/p decompression in 04/2004    Past Surgical History  Procedure Laterality Date  . Inguinal hernia repair    . Posterior laminectomy / decompression cervical spine  2006    Family History  Problem Relation Age of Onset  . Emphysema Father   . Stomach cancer Sister     Social History:  reports that he has been smoking Cigarettes.  He has a 6 pack-year smoking history. He has never used smokeless tobacco. He reports that he drinks about 7.0 oz of alcohol per week. He reports that he does not use illicit drugs.  Allergies: No Known Allergies  Medications:  I have reviewed the patient's current medications. Scheduled: . ampicillin-sulbactam (UNASYN) IV  1.5 g Intravenous Q6H  . azithromycin  500 mg Intravenous Q24H  .  budesonide-formoterol  2 puff Inhalation BID  .  ceFAZolin (ANCEF) IV  2 g Intravenous Once  . enoxaparin (LOVENOX) injection  30 mg Subcutaneous Q24H  . feeding supplement (ENSURE ENLIVE)  237 mL Oral BID BM  . levalbuterol  1.25 mg Nebulization TID  . multivitamin with minerals  1 tablet Oral Daily  . nebivolol  5 mg Oral Daily  . pneumococcal 23 valent vaccine  0.5 mL Intramuscular Tomorrow-1000  . tiotropium  18 mcg Inhalation Daily    Results for orders placed or performed during the hospital encounter of 07/17/14 (from the past 24 hour(s))  Protime-INR     Status: None   Collection Time: 07/19/14  7:06 PM  Result Value Ref Range   Prothrombin Time 14.6 11.6 - 15.2 seconds   INR 1.12 0.00 - 1.49  CBC     Status: Abnormal   Collection Time: 07/20/14  5:25 AM  Result Value Ref Range   WBC 7.9 4.0 - 10.5 K/uL   RBC 3.08 (L) 4.22 - 5.81 MIL/uL   Hemoglobin 10.4 (L) 13.0 - 17.0 g/dL   HCT 30.5 (L) 39.0 - 52.0 %   MCV 99.0 78.0 - 100.0 fL   MCH 33.8 26.0 - 34.0 pg   MCHC 34.1 30.0 - 36.0 g/dL   RDW 13.0 11.5 - 15.5 %   Platelets 275 150 - 400 K/uL  Basic metabolic panel     Status: Abnormal   Collection Time: 07/20/14    5:25 AM  Result Value Ref Range   Sodium 137 135 - 145 mmol/L   Potassium 3.8 3.5 - 5.1 mmol/L   Chloride 101 101 - 111 mmol/L   CO2 24 22 - 32 mmol/L   Glucose, Bld 109 (H) 65 - 99 mg/dL   BUN 13 6 - 20 mg/dL   Creatinine, Ser 0.95 0.61 - 1.24 mg/dL   Calcium 8.3 (L) 8.9 - 10.3 mg/dL   GFR calc non Af Amer >60 >60 mL/min   GFR calc Af Amer >60 >60 mL/min   Anion gap 12 5 - 15    X-ray: CLINICAL DATA: Fall today with left hip pain, initial encounter  EXAM: LEFT HIP (WITH PELVIS) 2-3 VIEWS  COMPARISON: None.  FINDINGS: There is an a minimally displaced intratrochanteric fracture of the proximal left femur. No dislocation is noted. Contrast material is seen from prior speech pathology examination. The pelvic ring is otherwise intact. No other  focal abnormality is seen.  IMPRESSION: Mildly displaced left intratrochanteric femoral fracture.   Electronically Signed  By: Mark Lukens M.D  ROS: In addition to the HPI above,  No Fever-chills, No Headache, No changes with Vision or hearing, +ve problems swallowing food or Liquids, No Chest pain,++ productive Cough & Shortness of Breath, No Abdominal pain, No Nausea or Vommitting, Bowel movements are regular, No Blood in stool or Urine, No dysuria, No new skin rashes or bruises, No new joints pains-aches,  No new weakness, tingling, numbness in any extremity, No recent weight gain or loss, No polyuria, polydypsia or polyphagia, No significant Mental Stressors.  Blood pressure 158/83, pulse 94, temperature 97.2 F (36.2 C), temperature source Oral, resp. rate 18, height 5' 7" (1.702 m), weight 44.3 kg (97 lb 10.6 oz), SpO2 95 %.  Physical Exam  Awake alert, pleasant disposition Hurting trying to rest A lot of saliva around mouth  Bilateral hands and wrist contracted (since c-spine surgery 2006) Left hip shortened and externally rotated with pain with any movement  General medical exam reviewed from hospital notes since admission Obvious productive cough  Assessment/Plan: Left hip intertrochanteric proximal femur fracture  Reviewed with patient the issues at hand and the subsequent need for surgical intervention NPO Consent ordered Plan to go to OR tonight for ORIF left hip   "1. Acute on chronic respiratory failure due to aspiration/committee acquired pneumonia. Much improved after being nothing by mouth and with empiric anti-biotics which include Unasyn and azithromycin, speech to evaluate, monitor cultures. Continue supportive care with oxygen and nebulizer treatments. Will evaluate for home oxygen need.    2. Chronic dysphagia. Now evidence of aspiration. Nothing by mouth except medications, seen by speech currently nothing by mouth awaiting modified  barium swallow.    3. Sepsis as in #1 above, resolved after IV fluids with bolus and maintenance. Repeat lactate is normal.    4. History of COPD and smoking. No wheezing, supportive care as in #1 above. Counseled to quit smoking.    5. Generalized weakness and deconditioning. PT eval done and recommended SNF, social work consulted.    6. Dehydration. Clinically resolved after hydration.    7. Chronic anxiety. Home dose and as continue.   8. Hypokalemia. Replaced and stable with magnesium levels."   Irisa Grimsley D 07/20/2014, 10:31 AM      

## 2014-07-20 NOTE — Progress Notes (Signed)
SLP Cancellation Note  Patient Details Name: Phillip May MRN: [id number] DOB: April 12, 1945   Cancelled treatment:       Reason Eval/Treat Not Completed: Other (comment) (pt for surgery today per review of chart)   Donavan Burnetamara Allex Lapoint, MS [facility] SLP [phone]

## 2014-07-20 NOTE — Discharge Instructions (Signed)
WBAT with walker Keep incisions clean and dry. Change dry dressings as needed

## 2014-07-20 NOTE — Transfer of Care (Signed)
Immediate Anesthesia Transfer of Care Note  Patient: Phillip May  Procedure(s) Performed: Procedure(s): INTRAMEDULLARY (IM) NAIL INTERTROCHANTRIC LEFT HIP (Left)  Patient Location: PACU  Anesthesia Type:MAC and Spinal  Level of Consciousness: awake, alert , oriented and patient cooperative  Airway & Oxygen Therapy: Patient Spontanous Breathing and Patient connected to face mask oxygen  Post-op Assessment: Report given to RN, Post -op Vital signs reviewed and stable and Patient moving all extremities  Post vital signs: Reviewed and stable  Last Vitals:  Filed Vitals:   07/20/14 2020  BP: 140/81  Pulse:   Temp:   Resp:     Complications: No apparent anesthesia complications

## 2014-07-20 NOTE — Interval H&P Note (Signed)
History and Physical Interval Note:  07/20/2014 6:20 PM  Phillip May  has presented today for surgery, with the diagnosis of left interochantric fracture  The various methods of treatment have been discussed with the patient and family. After consideration of risks, benefits and other options for treatment, the patient has consented to  Procedure(s): INTRAMEDULLARY (IM) NAIL INTERTROCHANTRIC (Left) as a surgical intervention .  The patient's history has been reviewed, patient examined, no change in status, stable for surgery.  I have reviewed the patient's chart and labs.  Questions were answered to the patient's satisfaction.    The risks, benefits, and alternatives were discussed with the patient and family. There are risks associated with the surgery including, but not limited to, problems with anesthesia (death), infection, dislocation, differences in leg length/angulation/rotation, fracture of bones, loosening or failure of implants, hematoma (blood accumulation) which may require surgical drainage, blood clots, pulmonary embolism, nerve injury (foot drop), and blood vessel injury. The patient/family understands these risks and elects to proceed.    Marveen Donlon, Cloyde ReamsBrian Fouad

## 2014-07-20 NOTE — Consult Note (Signed)
Reason for Consult:  Left intertrochanteric hip fracture Referring Physician:  Legrand May, P  Phillip May is an 69 y.o. male.  HPI: 69yo yo male with multiple medical co-morbidities hospitalized recently for community acquired pneumonia.  Was to discharged soon.  Unfortunately it appears that he tried to get from his chair today when he stumbled and landed on his left hip.  Due to pain level and inability to bear weight, X-rays were ordered which revealed hip fracture.  Initially Dr. Carola May was consulted as he was on call.  Based on timing he asked if I would be able to address fracture earlier.  Patient reports a lot of pain in left proximal hip region.  No other injuries reported  History of cervical spine surgery in past with resulted bilateral upper extremity flexion contractures - reviewed with him as important from ambulation standpoint after operation for use of a walker  Past Medical History  Diagnosis Date  . Inguinal hernia   . COPD (chronic obstructive pulmonary disease)   . GERD (gastroesophageal reflux disease)   . Chronic hoarseness   . Tobacco abuse   . Cervical spinal stenosis     C4-C5; C5-C6; severe stenosis and abnormal  cord signal C6-C7 s/p decompression in 04/2004    Past Surgical History  Procedure Laterality Date  . Inguinal hernia repair    . Posterior laminectomy / decompression cervical spine  2006    Family History  Problem Relation Age of Onset  . Emphysema Father   . Stomach cancer Sister     Social History:  reports that he has been smoking Cigarettes.  He has a 6 pack-year smoking history. He has never used smokeless tobacco. He reports that he drinks about 7.0 oz of alcohol per week. He reports that he does not use illicit drugs.  Allergies: No Known Allergies  Medications:  I have reviewed the patient's current medications. Scheduled: . ampicillin-sulbactam (UNASYN) IV  1.5 g Intravenous Q6H  . azithromycin  500 mg Intravenous Q24H  .  budesonide-formoterol  2 puff Inhalation BID  .  ceFAZolin (ANCEF) IV  2 g Intravenous Once  . enoxaparin (LOVENOX) injection  30 mg Subcutaneous Q24H  . feeding supplement (ENSURE ENLIVE)  237 mL Oral BID BM  . levalbuterol  1.25 mg Nebulization TID  . multivitamin with minerals  1 tablet Oral Daily  . nebivolol  5 mg Oral Daily  . pneumococcal 23 valent vaccine  0.5 mL Intramuscular Tomorrow-1000  . tiotropium  18 mcg Inhalation Daily    Results for orders placed or performed during the hospital encounter of 07/17/14 (from the past 24 hour(s))  Protime-INR     Status: None   Collection Time: 07/19/14  7:06 PM  Result Value Ref Range   Prothrombin Time 14.6 11.6 - 15.2 seconds   INR 1.12 0.00 - 1.49  CBC     Status: Abnormal   Collection Time: 07/20/14  5:25 AM  Result Value Ref Range   WBC 7.9 4.0 - 10.5 K/uL   RBC 3.08 (L) 4.22 - 5.81 MIL/uL   Hemoglobin 10.4 (L) 13.0 - 17.0 g/dL   HCT 16.130.5 (L) 09.639.0 - 04.552.0 %   MCV 99.0 78.0 - 100.0 fL   MCH 33.8 26.0 - 34.0 pg   MCHC 34.1 30.0 - 36.0 g/dL   RDW 40.913.0 81.111.5 - 91.415.5 %   Platelets 275 150 - 400 K/uL  Basic metabolic panel     Status: Abnormal   Collection Time: 07/20/14  5:25 AM  Result Value Ref Range   Sodium 137 135 - 145 mmol/L   Potassium 3.8 3.5 - 5.1 mmol/L   Chloride 101 101 - 111 mmol/L   CO2 24 22 - 32 mmol/L   Glucose, Bld 109 (H) 65 - 99 mg/dL   BUN 13 6 - 20 mg/dL   Creatinine, Ser 9.14 0.61 - 1.24 mg/dL   Calcium 8.3 (L) 8.9 - 10.3 mg/dL   GFR calc non Af Amer >60 >60 mL/min   GFR calc Af Amer >60 >60 mL/min   Anion gap 12 5 - 15    X-ray: CLINICAL DATA: Fall today with left hip pain, initial encounter  EXAM: LEFT HIP (WITH PELVIS) 2-3 VIEWS  COMPARISON: None.  FINDINGS: There is an a minimally displaced intratrochanteric fracture of the proximal left femur. No dislocation is noted. Contrast material is seen from prior speech pathology examination. The pelvic ring is otherwise intact. No other  focal abnormality is seen.  IMPRESSION: Mildly displaced left intratrochanteric femoral fracture.   Electronically Signed  By: Phillip May M.D  ROS: In addition to the HPI above,  No Fever-chills, No Headache, No changes with Vision or hearing, +ve problems swallowing food or Liquids, No Chest pain,++ productive Cough & Shortness of Breath, No Abdominal pain, No Nausea or Vommitting, Bowel movements are regular, No Blood in stool or Urine, No dysuria, No new skin rashes or bruises, No new joints pains-aches,  No new weakness, tingling, numbness in any extremity, No recent weight gain or loss, No polyuria, polydypsia or polyphagia, No significant Mental Stressors.  Blood pressure 158/83, pulse 94, temperature 97.2 F (36.2 C), temperature source Oral, resp. rate 18, height  (1.702 m), weight 44.3 kg (97 lb 10.6 oz), SpO2 95 %.  Physical Exam  Awake alert, pleasant disposition Hurting trying to rest A lot of saliva around mouth  Bilateral hands and wrist contracted (since c-spine surgery 2006) Left hip shortened and externally rotated with pain with any movement  General medical exam reviewed from hospital notes since admission Obvious productive cough  Assessment/Plan: Left hip intertrochanteric proximal femur fracture  Reviewed with patient the issues at hand and the subsequent need for surgical intervention NPO Consent ordered Plan to go to OR tonight for ORIF left hip   "1. Acute on chronic respiratory failure due to aspiration/committee acquired pneumonia. Much improved after being nothing by mouth and with empiric anti-biotics which include Unasyn and azithromycin, speech to evaluate, monitor cultures. Continue supportive care with oxygen and nebulizer treatments. Will evaluate for home oxygen need.    2. Chronic dysphagia. Now evidence of aspiration. Nothing by mouth except medications, seen by speech currently nothing by mouth awaiting modified  barium swallow.    3. Sepsis as in #1 above, resolved after IV fluids with bolus and maintenance. Repeat lactate is normal.    4. History of COPD and smoking. No wheezing, supportive care as in #1 above. Counseled to quit smoking.    5. Generalized weakness and deconditioning. PT eval done and recommended SNF, social work consulted.    6. Dehydration. Clinically resolved after hydration.    7. Chronic anxiety. Home dose and as continue.   8. Hypokalemia. Replaced and stable with magnesium levels."   Faraz Ponciano D 07/20/2014, 10:31 AM

## 2014-07-20 NOTE — Anesthesia Preprocedure Evaluation (Addendum)
Anesthesia Evaluation  Patient identified by MRN, date of birth, ID band Patient awake    Reviewed: Allergy & Precautions, NPO status , Patient's Chart, lab work & pertinent test results  Airway Mallampati: I  TM Distance: >3 FB Neck ROM: Full    Dental   Pulmonary pneumonia -, unresolved, COPDCurrent Smoker,    Pulmonary exam normal       Cardiovascular Normal cardiovascular exam    Neuro/Psych    GI/Hepatic GERD-  Medicated and Controlled,  Endo/Other    Renal/GU      Musculoskeletal   Abdominal   Peds  Hematology   Anesthesia Other Findings   Reproductive/Obstetrics                           Anesthesia Physical Anesthesia Plan  ASA: III and emergent  Anesthesia Plan: Spinal   Post-op Pain Management:    Induction: Intravenous  Airway Management Planned: Natural Airway and Simple Face Mask  Additional Equipment:   Intra-op Plan:   Post-operative Plan:   Informed Consent: I have reviewed the patients History and Physical, chart, labs and discussed the procedure including the risks, benefits and alternatives for the proposed anesthesia with the patient or authorized representative who has indicated his/her understanding and acceptance.     Plan Discussed with: CRNA and Surgeon  Anesthesia Plan Comments:        Anesthesia Quick Evaluation

## 2014-07-21 ENCOUNTER — Inpatient Hospital Stay (HOSPITAL_COMMUNITY): Payer: Medicare Other

## 2014-07-21 ENCOUNTER — Encounter (HOSPITAL_COMMUNITY): Payer: Self-pay | Admitting: Orthopedic Surgery

## 2014-07-21 DIAGNOSIS — A419 Sepsis, unspecified organism: Secondary | ICD-10-CM | POA: Diagnosis not present

## 2014-07-21 LAB — BASIC METABOLIC PANEL
ANION GAP: 11 (ref 5–15)
BUN: 12 mg/dL (ref 6–20)
CALCIUM: 8.1 mg/dL — AB (ref 8.9–10.3)
CHLORIDE: 100 mmol/L — AB (ref 101–111)
CO2: 26 mmol/L (ref 22–32)
CREATININE: 0.88 mg/dL (ref 0.61–1.24)
GFR calc Af Amer: >60 mL/min (ref >60)
GLUCOSE: 91 mg/dL (ref 65–99)
Potassium: 3.6 mmol/L (ref 3.5–5.1)
Sodium: 137 mmol/L (ref 135–145)

## 2014-07-21 LAB — CBC
HCT: 26.8 % — ABNORMAL LOW (ref 39.0–52.0)
HEMOGLOBIN: 9 g/dL — AB (ref 13.0–17.0)
MCH: 33.5 pg (ref 26.0–34.0)
MCHC: 33.6 g/dL (ref 30.0–36.0)
MCV: 99.6 fL (ref 78.0–100.0)
Platelets: 244 10*3/uL (ref 150–400)
RBC: 2.69 MIL/uL — AB (ref 4.22–5.81)
RDW: 13 % (ref 11.5–15.5)
WBC: 7.9 10*3/uL (ref 4.0–10.5)

## 2014-07-21 LAB — VITAMIN D 25 HYDROXY (VIT D DEFICIENCY, FRACTURES): VIT D 25 HYDROXY: 8.4 ng/mL — AB (ref 30.0–100.0)

## 2014-07-21 LAB — MAGNESIUM: MAGNESIUM: 1.3 mg/dL — AB (ref 1.7–2.4)

## 2014-07-21 MED ORDER — SODIUM CHLORIDE 0.9 % IV BOLUS (SEPSIS)
500.0000 mL | Freq: Once | INTRAVENOUS | Status: AC
  Administered 2014-07-21: 500 mL via INTRAVENOUS

## 2014-07-21 MED ORDER — MAGNESIUM SULFATE 2 GM/50ML IV SOLN
2.0000 g | Freq: Once | INTRAVENOUS | Status: AC
  Administered 2014-07-21: 2 g via INTRAVENOUS
  Filled 2014-07-21: qty 50

## 2014-07-21 MED ORDER — SODIUM CHLORIDE 0.9 % IV BOLUS (SEPSIS)
250.0000 mL | Freq: Once | INTRAVENOUS | Status: AC
  Administered 2014-07-21: 250 mL via INTRAVENOUS

## 2014-07-21 MED ORDER — LORAZEPAM 2 MG/ML IJ SOLN
1.0000 mg | Freq: Four times a day (QID) | INTRAMUSCULAR | Status: AC
  Administered 2014-07-21: 1 mg via INTRAVENOUS
  Filled 2014-07-21 (×2): qty 1

## 2014-07-21 MED ORDER — SODIUM CHLORIDE 0.9 % IV SOLN
INTRAVENOUS | Status: AC
  Administered 2014-07-21 (×2): via INTRAVENOUS

## 2014-07-21 NOTE — Op Note (Signed)
NAME:  Phillip May, Phillip May               ACCOUNT NO.:  [id number]  MEDICAL RECORD NO.:  [id number]  LOCATION:  1224                         [facility]  PHYSICIAN:  Samson FredericBrian Dezmen Alcock, MD     DATE OF BIRTH:  1945/02/18  DATE OF PROCEDURE:  07/20/2014 DATE OF DISCHARGE:                              OPERATIVE REPORT   SURGEON:  Samson FredericBrian Dream Harman, MD  ASSISTANT:  Donnie Coffinhomas B. Dixon, PA-C  PREOPERATIVE DIAGNOSIS:  Left intertrochanteric femur fracture with subtrochanteric extension.  POSTOPERATIVE DIAGNOSIS:  Left intertrochanteric femur fracture with subtrochanteric extension.  PROCEDURE PERFORMED:  Cephalomedullary nail fixation of left pertrochanteric femur fracture.  IMPLANTS: 1. Synthes TFNA 11 x 400-mm nail with 130 degree angle. 2. Helical blade, 95 mm. 3. A 5-mm x 44-mm distal interlocking screw x1.  ANESTHESIA:  Spinal.  EBL:  100 mL.  SPECIMENS:  None.  TUBES AND DRAINS:  None.  COMPLICATIONS:  None.  DISPOSITION:  Stable to PACU.  INDICATIONS:  The patient is a 69 year old male who was admitted to the hospital last week for pneumonia and sepsis.  The patient was improving medically; however, he was having delirium, presumed to be from alcohol withdrawal.  He sustained a ground-level fall in his hospital room yesterday and Orthopedics was consulted.  Risks, benefits and alternatives to surgical fixation of his left hip fracture were explained and the patient and his family elected to proceed.  DESCRIPTION OF PROCEDURE IN DETAIL:  The patient was correctly identified in the preop holding area using two identifiers.  I marked the surgical site.  The patient was taken to the operating room, spinal anesthesia was induced on his bed.  [facility]  The right lower extremity was scissored underneath the left lower extremity.  The fracture was reduced with traction, internal rotation and adduction.  The left hip was prepped and draped in a  normal sterile surgical fashion.  Time-out was called verifying the site and site of surgery.  He did receive 1 g of Ancef within 60 minutes at the beginning of the procedure.  I began by using fluoroscopy to define his anatomy.  A 4-cm incision was made proximal to the tip of the greater trochanter.  I used a guidepin to establish the proper entry point for a trochanteric nail.  Entry reamer was used.  Guidewire was placed and the nail was measured.  I reamed up to a 12.5 and placed an 11-mm nail.  The fracture had an acceptable reduction through a separate stab incision. Through the cannula, I placed a guidepin for the cephalomedullary device.  The guidepin length was checked on AP and lateral fluoroscopy views.  I then measured the length of the helical blade and the outer cortex was penetrated with a drill.  The helical blade was impacted into place and I used live fluoroscopy to ensure that there was no chondral penetration, tip apex distance was appropriate.  I removed the jig after tightening the set screw.  I then turned my attention distally through a separate stab incision.  I placed 1 distal interlocking screw after releasing traction.  The wounds were then copiously irrigated with saline.  They  were closed in layers using 0 Vicryl for the fascia, 2-0 Monocryl for the deep dermal layer and running Monocryl 3-0 subcuticular stitch.  Glue was applied to the skin and once it was fully hardened, a sterile dressing was applied.  The patient was then aroused from anesthesia, transferred to the PACU in stable condition.  Sponge, needle, and instrument counts were correct at the end of the case x2.  The plan will be to readmit the patient to the hospital.  He may weightbear as tolerated with the walker.  He received physical therapy. He will return to the Hospitalist Service.  We will start Lovenox for DVT prophylaxis.  I will see him back in 2 weeks for repeat clinical  and radiographic evaluation.  All questions were solicited and answered to the family's satisfaction.          ______________________________ [name], MD     BS/MEDQ  D:  07/20/2014  T:  07/21/2014  Job:  [id number]

## 2014-07-21 NOTE — Progress Notes (Signed)
     Subjective: 1 Day Post-Op Procedure(s) (LRB): INTRAMEDULLARY (IM) NAIL INTERTROCHANTRIC LEFT HIP (Left)   Patient reports pain as mild, pain controlled. States that the hip feels better than it did prior to surgery.  Objective:   VITALS:   Filed Vitals:   07/21/14  BP: 121/75  Pulse: 86  Temp: 97.9 F (36.6 C)  Resp: 21    Dorsiflexion/Plantar flexion intact Incision: dressing C/D/I No cellulitis present Compartment soft  LABS  Recent Labs  07/20/14 0525 07/21/14 0725  HGB 10.4* 9.0*  HCT 30.5* 26.8*  WBC 7.9 7.9  PLT 275 244     Recent Labs  07/19/14 0400 07/20/14 0525 07/21/14 0725  NA  --  137 137  K 3.7 3.8 3.6  BUN  --  13 12  CREATININE  --  0.95 0.88  GLUCOSE  --  109* 91     Assessment/Plan: 1 Day Post-Op Procedure(s) (LRB): INTRAMEDULLARY (IM) NAIL INTERTROCHANTRIC LEFT HIP (Left)  Up with therapy WBAT on the left leg      Phillip AuerbachMatthew S. Laney May   PAC  07/21/2014, 9:38 AM

## 2014-07-21 NOTE — Progress Notes (Signed)
CSW assisting with d/c planning. Updated clinicals have been sent to [facility] for review. SNF would like to accept pt if they have an opening at time of d/c. CSW will continue to follow to assist with d/c planning needs.  Cori RazorJamie Alysabeth Scalia LCSW [phone]

## 2014-07-21 NOTE — Progress Notes (Signed)
Rt gave pt flutter valve. Pt knows and understands how to use. I do think pt will have to be remained to do. No distress at this time.

## 2014-07-21 NOTE — Evaluation (Addendum)
Physical Therapy Evaluation Patient Details Name: Phillip May MRN: [id number] DOB: 1945/07/18 Today's Date: 07/21/2014   History of Present Illness  69 yo male admitted 07/17/14 with Pna, fall. Hx of COPD, cervical spinal stenosis. Pt is from home alone. Pt s/p fall on 07/19/14 with L intertrochantric fracture and underwent IM nailing.  Clinical Impression  Pt admitted with above diagnosis. Pt currently with functional limitations due to the deficits listed below (see PT Problem List). +2 total assist for supine to sit and sit to stand, pt unable to stand fully upright with RW and B knees buckled, so returned to bed.  Pt will benefit from skilled PT to increase their independence and safety with mobility to allow discharge to the venue listed below.   *    Follow Up Recommendations SNF    Equipment Recommendations  None recommended by PT    Recommendations for Other Services OT consult     Precautions / Restrictions Precautions Precautions: Fall Restrictions Weight Bearing Restrictions: No Other Position/Activity Restrictions: WBAT L LE      Mobility  Bed Mobility Overal bed mobility: Needs Assistance;+2 for physical assistance Bed Mobility: Rolling Rolling: +2 for physical assistance;Max assist   Supine to sit: +2 for physical assistance;Total assist     General bed mobility comments: pt very fearful with all mobility. Max multimodal cues for pt to try to self help with mobility. +2 total pt 10% for supine to EOb with assist for trunk to upright and use of pad, assist for LEs over to EOB.  Transfers Overall transfer level: Needs assistance Equipment used: Rolling walker (2 wheeled) Transfers: Sit to/from Stand Sit to Stand: +2 physical assistance;Total assist         General transfer comment: assist to rise, pt very anxious and only able to briefly stand but pt not properly gripping to walker and LEs buckling.   Ambulation/Gait                Stairs             Wheelchair Mobility    Modified Rankin (Stroke Patients Only)       Balance     Sitting balance-Leahy Scale: Zero Sitting balance - Comments: pt with strong posterior lean in sitting. Max assist of 2 to reposition to upright     Standing balance-Leahy Scale: Zero                               Pertinent Vitals/Pain Pain Assessment: 0-10 Pain Score: 8  Pain Location: abdomen and L hip with movement. Pain Intervention(s): Patient requesting pain meds-RN notified;Repositioned    Home Living Family/patient expects to be discharged to:: Skilled nursing facility Living Arrangements: Alone   Type of Home: House Home Access: Stairs to enter Entrance Stairs-Rails: Right Entrance Stairs-Number of Steps: 5 Home Layout: One level Home Equipment: Walker - 2 wheels;Cane - single point;Shower seat      Prior Function Level of Independence: Needs assistance   Gait / Transfers Assistance Needed: uses RW  ADL's / Homemaking Assistance Needed: neighbors help with ADLs/meals-checks in daily        Hand Dominance        Extremity/Trunk Assessment   Upper Extremity Assessment: Generalized weakness           Lower Extremity Assessment: LLE deficits/detail   LLE Deficits / Details: hip AAROM limited 50% by pain, knee ext -3/5, ankle [facility]  Cervical / Trunk Assessment: Kyphotic  Communication   Communication: No difficulties  Cognition Arousal/Alertness: Awake/alert Behavior During Therapy: Anxious Overall Cognitive Status: No family/caregiver present to determine baseline cognitive functioning                      General Comments      Exercises General Exercises - Lower Extremity Ankle Circles/Pumps: AROM;Both;5 reps;Supine Heel Slides: AAROM;Left;10 reps;Supine Hip ABduction/ADduction: AAROM;Left;10 reps;Supine      Assessment/Plan    PT Assessment Patient needs continued PT services  PT Diagnosis Difficulty walking;Generalized  weakness;Acute pain   PT Problem List Decreased strength;Decreased activity tolerance;Decreased balance;Decreased mobility;Decreased knowledge of use of DME;Decreased cognition  PT Treatment Interventions DME instruction;Gait training;Functional mobility training;Therapeutic activities;Therapeutic exercise;Patient/family education;Balance training   PT Goals (Current goals can be found in the Care Plan section) Acute Rehab PT Goals Patient Stated Goal: none stated.    Frequency Min 3X/week   Barriers to discharge Decreased caregiver support      Co-evaluation PT/OT/SLP Co-Evaluation/Treatment: Yes Reason for Co-Treatment: Complexity of the patient's impairments (multi-system involvement);For patient/therapist safety PT goals addressed during session: Mobility/safety with mobility         End of Session Equipment Utilized During Treatment: Gait belt Activity Tolerance: Patient limited by fatigue Patient left: in bed;with call bell/phone within reach;with bed alarm set Nurse Communication: Mobility status;Need for lift equipment         Time: [id number] PT Time Calculation (min) (ACUTE ONLY): 26 min   Charges:   PT Evaluation $PT RE-EvalI: 1 Procedure PT Treatments $Therapeutic Activity: 8-22 mins   PT G Codes:        Tamala SerUhlenberg, Mariena Meares Kistler 07/21/2014, 12:31 PM [phone]

## 2014-07-21 NOTE — Evaluation (Addendum)
Occupational Therapy Evaluation Patient Details Name: Phillip May MRN: [id number] DOB: Aug 02, 1945 Today's Date: 07/21/2014    History of Present Illness 69 yo male admitted 07/17/14 with Pna, fall. Hx of COPD, cervical spinal stenosis. Pt is from home alone. Pt s/p fall on 07/19/14 with L intertrochantric fracture and underwent IM nailing.   Clinical Impression   Pt is anxious/fearful of falling and becomes rigid during attempts to mobilize. Requires +2 total assist with functional ADL at this time and will need SNF at d/c. Unable to safely assist OOB to chair today. Only able to briefly stand with +2 assist to attempt periarea hygiene and the rest completed at bedlevel. Will follow to progress ADL independence.     Follow Up Recommendations  SNF;Supervision/Assistance - 24 hour    Equipment Recommendations  3 in 1 bedside comode    Recommendations for Other Services       Precautions / Restrictions Precautions Precautions: Fall Restrictions Weight Bearing Restrictions: No Other Position/Activity Restrictions: WBAT L LE      Mobility Bed Mobility Overal bed mobility: Needs Assistance;+2 for physical assistance Bed Mobility: Rolling Rolling: +2 for physical assistance;Max assist   Supine to sit: +2 for physical assistance;Total assist     General bed mobility comments: pt very fearful with all mobility. Max multimodal cues for pt to try to self help with mobility. +2 total pt 10% for supine to EOb with assist for trunk to upright and use of pad, assist for LEs over to EOB.  Transfers Overall transfer level: Needs assistance Equipment used: Rolling walker (2 wheeled) Transfers: Sit to/from Stand Sit to Stand: +2 physical assistance;Total assist         General transfer comment: assist to rise, pt very anxious and only able to briefly stand but pt not properly gripping to walker and LEs buckling.     Balance     Sitting balance-Leahy Scale: Zero Sitting balance -  Comments: pt with strong posterior lean in sitting. Max assist of 2 to reposition to upright     Standing balance-Leahy Scale: Zero                              ADL Overall ADL's : Needs assistance/impaired Eating/Feeding: NPO   Grooming: Wash/dry face;Set up;Bed level   Upper Body Bathing: Moderate assistance;Bed level   Lower Body Bathing: +2 for physical assistance;Total assistance;Bed level   Upper Body Dressing : Moderate assistance;Bed level   Lower Body Dressing: +2 for physical assistance;Total assistance;Bed level                 General ADL Comments: Assisted pt to EOB but pt becoming anxious and calling out in pain. Requires +2 assist to maintain sitting balance as pt leaning heavily posterior. Noted pt with BM and attempted to stand but pt very anxious and unable to fully stand, with LEs buckling, so sat back down and assisted pt to supine. Rolled R and L for periarea hygiene/cleanup.  Nursing made aware of need for lift if OOB. Pt able to be calmed back down and made comfortable in bed. Pt stating abdomen is 8/10 and pain in L hip with movement. Nursing in room and aware.      Vision     Perception     Praxis      Pertinent Vitals/Pain Pain Assessment: 0-10 Pain Score: 8  Pain Location: abdomen and L hip with movement. Pain Intervention(s): Patient  requesting pain meds-RN notified;Repositioned     Hand Dominance     Extremity/Trunk Assessment Upper Extremity Assessment Upper Extremity Assessment: Generalized weakness           Communication Communication Communication: No difficulties   Cognition Arousal/Alertness: Awake/alert Behavior During Therapy: Anxious Overall Cognitive Status: No family/caregiver present to determine baseline cognitive functioning                     General Comments       Exercises       Shoulder Instructions      Home Living Family/patient expects to be discharged to:: Skilled nursing  facility Living Arrangements: Alone   Type of Home: House Home Access: Stairs to enter Enter[employer] CorporationEntrance Stairs-Number of Steps: 5 Entrance Stairs-Rails: Right Home Layout: One level     Bathroom Shower/Tub: Chief Strategy OfficerTub/shower unit   Bathroom Toilet: Standard     Home Equipment: Environmental consultantWalker - 2 wheels;Cane - single point;Shower seat          Prior Functioning/Environment Level of Independence: Needs assistance  Gait / Transfers Assistance Needed: per pt, uses walker more inside and outside of home ADL's / Homemaking Assistance Needed: neighbors help with ADLs/meals-checks in daily        OT Diagnosis: Generalized weakness;Acute pain   OT Problem List: Decreased strength;Decreased knowledge of use of DME or AE;Pain   OT Treatment/Interventions: Self-care/ADL training;Patient/family education;Therapeutic activities;DME and/or AE instruction    OT Goals(Current goals can be found in the care plan section) Acute Rehab OT Goals Patient Stated Goal: none stated. OT Goal Formulation: With patient Time For Goal Achievement: 07/28/14 Potential to Achieve Goals: Good  OT Frequency: Min 2X/week   Barriers to D/C:            Co-evaluation              End of Session Equipment Utilized During Treatment: Gait belt;Rolling walker  Activity Tolerance: Patient limited by pain;Other (comment) (pt anxious) Patient left: in bed;with call bell/phone within reach;with bed alarm set   Time: [id number] OT Time Calculation (min): 39 min Charges:  OT General Charges $OT Visit: 1 Procedure OT Evaluation $Initial OT Evaluation Tier I: 1 Procedure OT Treatments $Therapeutic Activity: 8-22 mins G-Codes:    Lennox LaityStone, Casee Knepp [place]  [phone] 07/21/2014, 12:21 PM

## 2014-07-21 NOTE — Progress Notes (Signed)
**Note Phillip-Identified via Obfuscation** Patient Demographics  Phillip May, is a 69 y.o. male, DOB - August 16, 1945, [name]  Admit date - 07/17/2014   Admitting Physician Leroy Sea, MD  Outpatient Primary MD for the patient is Londell Moh, MD  LOS - 4   Chief Complaint  Patient presents with  . Shortness of Breath      Summary   Phillip May is a 69 y.o. male, history of COPD follows with Dr. Marchelle Gearing not on oxygen, ongoing smoking, occasional alcohol use, history of dysphagia on a soft diet per speech therapy eval in 2014, chronic hoarse voice, GERD, generalized weakness and deconditioning. Who lives at home by himself with help from neighbors and getting groceries and activities of daily living.  Patient was brought into the ER after a neighbor found him on the floor of his kitchen, and apparently patient was feeling weak and sat himself on the floor, he did not hit the floor or hurt himself. He states that he has been gradually developing dysphagia over the course of few years, likely aspirated and choked on food about a week ago, since then has had a productive cough with some shortness of breath, no fevers, has been not eating or drinking well since that time and getting gradually weak. Finally slumped to the floor as above and was brought to the ER.  In the ER workups at assistive of acute on chronic respiratory failure due to aspiration/community-acquired pneumonia. He also had signs of dehydration with elevated lactate/early sepsis. I was called to admit the patient.  2 days into the admission Patient got confused, fell, L Hip fracture, discussed his case with his daughter and son, now being told that he was actually drinking heavily and not once a week as he told during admission. They want him to be full code.  They have been told clearly that he will be a high-risk candidate for adverse cardiopulmonary outcome during the perioperative period. They understand this and accept the risks and benefits.   Subjective:   Phillip May today has, No headache, No chest pain, No abdominal pain - No Nausea, No new weakness tingling or numbness, much improved Cough - SOB. L hip pain,feels better today.  Assessment & Plan    1. L hip fracture due to fall caused by Dilirium - treat as post ORIF on 07/20/2014 by Dr. [name], mild perioperative blood loss related anemia, initiate PT will require placement. Her orthopedics Lovenox for DVT prophylaxis, weightbearing as tolerated.   2. Acute on chronic respiratory failure due to aspiration/committee acquired pneumonia. Much improved after being nothing by mouth and with empiric anti-biotics which include Unasyn and azithromycin, speech to evaluate, monitor cultures. Continue supportive care with oxygen and nebulizer treatments. Currently off oxygen.    3. Chronic dysphagia H/O esophageal diverticulum. Now evidence of aspiration. Nothing by mouth except medications, seen by speech currently nothing by mouth awaiting modified barium swallow.    4. Sepsis as in #1 above, resolved after IV fluids with bolus and maintenance. Repeat lactate is normal.    5. History of COPD and smoking. No wheezing, supportive care as in #1 above. Counseled to quit smoking.    6. Generalized weakness and deconditioning. PT eval done and recommended SNF, social work  consulted.    7. Dehydration. Clinically resolved after hydration.   8. Chronic anxiety. Home dose and as continue.   9. Hypokalemia. Replaced and stable with magnesium levels.   10. Alcohol withdrawal. Place on CIWA protocol. Counseled.    Code Status: DNR  Family Communication: None present  Disposition Plan: SNF   Consults Speech, PT   Procedures  L ORIF by Dr. Linna Caprice on  07/20/2014   DVT Prophylaxis  Lovenox    Lab Results  Component Value Date   PLT 244 07/21/2014    Medications  Scheduled Meds: . ampicillin-sulbactam (UNASYN) IV  1.5 g Intravenous Q6H  . antiseptic oral rinse  7 mL Mouth Rinse q12n4p  . azithromycin  500 mg Intravenous Q24H  . budesonide-formoterol  2 puff Inhalation BID  . chlorhexidine  15 mL Mouth Rinse BID  . enoxaparin (LOVENOX) injection  30 mg Subcutaneous Q24H  . feeding supplement (ENSURE ENLIVE)  237 mL Oral BID BM  . folic acid  1 mg Oral Daily  . levalbuterol  1.25 mg Nebulization TID  . multivitamin with minerals  1 tablet Oral Daily  . nebivolol  5 mg Oral Daily  . pneumococcal 23 valent vaccine  0.5 mL Intramuscular Tomorrow-1000  . thiamine  100 mg Oral Daily   Or  . thiamine  100 mg Intravenous Daily  . tiotropium  18 mcg Inhalation Daily   Continuous Infusions:   PRN Meds:.acetaminophen **OR** acetaminophen, albuterol, guaiFENesin, haloperidol lactate, hydrALAZINE, HYDROcodone-acetaminophen, LORazepam **OR** LORazepam, menthol-cetylpyridinium **OR** phenol, metoCLOPramide **OR** metoCLOPramide (REGLAN) injection, metoprolol, morphine injection, ondansetron **OR** ondansetron (ZOFRAN) IV  Antibiotics     Anti-infectives    Start     Dose/Rate Route Frequency Ordered Stop   07/21/14 0000  ceFAZolin (ANCEF) IVPB 2 g/50 mL premix     2 g 100 mL/hr over 30 Minutes Intravenous Every 6 hours 07/20/14 2139 07/21/14 0646   07/20/14 1000  ceFAZolin (ANCEF) IVPB 2 g/50 mL premix     2 g 100 mL/hr over 30 Minutes Intravenous  Once 07/19/14 2133 07/20/14 1833   07/19/14 1000  ampicillin-sulbactam (UNASYN) 1.5 g in sodium chloride 0.9 % 50 mL IVPB     1.5 g 100 mL/hr over 30 Minutes Intravenous Every 6 hours 07/19/14 0812     07/18/14 1400  azithromycin (ZITHROMAX) 500 mg in dextrose 5 % 250 mL IVPB     500 mg 250 mL/hr over 60 Minutes Intravenous Every 24 hours 07/17/14 1612     07/18/14 0200   ampicillin-sulbactam (UNASYN) 1.5 g in sodium chloride 0.9 % 50 mL IVPB  Status:  Discontinued     1.5 g 100 mL/hr over 30 Minutes Intravenous Every 8 hours 07/17/14 1621 07/19/14 0812   07/18/14 0000  azithromycin (ZITHROMAX) 500 mg in dextrose 5 % 250 mL IVPB  Status:  Discontinued     500 mg 250 mL/hr over 60 Minutes Intravenous Every 24 hours 07/17/14 1344 07/17/14 1612   07/17/14 1500  Ampicillin-Sulbactam (UNASYN) 3 g in sodium chloride 0.9 % 100 mL IVPB     3 g 100 mL/hr over 60 Minutes Intravenous  Once 07/17/14 1432 07/17/14 1900   07/17/14 1230  cefTRIAXone (ROCEPHIN) 1 g in dextrose 5 % 50 mL IVPB  Status:  Discontinued     1 g 100 mL/hr over 30 Minutes Intravenous  Once 07/17/14 1219 07/17/14 1416   07/17/14 1230  azithromycin (ZITHROMAX) 500 mg in dextrose 5 % 250 mL IVPB     500  mg 250 mL/hr over 60 Minutes Intravenous  Once 07/17/14 1219 07/17/14 1550        Objective:   Filed Vitals:   07/21/14 0200 07/21/14 0400 07/21/14 0740 07/21/14 0800  BP:  121/75    Pulse:  86    Temp:  99.7 F (37.6 C)  97.9 F (36.6 C)  TempSrc:  Axillary  Oral  Resp: 22 21    Height:      Weight:      SpO2:  100% 100%     Wt Readings from Last 3 Encounters:  07/17/14 44.3 kg (97 lb 10.6 oz)  05/21/11 57.153 kg (126 lb)  04/23/11 59.421 kg (131 lb)     Intake/Output Summary (Last 24 hours) at 07/21/14 0902 Last data filed at 07/21/14 0616  Gross per 24 hour  Intake   2250 ml  Output    410 ml  Net   1840 ml     Physical Exam  Awake,Oriented X2, No new F.N deficits, Normal affect [place].AT,PERRAL Supple Neck,No JVD, No cervical lymphadenopathy appriciated.  Symmetrical Chest wall movement, Good air movement bilaterally, few R Basilar rales RRR,No Gallops,Rubs or new Murmurs, No Parasternal Heave +ve B.Sounds, Abd Soft, No tenderness, No organomegaly appriciated, No rebound - guarding or rigidity. No Cyanosis, Clubbing or edema, No new Rash or bruise , left hip incision  appears stable   Data Review   Micro Results Recent Results (from the past 240 hour(s))  Blood culture (routine x 2)     Status: None (Preliminary result)   Collection Time: 07/17/14  1:28 PM  Result Value Ref Range Status   Specimen Description BLOOD LEFT HAND  Final   Special Requests BOTTLES DRAWN AEROBIC ONLY 2CC  Final   Culture   Final           BLOOD CULTURE RECEIVED NO GROWTH TO DATE CULTURE WILL BE HELD FOR 5 DAYS BEFORE ISSUING A FINAL NEGATIVE REPORT Note: Culture results may be compromised due to an inadequate volume of blood received in culture bottles. Performed at [facility]    Report Status PENDING  Incomplete  Blood culture (routine x 2)     Status: None (Preliminary result)   Collection Time: 07/17/14  1:39 PM  Result Value Ref Range Status   Specimen Description BLOOD RIGHT HAND  Final   Special Requests BOTTLES DRAWN AEROBIC ONLY 2CC  Final   Culture   Final           BLOOD CULTURE RECEIVED NO GROWTH TO DATE CULTURE WILL BE HELD FOR 5 DAYS BEFORE ISSUING A FINAL NEGATIVE REPORT Performed at [facility]    Report Status PENDING  Incomplete  Urine culture     Status: None   Collection Time: 07/17/14  4:40 PM  Result Value Ref Range Status   Specimen Description URINE, CLEAN CATCH  Final   Special Requests NONE  Final   Colony Count NO GROWTH Performed at [facility]   Final   Culture NO GROWTH Performed at [facility]   Final   Report Status 07/18/2014 FINAL  Final  Culture, respiratory (NON-Expectorated)     Status: None   Collection Time: 07/17/14  5:00 PM  Result Value Ref Range Status   Specimen Description SPUTUM  Final   Special Requests NONE  Final   Gram Stain   Final    NO WBC SEEN RARE SQUAMOUS EPITHELIAL CELLS PRESENT FEW GRAM POSITIVE COCCI IN PAIRS RARE GRAM  POSITIVE RODS [facility]    Culture   Final    NORMAL OROPHARYNGEAL FLORA Performed at [facility]     Report Status 07/20/2014 FINAL  Final    Radiology Reports Ct Head Wo Contrast  07/19/2014   CLINICAL DATA:  Headache, hallucinating objects  EXAM: CT HEAD WITHOUT CONTRAST  TECHNIQUE: Contiguous axial images were obtained from the base of the skull through the vertex without intravenous contrast.  COMPARISON:  None  FINDINGS: There is no evidence of mass effect, midline shift, or extra-axial fluid collections. There is no evidence of a space-occupying lesion or intracranial hemorrhage. There is no evidence of a cortical-based area of acute infarction. 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 There is generalized cerebral atrophy. There is periventricular white matter low attenuation likely secondary to microangiopathy.  The ventricles and sulci are appropriate for the patient's age. The basal cisterns are patent.  Visualized portions of the orbits are unremarkable. There is near complete opacification of the left maxillary sinus. There is an air-fluid level in the right sphenoid sinus. There is mild left ethmoid sinus mucosal thickening. Cerebrovascular atherosclerotic calcifications are noted.  The osseous structures are unremarkable.  IMPRESSION: 1. No acute intracranial pathology. 2. Chronic microvascular disease and cerebral atrophy. 3. Right sphenoid and left maxillary sinus disease. Mild left ethmoid sinus disease.   Electronically Signed   By: Elige KoHetal  Patel   On: 07/19/2014 18:01   Pelvis Portable  07/20/2014   CLINICAL DATA:  69 year old status post left hip repair  EXAM: PORTABLE PELVIS 1-2 VIEWS  COMPARISON:  Preoperative radiographs 07/19/2014  FINDINGS: Interval ORIF of intertrochanteric left femoral fracture with intra medullary nail and a transfemoral neck gamma nail. Improved alignment of the fracture fragments. No evidence of acute hardware complication. Expected postoperative subcutaneous emphysema. Oral contrast material opacifies the colon. Scattered atherosclerotic vascular  calcifications are noted.  IMPRESSION: ORIF left intertrochanteric femoral fracture without evidence of complication.   Electronically Signed   By: Malachy MoanHeath  McCullough M.D.   On: 07/20/2014 21:15   Dg Chest Port 1 View  07/17/2014   CLINICAL DATA:  Pt found on kitchen floor. Believed to be there for approx 4 hrs. Sts he fell but sat himself down, wasn't a "crash." Complains of sob and congestion x3 days. Was too weak to stand up from fall. Has been taking mucinex. Per neighbor, he started coughing up blood clots yesterday.  EXAM: PORTABLE CHEST - 1 VIEW  COMPARISON:  04/27/2004  FINDINGS: There is dense consolidation in knee right lower lung, projecting in the right lower lobe. This is new from the prior study.  Lungs are hyperexpanded consistent with underlying COPD. No other lung consolidation. No pulmonary edema. No pleural effusion or pneumothorax.  Cardiac silhouette is normal in size. Aorta is uncoiled. No mediastinal or hilar masses or convincing adenopathy.  Bony thorax is demineralized but intact.  IMPRESSION: Right lower lobe consolidation consistent with pneumonia. Underlying COPD.   Electronically Signed   By: Amie Portlandavid  Ormond M.D.   On: 07/17/2014 11:58   Dg Knee Left Port  07/19/2014   CLINICAL DATA:  Status post fall.  EXAM: PORTABLE LEFT KNEE - 1-2 VIEW  COMPARISON:  None.  FINDINGS: There is no evidence of fracture, dislocation, or joint effusion. There is generalized osteopenia. There is no evidence of arthropathy or other focal bone abnormality. Soft tissues are unremarkable. There is peripheral vascular atherosclerotic disease.  IMPRESSION: No acute osseous injury of the left knee.  Electronically Signed   By: Elige Ko   On: 07/19/2014 20:25   Dg Swallowing Func-speech Pathology  07/19/2014    Objective Swallowing Evaluation:    Patient Details  Name: ATA PECHA MRN: [id number] Date of Birth: 12-Jul-1945  Today's Date: 07/19/2014 Time: SLP Start Time (ACUTE ONLY): 1120-SLP Stop Time (ACUTE  ONLY): 1155 SLP Time Calculation (min) (ACUTE ONLY): 35 min  Past Medical History:  Past Medical History  Diagnosis Date  . Inguinal hernia   . COPD (chronic obstructive pulmonary disease)   . GERD (gastroesophageal reflux disease)   . Chronic hoarseness   . Tobacco abuse   . Cervical spinal stenosis     C4-C5; C5-C6; severe stenosis and abnormal  cord signal C6-C7 s/p  decompression in 04/2004   Past Surgical History:  Past Surgical History  Procedure Laterality Date  . Inguinal hernia repair    . Posterior laminectomy / decompression cervical spine  2006   HPI:  Other Pertinent Information: Sione Baumgarten is a 69 y.o. male, history of  COPD (not on oxygen), ongoing smoking, occasional alcohol use, history of  dysphagia on a soft diet per MBS in September 2014, chronic hoarse voice,  GERD, generalized weakness and deconditioning.  PSH + for cervical  decompression surgery in 2006.  Pt reported chronic problems with  hoarseness and dysphagia s/p cervical surgery.  He also experienced 30  pound weight loss within 3 years-pt reported in 2014.  He lives at home  alone and receives help from neighbors for getting groceries and  activities of daily living.  The MBS in September 2014 did show trace  aspiration of thins and penetration of nectar liquids.  CXR showed right  lower lobe pna.    No Data Recorded  Assessment / Plan / Recommendation CHL IP CLINICAL IMPRESSIONS 07/19/2014  Therapy Diagnosis Severe pharyngeal phase dysphagia;Severe cervical  esophageal phase dysphagia;Moderate pharyngeal phase dysphagia;Moderate  cervical esophageal phase dysphagia  Clinical Impression Known chronic dysphagia with suspected acute  exacerbation.  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    Weakness in pharyngeal contraction and tongue base retraction/poor  epiglottic deflection results in gross pharyngeal residuals that mix with  secretions without adequate pt  sensation.  With liquids, pt conducts  multiple reflexive swallows (x4 - with liquids) with extended breathhold  *presumed to be compensatory to help clear and prevent gross aspiration.   Poor hyolaryngeal elevation allows laryngeal penetration of liquids during  the swallow.  Liquid swallows helpful to reduce solid food residuals in  pharynx.    Mild amount of aspiration (silent) after the swallow noted as barium  spills posterior into open larynx from pyriform sinus.   Pt did not sense  residuals today (mixed with secretions) but cued swallow, liquid follow  solids and cued expectoration (of secretions mixed with barium) effective  to clear 80%.    Per previous MBS in 2014 *Dr Bradly Chris, radiologist stated findings  consistent with esophagram from 08/2012 indicating narrowing of lower  cervical esophagus and dilated cervical esophagus.  SLP today noted area  near cervical hardware where barium pools slightly, clears with reflexive  swallows.   Mr Leaming again reports desire to eat with known aspiration/malnutrition  risks.  Pt does admit to further weight loss of 5 pounds since 2014 and  states he has "good days and bad days" re: swallowing.  SLP  advised him  to monitor swallowing/nutrition closely to maximize  hydration/nutrition/airway safety.  Pt readily states liquid are easier  for him to consume.    To respect pt wishes, recommend dys3/thin with precautions.  Pt will  require crushed or liquid medications due to level of dysphagia.  Using  live video, educated pt to findings, effective compensations.  Also  recommend pt have suction set up to help with his secretion management.           No flowsheet data found.   CHL IP DIET RECOMMENDATION 07/19/2014  SLP Diet Recommendations Dysphagia 3 (Mech soft);Thin  Liquid Administration via cup  Medication Administration Crushed with puree  Compensations Slow rate;Small sips/bites;Follow solids with  liquid;Multiple dry swallows after each bite/sip, cough and expectorate   Postural Changes and/or Swallow Maneuvers (None)     CHL IP OTHER RECOMMENDATIONS 07/19/2014  Recommended Consults (None)  Oral Care Recommendations Oral care QID  Other Recommendations (None)     CHL IP FOLLOW UP RECOMMENDATIONS 10/28/2012  Follow up Recommendations None     CHL IP FREQUENCY AND DURATION 07/19/2014  Speech Therapy Frequency (ACUTE ONLY) min 2x/week  Treatment Duration 1 week         CHL IP REASON FOR REFERRAL 07/19/2014  Reason for Referral Objectively evaluate swallowing function     CHL IP ORAL PHASE 07/19/2014                    Oral Phase WFL                     CHL IP PHARYNGEAL PHASE 07/19/2014  Pharyngeal Phase Impaired  Pharyngeal Comment multiple swallows across consistencies, following solid  with liquids helpful to decrease vallecular residuals, cough and  expectoration helpful, limited postural changes available due to cervical  decompression sx in 06      Donavan Burnet, MS [facility] SLP [phone]    Dg C-arm 1-60 Min  07/20/2014   CLINICAL DATA:  69 year old male with left intertrochanteric femoral fracture undergoing ORIF  EXAM: DG C-ARM 61-120 MIN; LEFT FEMUR 2 VIEWS  COMPARISON:  Preoperative radiographs 07/19/2014  FINDINGS: For intraoperative spot radiographs demonstrate open reduction internal fixation of intertrochanteric femoral fracture with an intra medullary femoral nail and transfemoral neck gamma nail. No evidence of immediate hardware complication. There is a single distal interlocking screw.  IMPRESSION: ORIF of left intertrochanteric femoral fracture without evidence of immediate complication   Electronically Signed   By: Malachy Moan M.D.   On: 07/20/2014 20:39   Dg Hip Unilat With Pelvis 2-3 Views Left  07/19/2014   CLINICAL DATA:  Fall today with left hip pain, initial encounter  EXAM: LEFT HIP (WITH PELVIS) 2-3 VIEWS  COMPARISON:  None.  FINDINGS: There is an a minimally displaced intratrochanteric fracture of the proximal left femur. No dislocation is noted. Contrast  material is seen from prior speech pathology examination. The pelvic ring is otherwise intact. No other focal abnormality is seen.  IMPRESSION: Mildly displaced left intratrochanteric femoral fracture.   Electronically Signed   By: Alcide Clever M.D.   On: 07/19/2014 17:34   Dg Femur Min 2 Views Left  07/20/2014   CLINICAL DATA:  Postop left hip fracture repair  EXAM: LEFT FEMUR 2 VIEWS  COMPARISON:  07/19/2014  FINDINGS: Internal fixation across the left intertrochanteric femoral fracture. Near anatomic alignment. No hardware or bony complicating feature.  IMPRESSION: Internal fixation across the left femoral intertrochanteric fracture. No complicating feature.   Electronically Signed   By: Charlett Nose M.D.  On: 07/20/2014 21:20   Dg Femur Min 2 Views Left  07/20/2014   CLINICAL DATA:  69 year old male with left intertrochanteric femoral fracture undergoing ORIF  EXAM: DG C-ARM 61-120 MIN; LEFT FEMUR 2 VIEWS  COMPARISON:  Preoperative radiographs 07/19/2014  FINDINGS: For intraoperative spot radiographs demonstrate open reduction internal fixation of intertrochanteric femoral fracture with an intra medullary femoral nail and transfemoral neck gamma nail. No evidence of immediate hardware complication. There is a single distal interlocking screw.  IMPRESSION: ORIF of left intertrochanteric femoral fracture without evidence of immediate complication   Electronically Signed   By: Malachy Moan M.D.   On: 07/20/2014 20:39     CBC  Recent Labs Lab 07/17/14 1206 07/18/14 0805 07/20/14 0525 07/21/14 0725  WBC 11.1* 7.9 7.9 7.9  HGB 13.1 12.0* 10.4* 9.0*  HCT 37.6* 35.1* 30.5* 26.8*  PLT 251 227 275 244  MCV 97.9 98.9 99.0 99.6  MCH 34.1* 33.8 33.8 33.5  MCHC 34.8 34.2 34.1 33.6  RDW 12.9 13.0 13.0 13.0    Chemistries   Recent Labs Lab 07/17/14 1206 07/18/14 0805 07/19/14 0400 07/20/14 0525 07/21/14 0725  NA 134* 136  --  137 137  K 3.7 3.4* 3.7 3.8 3.6  CL 94* 96*  --  101 100*   CO2 31 30  --  24 26  GLUCOSE 135* 131*  --  109* 91  BUN 44* 24*  --  13 12  CREATININE 1.34* 0.88  --  0.95 0.88  CALCIUM 8.5* 8.1*  --  8.3* 8.1*  MG  --   --  1.0*  --  1.3*  AST 26  --   --   --   --   ALT 18  --   --   --   --   ALKPHOS 76  --   --   --   --   BILITOT 0.8  --   --   --   --    ------------------------------------------------------------------------------------------------------------------ estimated creatinine clearance is 50.3 mL/min (by C-G formula based on Cr of 0.88). ------------------------------------------------------------------------------------------------------------------ No results for input(s): HGBA1C in the last 72 hours. ------------------------------------------------------------------------------------------------------------------ No results for input(s): CHOL, HDL, LDLCALC, TRIG, CHOLHDL, LDLDIRECT in the last 72 hours. ------------------------------------------------------------------------------------------------------------------ No results for input(s): TSH, T4TOTAL, T3FREE, THYROIDAB in the last 72 hours.  Invalid input(s): FREET3 ------------------------------------------------------------------------------------------------------------------ No results for input(s): VITAMINB12, FOLATE, FERRITIN, TIBC, IRON, RETICCTPCT in the last 72 hours.  Coagulation profile  Recent Labs Lab 07/19/14 1906  INR 1.12    No results for input(s): DDIMER in the last 72 hours.  Cardiac Enzymes  Recent Labs Lab 07/17/14 1206  TROPONINI <0.03   ------------------------------------------------------------------------------------------------------------------ Invalid input(s): POCBNP   Time Spent in minutes   35   SINGH,PRASHANT K M.D on 07/21/2014 at 9:02 AM  Between 7am to 7pm - Pager - [phone]  After 7pm go to [email or web address] - password [facility]  [facility]   Office  [phone]

## 2014-07-21 NOTE — Progress Notes (Addendum)
Speech Language Pathology Treatment: Dysphagia  Patient Details Name: Francene CastleJames L Hollenbach MRN: [id number] DOB: October 11, 1945 Today's Date: 07/21/2014 Time: [id number] SLP Time Calculation (min) (ACUTE ONLY): 33 min  Assessment / Plan / Recommendation Clinical Impression  Suspect marginal worsening of baseline severe dysphagia since MBS 2 days prior due to weakness.  Pt denies awareness to worsening swallow ability.  Multiple swallows across all boluses is consistent with findings on prior MBS.  Today pt is overtly coughing and expectorating with ALL intake - which he states is normal.  Expectoration was effective to clear 80% of pharyngeal residuals on most recent MBS.  Using written cues and clinical explanation, SLP reviewed compensation strategies found to be effective for this pt.  Pt benefited from min cues for usage of strategies.   Currently ptdemonstrates improved secretion management compared to 2 days prior.  As pt states "it comes and goes" re: swallow ability and secretion management.   Pt again reports desire to continue po intake with known aspiration but now admits to eating being "laborious" and contributing to pt's unintentional weight loss.  He expressed desire to pursue long term alternative means of nutrition and continue po by mouth for comfort.  SLP advised pt to feeding tubes not preventing aspiration but providing nutrition and pt reported understanding to information.  Pt admits to worsening dysphagia x3 months but can not identify specific source *suspect deconditioning.    As per pt request, recommend continue po diet for enjoyment and consider long term means of nutrition for nutritional support.  SLP will continue to follow.     HPI Other Pertinent Information: De BurrsJames Flynt is a 69 y.o. male, history of COPD (not on oxygen), ongoing smoking, occasional alcohol use, history of dysphagia on a soft diet per MBS in September 2014, chronic hoarse voice, GERD, generalized weakness and  deconditioning.  PSH + for cervical decompression surgery in 2006.  Pt reported chronic problems with hoarseness and dysphagia s/p cervical surgery.  He also experienced 30 pound weight loss within 3 years-pt reported in 2014.  He lives at home alone and receives help from neighbors for getting groceries and activities of daily living.  The MBS in September 2014 did show trace aspiration of thins and penetration of nectar liquids.  CXR showed right lower lobe pna.     Pertinent Vitals Pain Assessment: 0-10 Pain Score: 8  Pain Location: abdomen and L hip with movement. Pain Intervention(s): Patient requesting pain meds-RN notified;Repositioned  SLP Plan  Continue with current plan of care    Recommendations Diet recommendations: Thin liquid;Dysphagia 3 (mechanical soft) (with risks known, per pt request) Liquids provided via: Cup Medication Administration: Crushed with puree (or liquid form until determination for alternative means of nutrition) Compensations: Slow rate;Small sips/bites;Follow solids with liquid;Multiple dry swallows after each bite/sip (cough and expectorate as needed, use oral suction) Postural Changes and/or Swallow Maneuvers: Seated upright 90 degrees;Upright 30-60 min after meal              Oral Care Recommendations: Oral care QID Follow up Recommendations: Skilled Nursing facility Plan: Continue with current plan of care    GO     Donavan Burnetamara Khameron Gruenwald, MS [facility] SLP [phone]

## 2014-07-21 NOTE — Progress Notes (Signed)
Patient ID: Phillip May, male   DOB: 11-18-1945, 69 y.o.   MRN: [id number] IR aware of request for G tube placement on pt. Per Dr. Archer AsaMcCullough pt needs limited CT abdomen to assess anatomy prior to procedure. Will order.

## 2014-07-21 NOTE — Anesthesia Postprocedure Evaluation (Signed)
Anesthesia Post Note  Patient: Phillip May  Procedure(s) Performed: Procedure(s) (LRB): INTRAMEDULLARY (IM) NAIL INTERTROCHANTRIC LEFT HIP (Left)  Anesthesia type: general  Patient location: PACU  Post pain: Pain level controlled  Post assessment: Patient's Cardiovascular Status Stable  Last Vitals:  Filed Vitals:   07/21/14 0400  BP: 121/75  Pulse: 86  Temp: 37.6 C  Resp: 21    Post vital signs: Reviewed and stable  Level of consciousness: sedated  Complications: No apparent anesthesia complications

## 2014-07-22 ENCOUNTER — Inpatient Hospital Stay (HOSPITAL_COMMUNITY): Payer: Medicare Other

## 2014-07-22 DIAGNOSIS — R1310 Dysphagia, unspecified: Secondary | ICD-10-CM | POA: Insufficient documentation

## 2014-07-22 DIAGNOSIS — E46 Unspecified protein-calorie malnutrition: Secondary | ICD-10-CM | POA: Insufficient documentation

## 2014-07-22 LAB — CBC
HEMATOCRIT: 23.5 % — AB (ref 39.0–52.0)
Hemoglobin: 7.8 g/dL — ABNORMAL LOW (ref 13.0–17.0)
MCH: 32.9 pg (ref 26.0–34.0)
MCHC: 33.2 g/dL (ref 30.0–36.0)
MCV: 99.2 fL (ref 78.0–100.0)
Platelets: 196 10*3/uL (ref 150–400)
RBC: 2.37 MIL/uL — ABNORMAL LOW (ref 4.22–5.81)
RDW: 13 % (ref 11.5–15.5)
WBC: 7.8 10*3/uL (ref 4.0–10.5)

## 2014-07-22 LAB — BASIC METABOLIC PANEL
ANION GAP: 7 (ref 5–15)
BUN: 12 mg/dL (ref 6–20)
CALCIUM: 7.7 mg/dL — AB (ref 8.9–10.3)
CO2: 27 mmol/L (ref 22–32)
CREATININE: 0.87 mg/dL (ref 0.61–1.24)
Chloride: 104 mmol/L (ref 101–111)
GFR calc Af Amer: >60 mL/min (ref >60)
GFR calc non Af Amer: >60 mL/min (ref >60)
Glucose, Bld: 89 mg/dL (ref 65–99)
POTASSIUM: 3.5 mmol/L (ref 3.5–5.1)
Sodium: 138 mmol/L (ref 135–145)

## 2014-07-22 LAB — MRSA PCR SCREENING: MRSA by PCR: NEGATIVE

## 2014-07-22 LAB — MAGNESIUM: Magnesium: 1.6 mg/dL — ABNORMAL LOW (ref 1.7–2.4)

## 2014-07-22 MED ORDER — MIDAZOLAM HCL 2 MG/2ML IJ SOLN
INTRAMUSCULAR | Status: AC
  Filled 2014-07-22: qty 6

## 2014-07-22 MED ORDER — GLUCAGON HCL RDNA (DIAGNOSTIC) 1 MG IJ SOLR
INTRAMUSCULAR | Status: DC
Start: 2014-07-22 — End: 2014-07-22
  Filled 2014-07-22: qty 1

## 2014-07-22 MED ORDER — LIDOCAINE HCL 1 % IJ SOLN
INTRAMUSCULAR | Status: AC
  Filled 2014-07-22: qty 20

## 2014-07-22 MED ORDER — FENTANYL CITRATE (PF) 100 MCG/2ML IJ SOLN
INTRAMUSCULAR | Status: AC | PRN
  Administered 2014-07-22: 50 ug via INTRAVENOUS

## 2014-07-22 MED ORDER — POTASSIUM CHLORIDE 10 MEQ/100ML IV SOLN
10.0000 meq | INTRAVENOUS | Status: AC
  Administered 2014-07-22 (×4): 10 meq via INTRAVENOUS
  Filled 2014-07-22 (×4): qty 100

## 2014-07-22 MED ORDER — MIDAZOLAM HCL 2 MG/2ML IJ SOLN
INTRAMUSCULAR | Status: AC | PRN
  Administered 2014-07-22: 1 mg via INTRAVENOUS

## 2014-07-22 MED ORDER — FENTANYL CITRATE (PF) 100 MCG/2ML IJ SOLN
INTRAMUSCULAR | Status: AC
  Filled 2014-07-22: qty 4

## 2014-07-22 MED ORDER — FUROSEMIDE 10 MG/ML IJ SOLN
20.0000 mg | Freq: Once | INTRAMUSCULAR | Status: AC
  Administered 2014-07-22: 20 mg via INTRAVENOUS
  Filled 2014-07-22: qty 2

## 2014-07-22 MED ORDER — MAGNESIUM SULFATE 2 GM/50ML IV SOLN
2.0000 g | Freq: Once | INTRAVENOUS | Status: AC
  Administered 2014-07-22: 2 g via INTRAVENOUS
  Filled 2014-07-22: qty 50

## 2014-07-22 NOTE — Care Management Note (Signed)
Case Management Note  Patient Details  Name: REGNALD SZCZEPANSKI MRN: [id number] Date of Birth: [birth year]  Subjective/Objective:                    Action/Plan:   Expected Discharge Date:                  Expected Discharge Plan:     In-House Referral:     Discharge planning Services     Post Acute Care Choice:    Choice offered to:     DME Arranged:    DME Agency:     HH Arranged:    HH Agency:     Status of Service:     Medicare Important Message Given:    Date Medicare IM Given:    Medicare IM give by:    Date Additional Medicare IM Given:    Additional Medicare Important Message give by:     If discussed at Long Length of Stay Meetings, dates discussed:  [id number]  Additional Comments:  Golda Acre, RN 07/22/2014, 10:11 AM

## 2014-07-22 NOTE — Progress Notes (Signed)
Date:  July 22, 2014 U.R. performed for needs and level of care. Remains on ciwa protocol bp soft Will continue to follow for Case Management needs.  Marcelle Smiling, RN, BSN, [place]   [phone]

## 2014-07-22 NOTE — Consult Note (Signed)
Reason for consult: Percutaneous gastrostomy tube placement   Referring Physician(s): TRH  History of Present Illness: Phillip May is a 69 y.o. male with history of COPD, GERD, smoker, cervical spinal stenosis with prior decompression in 2006, ORIF of left hip fracture on 07/20/14 who was recently admitted with cough, dyspnea, weakness, deconditioning, failure to thrive, dysphagia, and aspiration pneumonia. Patient claims to have had dysphagia for several years but now it's more pronounced with continued bouts of aspiration. Patient also noted to have prior history of esophageal diverticulum. Request has now been made for percutaneous gastrostomy tube placement to assist with nutrition .  Past Medical History  Diagnosis Date  . Inguinal hernia   . COPD (chronic obstructive pulmonary disease)   . GERD (gastroesophageal reflux disease)   . Chronic hoarseness   . Tobacco abuse   . Cervical spinal stenosis     C4-C5; C5-C6; severe stenosis and abnormal  cord signal C6-C7 s/p decompression in 04/2004    Past Surgical History  Procedure Laterality Date  . Inguinal hernia repair    . Posterior laminectomy / decompression cervical spine  2006  . Intramedullary (im) nail intertrochanteric Left 07/20/2014    Procedure: INTRAMEDULLARY (IM) NAIL INTERTROCHANTRIC LEFT HIP;  Surgeon: [name], MD;  Location: [facility];  Service: Orthopedics;  Laterality: Left;    Allergies: Review of patient's allergies indicates no known allergies.  Medications: Prior to Admission medications   Medication Sig Start Date End Date Taking? Authorizing Provider  AZOR 5-40 MG per tablet Take 1 tablet by mouth Daily. 02/19/11  Yes Historical Provider, MD  budesonide-formoterol (SYMBICORT) 160-4.5 MCG/ACT inhaler Inhale 2 puffs into the lungs 2 (two) times daily.   Yes Historical Provider, MD  guaiFENesin (MUCINEX) 600 MG 12 hr tablet Take 600 mg by mouth daily as needed for cough or to loosen phlegm.   Yes  Historical Provider, MD  ALPRAZolam Prudy Feeler) 1 MG tablet take 1 tab 30 minutes before MRI.  May repeat 1 more tab during MRI if required. Patient not taking: Reported on 07/17/2014 05/21/11   Milas Gain, MD  tiotropium (SPIRIVA HANDIHALER) 18 MCG inhalation capsule Place 1 capsule (18 mcg total) into inhaler and inhale daily. 03/13/11 03/12/12  Kalman Shan, MD     Family History  Problem Relation Age of Onset  . Emphysema Father   . Stomach cancer Sister     History   Social History  . Marital Status: Divorced    Spouse Name: N/A  . Number of Children: N/A  . Years of Education: N/A   Occupational History  . investigator for [employer]    Social History Main Topics  . Smoking status: Current Every Day Smoker -- 0.20 packs/day for 30 years    Types: Cigarettes  . Smokeless tobacco: Never Used     Comment: 3-4 cigs a day  . Alcohol Use: 7.0 oz/week    14 Standard drinks or equivalent per week     Comment: 3-4 shots of liquor a day  . Drug Use: No  . Sexual Activity: Not on file   Other Topics Concern  . None   Social History Narrative      Review of Systems see above  Vital Signs: BP 158/95 mmHg  Pulse 90  Temp(Src) 98.4 F (36.9 C) (Oral)  Resp 24  Ht 5\' 7"  (1.702 m)  Wt 97 lb 10.6 oz (44.3 kg)  BMI 15.29 kg/m2  SpO2 100%  Physical Exam patient awake but  slightly lethargic ; will respond to questions ; chest with diminished breath sounds at bases and few rales on right;  heart with regular rate and rhythm ; abdomen soft, positive bowel sounds , nontender ; extremities with no significant edema   Mallampati Score:     Imaging: Ct Abdomen Wo Contrast  07/21/2014   CLINICAL DATA:  68 year old male with generalized weakness. Evaluate anatomy prior to gastrostomy tube placement. History of dysphagia.  EXAM: CT ABDOMEN WITHOUT CONTRAST  TECHNIQUE: Multidetector CT imaging of the abdomen was performed following the standard protocol without IV contrast.   COMPARISON:  No priors.  FINDINGS: Lower chest: Areas of airspace consolidation in the right middle lobe and right lower lobe. Small right pleural effusion layering dependently. Areas of bronchiectasis and potential cavitation in the posterior aspect of the right lower lobe, poorly evaluated on today's noncontrast CT examination. 1.6 x 1.0 cm nodular density in the medial aspect of the left lower lobe (image 13 of series 5). Trace left pleural effusion. Atherosclerotic calcifications in the left anterior descending and right coronary arteries.  Hepatobiliary: No definite cystic or solid hepatic lesions are identified on today's noncontrast CT examination. Gallbladder is not confidently identified and could be collapsed or surgically absent (no surgical clips are noted in the region of the gallbladder fossa).  Pancreas: Pancreas is poorly depicted, but grossly unremarkable.  Spleen: Unremarkable.  Adrenals/Urinary Tract: The unenhanced appearance of the kidneys and adrenal glands is normal bilaterally. No hydroureteronephrosis.  Stomach/Bowel: The unenhanced appearance of the visualized portions of the stomach is unremarkable. Importantly, however, there are loops of small bowel and colon superficial to the visualized portions of the stomach. No pathologic dilatation of the visualized portions of the small bowel or colon.  Vascular/Lymphatic: Atherosclerosis throughout the visualized abdominal vasculature, without definite aneurysm. No definite lymphadenopathy in the visualized portions of the abdomen on today's noncontrast CT examination.  Other: No significant volume of ascites or definite pneumoperitoneum identified in the abdomen.  Musculoskeletal: Chronic appearing compression fracture of L1 with approximately 20% loss of anterior vertebral body height. There are no aggressive appearing lytic or blastic lesions noted in the visualized portions of the skeleton.  IMPRESSION: 1. Stomach is incompletely visualized  on today's examination. The visualized portions of the stomach are remarkable for superficial loops of small bowel and colon interposed between the stomach in the anterior abdominal wall. 2. Airspace consolidation in the right middle and lower lobes concerning for acute infection. This appears be associated with some areas of bronchiectasis and potential cavitation in the posterior aspect of the right lower lobe. There is also a small right parapneumonic pleural effusion. 3. Pleural-based 1.6 x 1.0 cm nodular density in the medial aspect of the left lower lobe. This could also be infectious or inflammatory in etiology, however, the possibility of neoplasm is not excluded, and close attention on future followup examinations is recommended. There is also trace left pleural effusion. At this time, noncontrast chest CT is recommended in 2-3 weeks to assess for resolution of the right lower/middle lobe pneumonia following trial of antimicrobial therapy, and to reassess this left lower lobe nodular region. 4. Extensive atherosclerosis, including at least 2 vessel coronary artery disease. Please note that although the presence of coronary artery calcium documents the presence of coronary artery disease, the severity of this disease and any potential stenosis cannot be assessed on this non-gated CT examination. Assessment for potential risk factor modification, dietary therapy or pharmacologic therapy may be warranted, if clinically indicated.  5. Additional incidental findings, as above.   Electronically Signed   By: Trudie Reed M.D.   On: 07/21/2014 20:26   Ct Head Wo Contrast  07/19/2014   CLINICAL DATA:  Headache, hallucinating objects  EXAM: CT HEAD WITHOUT CONTRAST  TECHNIQUE: Contiguous axial images were obtained from the base of the skull through the vertex without intravenous contrast.  COMPARISON:  None  FINDINGS: There is no evidence of mass effect, midline shift, or extra-axial fluid collections. There is  no evidence of a space-occupying lesion or intracranial hemorrhage. There is no evidence of a cortical-based area of acute infarction. 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 There is generalized cerebral atrophy. There is periventricular white matter low attenuation likely secondary to microangiopathy.  The ventricles and sulci are appropriate for the patient's age. The basal cisterns are patent.  Visualized portions of the orbits are unremarkable. There is near complete opacification of the left maxillary sinus. There is an air-fluid level in the right sphenoid sinus. There is mild left ethmoid sinus mucosal thickening. Cerebrovascular atherosclerotic calcifications are noted.  The osseous structures are unremarkable.  IMPRESSION: 1. No acute intracranial pathology. 2. Chronic microvascular disease and cerebral atrophy. 3. Right sphenoid and left maxillary sinus disease. Mild left ethmoid sinus disease.   Electronically Signed   By: Elige Ko   On: 07/19/2014 18:01   Pelvis Portable  07/20/2014   CLINICAL DATA:  69 year old status post left hip repair  EXAM: PORTABLE PELVIS 1-2 VIEWS  COMPARISON:  Preoperative radiographs 07/19/2014  FINDINGS: Interval ORIF of intertrochanteric left femoral fracture with intra medullary nail and a transfemoral neck gamma nail. Improved alignment of the fracture fragments. No evidence of acute hardware complication. Expected postoperative subcutaneous emphysema. Oral contrast material opacifies the colon. Scattered atherosclerotic vascular calcifications are noted.  IMPRESSION: ORIF left intertrochanteric femoral fracture without evidence of complication.   Electronically Signed   By: Malachy Moan M.D.   On: 07/20/2014 21:15   Dg Chest Port 1 View  07/22/2014   CLINICAL DATA:  Right lower lobe infiltrate. Increasing shortness of breath and chest congestion.  EXAM: PORTABLE CHEST - 1 VIEW  COMPARISON:  07/17/2014  FINDINGS: Slight improvement in the pneumonia in  the right lower lobe. New accentuation of the interstitial markings at the left lung base. Probable small bilateral pleural effusions superimposed on emphysema. Tortuosity of the thoracic aorta. Overall heart size is normal.  IMPRESSION: Slight improvement in right lower lobe pneumonia. Interstitial accentuation at the left lung base could represent pneumonitis. Is the patient aspirating? COPD. Probable small effusions.   Electronically Signed   By: Francene Boyers M.D.   On: 07/22/2014 07:40   Dg Chest Port 1 View  07/17/2014   CLINICAL DATA:  Pt found on kitchen floor. Believed to be there for approx 4 hrs. Sts he fell but sat himself down, wasn't a "crash." Complains of sob and congestion x3 days. Was too weak to stand up from fall. Has been taking mucinex. Per neighbor, he started coughing up blood clots yesterday.  EXAM: PORTABLE CHEST - 1 VIEW  COMPARISON:  04/27/2004  FINDINGS: There is dense consolidation in knee right lower lung, projecting in the right lower lobe. This is new from the prior study.  Lungs are hyperexpanded consistent with underlying COPD. No other lung consolidation. No pulmonary edema. No pleural effusion or pneumothorax.  Cardiac silhouette is normal in size. Aorta is uncoiled. No mediastinal or hilar masses or convincing adenopathy.  Bony thorax is  demineralized but intact.  IMPRESSION: Right lower lobe consolidation consistent with pneumonia. Underlying COPD.   Electronically Signed   By: Amie Portland M.D.   On: 07/17/2014 11:58   Dg Knee Left Port  07/19/2014   CLINICAL DATA:  Status post fall.  EXAM: PORTABLE LEFT KNEE - 1-2 VIEW  COMPARISON:  None.  FINDINGS: There is no evidence of fracture, dislocation, or joint effusion. There is generalized osteopenia. There is no evidence of arthropathy or other focal bone abnormality. Soft tissues are unremarkable. There is peripheral vascular atherosclerotic disease.  IMPRESSION: No acute osseous injury of the left knee.   Electronically  Signed   By: Elige Ko   On: 07/19/2014 20:25   Dg Swallowing Func-speech Pathology  07/19/2014    Objective Swallowing Evaluation:    Patient Details  Name: Phillip May MRN: [id number] Date of Birth: 1945-03-03  Today's Date: 07/19/2014 Time: SLP Start Time (ACUTE ONLY): 1120-SLP Stop Time (ACUTE ONLY): 1155 SLP Time Calculation (min) (ACUTE ONLY): 35 min  Past Medical History:  Past Medical History  Diagnosis Date  . Inguinal hernia   . COPD (chronic obstructive pulmonary disease)   . GERD (gastroesophageal reflux disease)   . Chronic hoarseness   . Tobacco abuse   . Cervical spinal stenosis     C4-C5; C5-C6; severe stenosis and abnormal  cord signal C6-C7 s/p  decompression in 04/2004   Past Surgical History:  Past Surgical History  Procedure Laterality Date  . Inguinal hernia repair    . Posterior laminectomy / decompression cervical spine  2006   HPI:  Other Pertinent Information: Phillip May is a 69 y.o. male, history of  COPD (not on oxygen), ongoing smoking, occasional alcohol use, history of  dysphagia on a soft diet per MBS in September 2014, chronic hoarse voice,  GERD, generalized weakness and deconditioning.  PSH + for cervical  decompression surgery in 2006.  Pt reported chronic problems with  hoarseness and dysphagia s/p cervical surgery.  He also experienced 30  pound weight loss within 3 years-pt reported in 2014.  He lives at home  alone and receives help from neighbors for getting groceries and  activities of daily living.  The MBS in September 2014 did show trace  aspiration of thins and penetration of nectar liquids.  CXR showed right  lower lobe pna.    No Data Recorded  Assessment / Plan / Recommendation CHL IP CLINICAL IMPRESSIONS 07/19/2014  Therapy Diagnosis Severe pharyngeal phase dysphagia;Severe cervical  esophageal phase dysphagia;Moderate pharyngeal phase dysphagia;Moderate  cervical esophageal phase dysphagia  Clinical Impression Known chronic dysphagia with suspected acute   exacerbation.  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    Weakness in pharyngeal contraction and tongue base retraction/poor  epiglottic deflection results in gross pharyngeal residuals that mix with  secretions without adequate pt sensation.  With liquids, pt conducts  multiple reflexive swallows (x4 - with liquids) with extended breathhold  *presumed to be compensatory to help clear and prevent gross aspiration.   Poor hyolaryngeal elevation allows laryngeal penetration of liquids during  the swallow.  Liquid swallows helpful to reduce solid food residuals in  pharynx.    Mild amount of aspiration (silent) after the swallow noted as barium  spills posterior into open larynx from pyriform sinus.   Pt did not sense  residuals today (mixed with secretions) but cued swallow, liquid follow  solids and cued expectoration (of secretions mixed with barium) effective  to clear 80%.    Per previous MBS in 2014 *Dr Bradly Chris, radiologist stated findings  consistent with esophagram from 08/2012 indicating narrowing of lower  cervical esophagus and dilated cervical esophagus.  SLP today noted area  near cervical hardware where barium pools slightly, clears with reflexive  swallows.   Phillip May again reports desire to eat with known aspiration/malnutrition  risks.  Pt does admit to further weight loss of 5 pounds since 2014 and  states he has "good days and bad days" re: swallowing.  SLP  advised him  to monitor swallowing/nutrition closely to maximize  hydration/nutrition/airway safety.  Pt readily states liquid are easier  for him to consume.    To respect pt wishes, recommend dys3/thin with precautions.  Pt will  require crushed or liquid medications due to level of dysphagia.  Using  live video, educated pt to findings, effective compensations.  Also  recommend pt have suction set up to help with his secretion management.           No flowsheet data found.    CHL IP DIET RECOMMENDATION 07/19/2014  SLP Diet Recommendations Dysphagia 3 (Mech soft);Thin  Liquid Administration via cup  Medication Administration Crushed with puree  Compensations Slow rate;Small sips/bites;Follow solids with  liquid;Multiple dry swallows after each bite/sip, cough and expectorate  Postural Changes and/or Swallow Maneuvers (None)     CHL IP OTHER RECOMMENDATIONS 07/19/2014  Recommended Consults (None)  Oral Care Recommendations Oral care QID  Other Recommendations (None)     CHL IP FOLLOW UP RECOMMENDATIONS 10/28/2012  Follow up Recommendations None     CHL IP FREQUENCY AND DURATION 07/19/2014  Speech Therapy Frequency (ACUTE ONLY) min 2x/week  Treatment Duration 1 week         CHL IP REASON FOR REFERRAL 07/19/2014  Reason for Referral Objectively evaluate swallowing function     CHL IP ORAL PHASE 07/19/2014                    Oral Phase WFL                     CHL IP PHARYNGEAL PHASE 07/19/2014  Pharyngeal Phase Impaired  Pharyngeal Comment multiple swallows across consistencies, following solid  with liquids helpful to decrease vallecular residuals, cough and  expectoration helpful, limited postural changes available due to cervical  decompression sx in 06      Donavan Burnet, MS [facility] SLP [phone]    Dg C-arm 1-60 Min  07/20/2014   CLINICAL DATA:  69 year old male with left intertrochanteric femoral fracture undergoing ORIF  EXAM: DG C-ARM 61-120 MIN; LEFT FEMUR 2 VIEWS  COMPARISON:  Preoperative radiographs 07/19/2014  FINDINGS: For intraoperative spot radiographs demonstrate open reduction internal fixation of intertrochanteric femoral fracture with an intra medullary femoral nail and transfemoral neck gamma nail. No evidence of immediate hardware complication. There is a single distal interlocking screw.  IMPRESSION: ORIF of left intertrochanteric femoral fracture without evidence of immediate complication   Electronically Signed   By: Malachy Moan M.D.   On: 07/20/2014 20:39   Dg Hip Unilat  With Pelvis 2-3 Views Left  07/19/2014   CLINICAL DATA:  Fall today with left hip pain, initial encounter  EXAM: LEFT HIP (WITH PELVIS) 2-3 VIEWS  COMPARISON:  None.  FINDINGS: There is an a minimally displaced intratrochanteric fracture of the proximal left femur. No dislocation is noted. Contrast material is seen from prior speech pathology examination. The pelvic ring is otherwise  intact. No other focal abnormality is seen.  IMPRESSION: Mildly displaced left intratrochanteric femoral fracture.   Electronically Signed   By: Alcide Clever M.D.   On: 07/19/2014 17:34   Dg Femur Min 2 Views Left  07/20/2014   CLINICAL DATA:  Postop left hip fracture repair  EXAM: LEFT FEMUR 2 VIEWS  COMPARISON:  07/19/2014  FINDINGS: Internal fixation across the left intertrochanteric femoral fracture. Near anatomic alignment. No hardware or bony complicating feature.  IMPRESSION: Internal fixation across the left femoral intertrochanteric fracture. No complicating feature.   Electronically Signed   By: Charlett Nose M.D.   On: 07/20/2014 21:20   Dg Femur Min 2 Views Left  07/20/2014   CLINICAL DATA:  69 year old male with left intertrochanteric femoral fracture undergoing ORIF  EXAM: DG C-ARM 61-120 MIN; LEFT FEMUR 2 VIEWS  COMPARISON:  Preoperative radiographs 07/19/2014  FINDINGS: For intraoperative spot radiographs demonstrate open reduction internal fixation of intertrochanteric femoral fracture with an intra medullary femoral nail and transfemoral neck gamma nail. No evidence of immediate hardware complication. There is a single distal interlocking screw.  IMPRESSION: ORIF of left intertrochanteric femoral fracture without evidence of immediate complication   Electronically Signed   By: Malachy Moan M.D.   On: 07/20/2014 20:39    Labs:  CBC:  Recent Labs  07/18/14 0805 07/20/14 0525 07/21/14 0725 07/22/14 0345  WBC 7.9 7.9 7.9 7.8  HGB 12.0* 10.4* 9.0* 7.8*  HCT 35.1* 30.5* 26.8* 23.5*  PLT 227 275 244  196    COAGS:  Recent Labs  07/19/14 1906  INR 1.12    BMP:  Recent Labs  07/18/14 0805 07/19/14 0400 07/20/14 0525 07/21/14 0725 07/22/14 0345  NA 136  --  137 137 138  K 3.4* 3.7 3.8 3.6 3.5  CL 96*  --  101 100* 104  CO2 30  --  24 26 27   GLUCOSE 131*  --  109* 91 89  BUN 24*  --  13 12 12   CALCIUM 8.1*  --  8.3* 8.1* 7.7*  CREATININE 0.88  --  0.95 0.88 0.87  GFRNONAA >60  --  >60 >60 >60  GFRAA >60  --  >60 >60 >60    LIVER FUNCTION TESTS:  Recent Labs  07/17/14 1206  BILITOT 0.8  AST 26  ALT 18  ALKPHOS 76  PROT 7.2  ALBUMIN 3.0*    TUMOR MARKERS: No results for input(s): AFPTM, CEA, CA199, CHROMGRNA in the last 8760 hours.  Assessment and Plan: Phillip May is a 69 y.o. male with history of COPD, GERD, smoker, cervical spinal stenosis with prior decompression in 2006, ORIF of left hip fracture on 07/20/14 who was recently admitted with cough, dyspnea, weakness, deconditioning, failure to thrive, dysphagia, and aspiration pneumonia. Patient claims to have had dysphagia for several years but now it's more pronounced with continued bouts of aspiration. Patient also noted to have prior history of esophageal diverticulum. Request has now been made for percutaneous gastrostomy tube placement to assist with nutrition. Patient currently afebrile.  Labs reviewed, hemoglobin 7.8 today, WBC and platelet count normal.  PT and INR within normal limits  Imaging studies have been reviewed and this reveals incomplete visualization of the patient's stomach with overlying loops of small bowel and colon interposed between the stomach and the anterior abdominal wall. Plan at this time is to insufflate the patient's stomach to see if bowel loops can be moved away and allow  adequate window for gastrostomy tube placement.  If this cannot be accomplished gastrostomy tube cannot be performed percutaneously. However if adequate window is obtained following insufflation plans are to  proceed with gastrostomy tube placement. Risks and benefits discussed with the patient/patient's guardians including, but not limited to the need for a barium enema during the procedure, bleeding, infection, peritonitis, or damage to adjacent structures.All of the patient's questions were answered, patient is agreeable to proceed.Consent signed and in chart. Procedure scheduled for later today.      Signed: D. Jeananne Rama 07/22/2014, 11:49 AM   I spent a total of 40 minutes in face to face in clinical consultation, greater than 50% of which was counseling/coordinating care for percutaneous gastrostomy tube placement

## 2014-07-22 NOTE — Sedation Documentation (Signed)
Due to patient's anatomy, unable to place G tube.  Pt to be transported back to Rm 1224 via Bed with RN.

## 2014-07-22 NOTE — Progress Notes (Signed)
SLP Cancellation Note  Patient Details Name: Phillip May MRN: [id number] DOB: [birth year]   Cancelled treatment:       Reason Eval/Treat Not Completed: Other (comment) (pt potentially to receive PEG today per chart review)   Donavan Burnet, MS [facility] SLP [phone]

## 2014-07-22 NOTE — Progress Notes (Signed)
ANTIBIOTIC CONSULT NOTE - follow up  Pharmacy Consult for Unasyn Indication: aspiration pneumonia  No Known Allergies  Patient Measurements: Height: 5\' 7"  (170.2 cm) Weight: 97 lb 10.6 oz (44.3 kg) IBW/kg (Calculated) : 66.1  Vital Signs: Temp: 98 F (36.7 C) (06/09 1233) Temp Source: Oral (06/09 1233) BP: 152/110 mmHg (06/09 1200) Pulse Rate: 85 (06/09 1200) Intake/Output from previous day: 06/08 0701 - 06/09 0700 In: 2960 [I.V.:1500; IV Piggyback:450] Out: 496 [Urine:495; Stool:1] Intake/Output from this shift: Total I/O In: 350 [IV Piggyback:350] Out: 1100 [Urine:1100]  Labs:  Recent Labs  07/20/14 0525 07/21/14 0725 07/22/14 0345  WBC 7.9 7.9 7.8  HGB 10.4* 9.0* 7.8*  PLT 275 244 196  CREATININE 0.95 0.88 0.87   Estimated Creatinine Clearance: 50.9 mL/min (by C-G formula based on Cr of 0.87). No results for input(s): VANCOTROUGH, VANCOPEAK, VANCORANDOM, GENTTROUGH, GENTPEAK, GENTRANDOM, TOBRATROUGH, TOBRAPEAK, TOBRARND, AMIKACINPEAK, AMIKACINTROU, AMIKACIN in the last 72 hours.   Microbiology: Recent Results (from the past 720 hour(s))  Blood culture (routine x 2)     Status: None (Preliminary result)   Collection Time: 07/17/14  1:28 PM  Result Value Ref Range Status   Specimen Description BLOOD LEFT HAND  Final   Special Requests BOTTLES DRAWN AEROBIC ONLY 2CC  Final   Culture   Final           BLOOD CULTURE RECEIVED NO GROWTH TO DATE CULTURE WILL BE HELD FOR 5 DAYS BEFORE ISSUING A FINAL NEGATIVE REPORT Note: Culture results may be compromised due to an inadequate volume of blood received in culture bottles. Performed at [facility]    Report Status PENDING  Incomplete  Blood culture (routine x 2)     Status: None (Preliminary result)   Collection Time: 07/17/14  1:39 PM  Result Value Ref Range Status   Specimen Description BLOOD RIGHT HAND  Final   Special Requests BOTTLES DRAWN AEROBIC ONLY 2CC  Final   Culture   Final           BLOOD  CULTURE RECEIVED NO GROWTH TO DATE CULTURE WILL BE HELD FOR 5 DAYS BEFORE ISSUING A FINAL NEGATIVE REPORT Performed at [facility]    Report Status PENDING  Incomplete  Urine culture     Status: None   Collection Time: 07/17/14  4:40 PM  Result Value Ref Range Status   Specimen Description URINE, CLEAN CATCH  Final   Special Requests NONE  Final   Colony Count NO GROWTH Performed at [facility]   Final   Culture NO GROWTH Performed at [facility]   Final   Report Status 07/18/2014 FINAL  Final  Culture, respiratory (NON-Expectorated)     Status: None   Collection Time: 07/17/14  5:00 PM  Result Value Ref Range Status   Specimen Description SPUTUM  Final   Special Requests NONE  Final   Gram Stain   Final    NO WBC SEEN RARE SQUAMOUS EPITHELIAL CELLS PRESENT FEW GRAM POSITIVE COCCI IN PAIRS RARE GRAM POSITIVE RODS Performed at [facility]    Culture   Final    NORMAL OROPHARYNGEAL FLORA Performed at [facility]    Report Status 07/20/2014 FINAL  Final    Medical History: Past Medical History  Diagnosis Date  . Inguinal hernia   . COPD (chronic obstructive pulmonary disease)   . GERD (gastroesophageal reflux disease)   . Chronic hoarseness   . Tobacco abuse   . 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 male with PMH COPD presented  07/17/2014 after being found on floor w/o LOC.  Endorses progressive dysphagia x several years; thinks he aspirated on food about 1 wk ago and since developed productive cough with SOB.  Admitting for CAP/aspiration PNA.  Pharmacy consulted to assist with dosing of Unasyn. Patient also on Azithromycin per MD.   6/4 >> Ceftriaxone x 1 6/4 >> Azithromycin (MD) >>   6/4 >> Unasyn >>  Tmax: afebrile WBCs: WNL Renal: SCr improved to WNL SCr, CrCl ~ 50 ml/min CG  6/4 blood x 2: NGTD 6/4 urine:  NGF 6/4 sputum: normal oropharyngeal flora 6/4 S. pneumo UAg: neg 6/4 Legionella UAg: neg   Goal of Therapy:  Eradication of infection Appropriate antibiotic dosing for indication and renal function  Plan: D6 Antibiotics  Continue Unasyn to 1.5g IV q6h  Continue to monitor renal function, cultures, length of therapy, clinical course.   Greer Pickerel, PharmD, BCPS Pager: [phone] 07/22/2014 1:17 PM

## 2014-07-22 NOTE — Progress Notes (Signed)
**Note Phillip-Identified via Obfuscation** Patient Demographics  Phillip May, is a 69 y.o. male, DOB - 1946/01/24, [name]  Admit date - 07/17/2014   Admitting Physician Leroy Sea, MD  Outpatient Primary MD for the patient is Londell Moh, MD  LOS - 5   Chief Complaint  Patient presents with  . Shortness of Breath      Summary   Phillip May is a 69 y.o. male, history of COPD follows with Dr. Marchelle Gearing not on oxygen, ongoing smoking, occasional alcohol use, history of dysphagia on a soft diet per speech therapy eval in 2014, chronic hoarse voice, GERD, generalized weakness and deconditioning. Who lives at home by himself with help from neighbors and getting groceries and activities of daily living.  Patient was brought into the ER after a neighbor found him on the floor of his kitchen, and apparently patient was feeling weak and sat himself on the floor, he did not hit the floor or hurt himself. He states that he has been gradually developing dysphagia over the course of few years, likely aspirated and choked on food about a week ago, since then has had a productive cough with some shortness of breath, no fevers, has been not eating or drinking well since that time and getting gradually weak. Finally slumped to the floor as above and was brought to the ER.  In the ER workups at assistive of acute on chronic respiratory failure due to aspiration/community-acquired pneumonia. He also had signs of dehydration with elevated lactate/early sepsis. I was called to admit the patient.  2 days into the admission Patient got confused, fell, L Hip fracture, ORIF 07-20-14, continues to Aspirate and now wants a PEG understands risks and benefits.   Subjective:   Phillip May today has, No headache, No chest pain, No abdominal pain - No  Nausea, No new weakness tingling or numbness, much improved Cough - SOB. L hip pain,feels better today.  Assessment & Plan    1. L hip fracture due to fall caused by Dilirium - treat as post ORIF on 07/20/2014 by Dr. [name], mild perioperative blood loss related anemia, initiate PT will require placement. Her orthopedics Lovenox for DVT prophylaxis, weightbearing as tolerated.   2. Acute on chronic respiratory failure due to aspiration/committee acquired pneumonia. Much improved after being nothing by mouth and with empiric anti-biotics which include Unasyn and azithromycin, NPO, monitor cultures. Continue supportive care with oxygen and nebulizer treatments. 02 as needed.    3. Chronic dysphagia H/O esophageal diverticulum. Now evidence of aspiration. Speech following - Nothing by mouth except medications, PEG requested by IR he  understands risks and benefits, family as well.   4. Sepsis as in #1 above, resolved after IV fluids with bolus and maintenance. Repeat lactate is normal.    5. History of COPD and smoking. No wheezing, supportive care as in #1 above. Counseled to quit smoking.    6. Generalized weakness and deconditioning. PT eval done and recommended SNF, social work consulted.    7. Dehydration. Clinically resolved after hydration.   8. Chronic anxiety. Home dose and as continue.   9. Hypokalemia. Replaced and stable with magnesium levels.   10. Alcohol withdrawal. Placed on CIWA protocol. Counseled.    Code Status: DNR  Family Communication: None present  Disposition Plan: SNF   Consults Speech, PT   Procedures   L ORIF by Dr. Linna Caprice on 07/20/2014 PEG reuested   DVT Prophylaxis  Lovenox    Lab Results  Component Value Date   PLT 196 07/22/2014    Medications  Scheduled Meds: . ampicillin-sulbactam (UNASYN) IV  1.5 g Intravenous Q6H  . antiseptic oral rinse  7 mL Mouth Rinse q12n4p  . azithromycin  500 mg Intravenous Q24H  .  budesonide-formoterol  2 puff Inhalation BID  . chlorhexidine  15 mL Mouth Rinse BID  . enoxaparin (LOVENOX) injection  30 mg Subcutaneous Q24H  . feeding supplement (ENSURE ENLIVE)  237 mL Oral BID BM  . folic acid  1 mg Oral Daily  . furosemide  20 mg Intravenous Once  . levalbuterol  1.25 mg Nebulization TID  . LORazepam  1 mg Intravenous Q6H  . magnesium sulfate 1 - 4 g bolus IVPB  2 g Intravenous Once  . multivitamin with minerals  1 tablet Oral Daily  . nebivolol  5 mg Oral Daily  . potassium chloride  10 mEq Intravenous Q1 Hr x 4  . thiamine  100 mg Oral Daily  . tiotropium  18 mcg Inhalation Daily   Continuous Infusions: . sodium chloride 75 mL/hr at 07/21/14 2000   PRN Meds:.acetaminophen **OR** [DISCONTINUED] acetaminophen, albuterol, guaiFENesin, haloperidol lactate, hydrALAZINE, HYDROcodone-acetaminophen, LORazepam **OR** LORazepam, metoprolol, morphine injection, [DISCONTINUED] ondansetron **OR** ondansetron (ZOFRAN) IV  Antibiotics     Anti-infectives    Start     Dose/Rate Route Frequency Ordered Stop   07/21/14 0000  ceFAZolin (ANCEF) IVPB 2 g/50 mL premix     2 g 100 mL/hr over 30 Minutes Intravenous Every 6 hours 07/20/14 2139 07/21/14 0646   07/20/14 1000  ceFAZolin (ANCEF) IVPB 2 g/50 mL premix     2 g 100 mL/hr over 30 Minutes Intravenous  Once 07/19/14 2133 07/20/14 1833   07/19/14 1000  ampicillin-sulbactam (UNASYN) 1.5 g in sodium chloride 0.9 % 50 mL IVPB     1.5 g 100 mL/hr over 30 Minutes Intravenous Every 6 hours 07/19/14 0812     07/18/14 1400  azithromycin (ZITHROMAX) 500 mg in dextrose 5 % 250 mL IVPB     500 mg 250 mL/hr over 60 Minutes Intravenous Every 24 hours 07/17/14 1612     07/18/14 0200  ampicillin-sulbactam (UNASYN) 1.5 g in sodium chloride 0.9 % 50 mL IVPB  Status:  Discontinued     1.5 g 100 mL/hr over 30 Minutes Intravenous Every 8 hours 07/17/14 1621 07/19/14 0812   07/18/14 0000  azithromycin (ZITHROMAX) 500 mg in dextrose 5 % 250  mL IVPB  Status:  Discontinued     500 mg 250 mL/hr over 60 Minutes Intravenous Every 24 hours 07/17/14 1344 07/17/14 1612   07/17/14 1500  Ampicillin-Sulbactam (UNASYN) 3 g in sodium chloride 0.9 % 100 mL IVPB     3 g 100 mL/hr over 60 Minutes Intravenous  Once 07/17/14 1432 07/17/14 1900   07/17/14 1230  cefTRIAXone (ROCEPHIN) 1 g in dextrose 5 % 50 mL IVPB  Status:  Discontinued     1 g 100 mL/hr over 30 Minutes Intravenous  Once 07/17/14 1219 07/17/14 1416   07/17/14 1230  azithromycin (ZITHROMAX) 500 mg in dextrose 5 % 250 mL IVPB     500 mg 250 mL/hr over 60 Minutes Intravenous  Once 07/17/14 1219 07/17/14 1550        Objective:  Filed Vitals:   07/22/14 0400 07/22/14 0600 07/22/14 0800 07/22/14 0806  BP: 158/95     Pulse: 88 90    Temp:   98.4 F (36.9 C)   TempSrc:   Oral   Resp: 24     Height:      Weight:      SpO2: 100%   100%    Wt Readings from Last 3 Encounters:  07/17/14 44.3 kg (97 lb 10.6 oz)  05/21/11 57.153 kg (126 lb)  04/23/11 59.421 kg (131 lb)     Intake/Output Summary (Last 24 hours) at 07/22/14 0912 Last data filed at 07/22/14 0700  Gross per 24 hour  Intake   2960 ml  Output    459 ml  Net   2501 ml     Physical Exam  Awake,Oriented X2, No new F.N deficits, Normal affect [place].AT,PERRAL Supple Neck,No JVD, No cervical lymphadenopathy appriciated.  Symmetrical Chest wall movement, Good air movement bilaterally, few R Basilar rales RRR,No Gallops,Rubs or new Murmurs, No Parasternal Heave +ve B.Sounds, Abd Soft, No tenderness, No organomegaly appriciated, No rebound - guarding or rigidity. No Cyanosis, Clubbing or edema, No new Rash or bruise , left hip incision appears stable   Data Review   Micro Results Recent Results (from the past 240 hour(s))  Blood culture (routine x 2)     Status: None (Preliminary result)   Collection Time: 07/17/14  1:28 PM  Result Value Ref Range Status   Specimen Description BLOOD LEFT HAND  Final    Special Requests BOTTLES DRAWN AEROBIC ONLY 2CC  Final   Culture   Final           BLOOD CULTURE RECEIVED NO GROWTH TO DATE CULTURE WILL BE HELD FOR 5 DAYS BEFORE ISSUING A FINAL NEGATIVE REPORT Note: Culture results may be compromised due to an inadequate volume of blood received in culture bottles. Performed at [facility]    Report Status PENDING  Incomplete  Blood culture (routine x 2)     Status: None (Preliminary result)   Collection Time: 07/17/14  1:39 PM  Result Value Ref Range Status   Specimen Description BLOOD RIGHT HAND  Final   Special Requests BOTTLES DRAWN AEROBIC ONLY 2CC  Final   Culture   Final           BLOOD CULTURE RECEIVED NO GROWTH TO DATE CULTURE WILL BE HELD FOR 5 DAYS BEFORE ISSUING A FINAL NEGATIVE REPORT Performed at [facility]    Report Status PENDING  Incomplete  Urine culture     Status: None   Collection Time: 07/17/14  4:40 PM  Result Value Ref Range Status   Specimen Description URINE, CLEAN CATCH  Final   Special Requests NONE  Final   Colony Count NO GROWTH Performed at [facility]   Final   Culture NO GROWTH Performed at [facility]   Final   Report Status 07/18/2014 FINAL  Final  Culture, respiratory (NON-Expectorated)     Status: None   Collection Time: 07/17/14  5:00 PM  Result Value Ref Range Status   Specimen Description SPUTUM  Final   Special Requests NONE  Final   Gram Stain   Final    NO WBC SEEN RARE SQUAMOUS EPITHELIAL CELLS PRESENT FEW GRAM POSITIVE COCCI IN PAIRS RARE GRAM POSITIVE RODS Performed at [facility]    Culture   Final    NORMAL OROPHARYNGEAL FLORA Performed at [facility]  Report Status 07/20/2014 FINAL  Final    Radiology Reports Ct Abdomen Wo Contrast  07/21/2014   CLINICAL DATA:  69 year old male with generalized weakness. Evaluate anatomy prior to gastrostomy tube placement. History of dysphagia.  EXAM: CT ABDOMEN WITHOUT CONTRAST  TECHNIQUE:  Multidetector CT imaging of the abdomen was performed following the standard protocol without IV contrast.  COMPARISON:  No priors.  FINDINGS: Lower chest: Areas of airspace consolidation in the right middle lobe and right lower lobe. Small right pleural effusion layering dependently. Areas of bronchiectasis and potential cavitation in the posterior aspect of the right lower lobe, poorly evaluated on today's noncontrast CT examination. 1.6 x 1.0 cm nodular density in the medial aspect of the left lower lobe (image 13 of series 5). Trace left pleural effusion. Atherosclerotic calcifications in the left anterior descending and right coronary arteries.  Hepatobiliary: No definite cystic or solid hepatic lesions are identified on today's noncontrast CT examination. Gallbladder is not confidently identified and could be collapsed or surgically absent (no surgical clips are noted in the region of the gallbladder fossa).  Pancreas: Pancreas is poorly depicted, but grossly unremarkable.  Spleen: Unremarkable.  Adrenals/Urinary Tract: The unenhanced appearance of the kidneys and adrenal glands is normal bilaterally. No hydroureteronephrosis.  Stomach/Bowel: The unenhanced appearance of the visualized portions of the stomach is unremarkable. Importantly, however, there are loops of small bowel and colon superficial to the visualized portions of the stomach. No pathologic dilatation of the visualized portions of the small bowel or colon.  Vascular/Lymphatic: Atherosclerosis throughout the visualized abdominal vasculature, without definite aneurysm. No definite lymphadenopathy in the visualized portions of the abdomen on today's noncontrast CT examination.  Other: No significant volume of ascites or definite pneumoperitoneum identified in the abdomen.  Musculoskeletal: Chronic appearing compression fracture of L1 with approximately 20% loss of anterior vertebral body height. There are no aggressive appearing lytic or blastic  lesions noted in the visualized portions of the skeleton.  IMPRESSION: 1. Stomach is incompletely visualized on today's examination. The visualized portions of the stomach are remarkable for superficial loops of small bowel and colon interposed between the stomach in the anterior abdominal wall. 2. Airspace consolidation in the right middle and lower lobes concerning for acute infection. This appears be associated with some areas of bronchiectasis and potential cavitation in the posterior aspect of the right lower lobe. There is also a small right parapneumonic pleural effusion. 3. Pleural-based 1.6 x 1.0 cm nodular density in the medial aspect of the left lower lobe. This could also be infectious or inflammatory in etiology, however, the possibility of neoplasm is not excluded, and close attention on future followup examinations is recommended. There is also trace left pleural effusion. At this time, noncontrast chest CT is recommended in 2-3 weeks to assess for resolution of the right lower/middle lobe pneumonia following trial of antimicrobial therapy, and to reassess this left lower lobe nodular region. 4. Extensive atherosclerosis, including at least 2 vessel coronary artery disease. Please note that although the presence of coronary artery calcium documents the presence of coronary artery disease, the severity of this disease and any potential stenosis cannot be assessed on this non-gated CT examination. Assessment for potential risk factor modification, dietary therapy or pharmacologic therapy may be warranted, if clinically indicated. 5. Additional incidental findings, as above.   Electronically Signed   By: Trudie Reed M.D.   On: 07/21/2014 20:26   Ct Head Wo Contrast  07/19/2014   CLINICAL DATA:  Headache, hallucinating objects  EXAM: CT HEAD WITHOUT CONTRAST  TECHNIQUE: Contiguous axial images were obtained from the base of the skull through the vertex without intravenous contrast.  COMPARISON:   None  FINDINGS: There is no evidence of mass effect, midline shift, or extra-axial fluid collections. There is no evidence of a space-occupying lesion or intracranial hemorrhage. There is no evidence of a cortical-based area of acute infarction. 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 There is generalized cerebral atrophy. There is periventricular white matter low attenuation likely secondary to microangiopathy.  The ventricles and sulci are appropriate for the patient's age. The basal cisterns are patent.  Visualized portions of the orbits are unremarkable. There is near complete opacification of the left maxillary sinus. There is an air-fluid level in the right sphenoid sinus. There is mild left ethmoid sinus mucosal thickening. Cerebrovascular atherosclerotic calcifications are noted.  The osseous structures are unremarkable.  IMPRESSION: 1. No acute intracranial pathology. 2. Chronic microvascular disease and cerebral atrophy. 3. Right sphenoid and left maxillary sinus disease. Mild left ethmoid sinus disease.   Electronically Signed   By: Elige Ko   On: 07/19/2014 18:01   Pelvis Portable  07/20/2014   CLINICAL DATA:  69 year old status post left hip repair  EXAM: PORTABLE PELVIS 1-2 VIEWS  COMPARISON:  Preoperative radiographs 07/19/2014  FINDINGS: Interval ORIF of intertrochanteric left femoral fracture with intra medullary nail and a transfemoral neck gamma nail. Improved alignment of the fracture fragments. No evidence of acute hardware complication. Expected postoperative subcutaneous emphysema. Oral contrast material opacifies the colon. Scattered atherosclerotic vascular calcifications are noted.  IMPRESSION: ORIF left intertrochanteric femoral fracture without evidence of complication.   Electronically Signed   By: Malachy Moan M.D.   On: 07/20/2014 21:15   Dg Chest Port 1 View  07/22/2014   CLINICAL DATA:  Right lower lobe infiltrate. Increasing shortness of breath and chest  congestion.  EXAM: PORTABLE CHEST - 1 VIEW  COMPARISON:  07/17/2014  FINDINGS: Slight improvement in the pneumonia in the right lower lobe. New accentuation of the interstitial markings at the left lung base. Probable small bilateral pleural effusions superimposed on emphysema. Tortuosity of the thoracic aorta. Overall heart size is normal.  IMPRESSION: Slight improvement in right lower lobe pneumonia. Interstitial accentuation at the left lung base could represent pneumonitis. Is the patient aspirating? COPD. Probable small effusions.   Electronically Signed   By: Francene Boyers M.D.   On: 07/22/2014 07:40   Dg Chest Port 1 View  07/17/2014   CLINICAL DATA:  Pt found on kitchen floor. Believed to be there for approx 4 hrs. Sts he fell but sat himself down, wasn't a "crash." Complains of sob and congestion x3 days. Was too weak to stand up from fall. Has been taking mucinex. Per neighbor, he started coughing up blood clots yesterday.  EXAM: PORTABLE CHEST - 1 VIEW  COMPARISON:  04/27/2004  FINDINGS: There is dense consolidation in knee right lower lung, projecting in the right lower lobe. This is new from the prior study.  Lungs are hyperexpanded consistent with underlying COPD. No other lung consolidation. No pulmonary edema. No pleural effusion or pneumothorax.  Cardiac silhouette is normal in size. Aorta is uncoiled. No mediastinal or hilar masses or convincing adenopathy.  Bony thorax is demineralized but intact.  IMPRESSION: Right lower lobe consolidation consistent with pneumonia. Underlying COPD.   Electronically Signed   By: Amie Portland M.D.   On: 07/17/2014 11:58   Dg Knee Left Port  07/19/2014  CLINICAL DATA:  Status post fall.  EXAM: PORTABLE LEFT KNEE - 1-2 VIEW  COMPARISON:  None.  FINDINGS: There is no evidence of fracture, dislocation, or joint effusion. There is generalized osteopenia. There is no evidence of arthropathy or other focal bone abnormality. Soft tissues are unremarkable. There is  peripheral vascular atherosclerotic disease.  IMPRESSION: No acute osseous injury of the left knee.   Electronically Signed   By: Elige Ko   On: 07/19/2014 20:25   Dg Swallowing Func-speech Pathology  07/19/2014    Objective Swallowing Evaluation:    Patient Details  Name: ZEDDIE NJIE MRN: [id number] Date of Birth: Apr 16, 1945  Today's Date: 07/19/2014 Time: SLP Start Time (ACUTE ONLY): 1120-SLP Stop Time (ACUTE ONLY): 1155 SLP Time Calculation (min) (ACUTE ONLY): 35 min  Past Medical History:  Past Medical History  Diagnosis Date  . Inguinal hernia   . COPD (chronic obstructive pulmonary disease)   . GERD (gastroesophageal reflux disease)   . Chronic hoarseness   . Tobacco abuse   . Cervical spinal stenosis     C4-C5; C5-C6; severe stenosis and abnormal  cord signal C6-C7 s/p  decompression in 04/2004   Past Surgical History:  Past Surgical History  Procedure Laterality Date  . Inguinal hernia repair    . Posterior laminectomy / decompression cervical spine  2006   HPI:  Other Pertinent Information: Phillip May is a 69 y.o. male, history of  COPD (not on oxygen), ongoing smoking, occasional alcohol use, history of  dysphagia on a soft diet per MBS in September 2014, chronic hoarse voice,  GERD, generalized weakness and deconditioning.  PSH + for cervical  decompression surgery in 2006.  Pt reported chronic problems with  hoarseness and dysphagia s/p cervical surgery.  He also experienced 30  pound weight loss within 3 years-pt reported in 2014.  He lives at home  alone and receives help from neighbors for getting groceries and  activities of daily living.  The MBS in September 2014 did show trace  aspiration of thins and penetration of nectar liquids.  CXR showed right  lower lobe pna.    No Data Recorded  Assessment / Plan / Recommendation CHL IP CLINICAL IMPRESSIONS 07/19/2014  Therapy Diagnosis Severe pharyngeal phase dysphagia;Severe cervical  esophageal phase dysphagia;Moderate pharyngeal phase  dysphagia;Moderate  cervical esophageal phase dysphagia  Clinical Impression Known chronic dysphagia with suspected acute  exacerbation.  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    Weakness in pharyngeal contraction and tongue base retraction/poor  epiglottic deflection results in gross pharyngeal residuals that mix with  secretions without adequate pt sensation.  With liquids, pt conducts  multiple reflexive swallows (x4 - with liquids) with extended breathhold  *presumed to be compensatory to help clear and prevent gross aspiration.   Poor hyolaryngeal elevation allows laryngeal penetration of liquids during  the swallow.  Liquid swallows helpful to reduce solid food residuals in  pharynx.    Mild amount of aspiration (silent) after the swallow noted as barium  spills posterior into open larynx from pyriform sinus.   Pt did not sense  residuals today (mixed with secretions) but cued swallow, liquid follow  solids and cued expectoration (of secretions mixed with barium) effective  to clear 80%.    Per previous MBS in 2014 *Dr Bradly Chris, radiologist stated findings  consistent with esophagram from 08/2012 indicating narrowing of lower  cervical esophagus and dilated cervical esophagus.  SLP today noted area  near cervical hardware where barium pools slightly, clears with reflexive  swallows.   Phillip May again reports desire to eat with known aspiration/malnutrition  risks.  Pt does admit to further weight loss of 5 pounds since 2014 and  states he has "good days and bad days" re: swallowing.  SLP  advised him  to monitor swallowing/nutrition closely to maximize  hydration/nutrition/airway safety.  Pt readily states liquid are easier  for him to consume.    To respect pt wishes, recommend dys3/thin with precautions.  Pt will  require crushed or liquid medications due to level of dysphagia.  Using  live video, educated pt to findings, effective  compensations.  Also  recommend pt have suction set up to help with his secretion management.           No flowsheet data found.   CHL IP DIET RECOMMENDATION 07/19/2014  SLP Diet Recommendations Dysphagia 3 (Mech soft);Thin  Liquid Administration via cup  Medication Administration Crushed with puree  Compensations Slow rate;Small sips/bites;Follow solids with  liquid;Multiple dry swallows after each bite/sip, cough and expectorate  Postural Changes and/or Swallow Maneuvers (None)     CHL IP OTHER RECOMMENDATIONS 07/19/2014  Recommended Consults (None)  Oral Care Recommendations Oral care QID  Other Recommendations (None)     CHL IP FOLLOW UP RECOMMENDATIONS 10/28/2012  Follow up Recommendations None     CHL IP FREQUENCY AND DURATION 07/19/2014  Speech Therapy Frequency (ACUTE ONLY) min 2x/week  Treatment Duration 1 week         CHL IP REASON FOR REFERRAL 07/19/2014  Reason for Referral Objectively evaluate swallowing function     CHL IP ORAL PHASE 07/19/2014                    Oral Phase WFL                     CHL IP PHARYNGEAL PHASE 07/19/2014  Pharyngeal Phase Impaired  Pharyngeal Comment multiple swallows across consistencies, following solid  with liquids helpful to decrease vallecular residuals, cough and  expectoration helpful, limited postural changes available due to cervical  decompression sx in 06      Donavan Burnet, MS [facility] SLP [phone]    Dg C-arm 1-60 Min  07/20/2014   CLINICAL DATA:  69 year old male with left intertrochanteric femoral fracture undergoing ORIF  EXAM: DG C-ARM 61-120 MIN; LEFT FEMUR 2 VIEWS  COMPARISON:  Preoperative radiographs 07/19/2014  FINDINGS: For intraoperative spot radiographs demonstrate open reduction internal fixation of intertrochanteric femoral fracture with an intra medullary femoral nail and transfemoral neck gamma nail. No evidence of immediate hardware complication. There is a single distal interlocking screw.  IMPRESSION: ORIF of left intertrochanteric femoral fracture without  evidence of immediate complication   Electronically Signed   By: Malachy Moan M.D.   On: 07/20/2014 20:39   Dg Hip Unilat With Pelvis 2-3 Views Left  07/19/2014   CLINICAL DATA:  Fall today with left hip pain, initial encounter  EXAM: LEFT HIP (WITH PELVIS) 2-3 VIEWS  COMPARISON:  None.  FINDINGS: There is an a minimally displaced intratrochanteric fracture of the proximal left femur. No dislocation is noted. Contrast material is seen from prior speech pathology examination. The pelvic ring is otherwise intact. No other focal abnormality is seen.  IMPRESSION: Mildly displaced left intratrochanteric femoral fracture.   Electronically Signed   By: Alcide Clever M.D.   On: 07/19/2014 17:34   Dg Femur Min 2 Views Left  07/20/2014   CLINICAL DATA:  Postop left hip fracture repair  EXAM: LEFT FEMUR 2 VIEWS  COMPARISON:  07/19/2014  FINDINGS: Internal fixation across the left intertrochanteric femoral fracture. Near anatomic alignment. No hardware or bony complicating feature.  IMPRESSION: Internal fixation across the left femoral intertrochanteric fracture. No complicating feature.   Electronically Signed   By: Charlett Nose M.D.   On: 07/20/2014 21:20   Dg Femur Min 2 Views Left  07/20/2014   CLINICAL DATA:  69 year old male with left intertrochanteric femoral fracture undergoing ORIF  EXAM: DG C-ARM 61-120 MIN; LEFT FEMUR 2 VIEWS  COMPARISON:  Preoperative radiographs 07/19/2014  FINDINGS: For intraoperative spot radiographs demonstrate open reduction internal fixation of intertrochanteric femoral fracture with an intra medullary femoral nail and transfemoral neck gamma nail. No evidence of immediate hardware complication. There is a single distal interlocking screw.  IMPRESSION: ORIF of left intertrochanteric femoral fracture without evidence of immediate complication   Electronically Signed   By: Malachy Moan M.D.   On: 07/20/2014 20:39     CBC  Recent Labs Lab 07/17/14 1206 07/18/14 0805  07/20/14 0525 07/21/14 0725 07/22/14 0345  WBC 11.1* 7.9 7.9 7.9 7.8  HGB 13.1 12.0* 10.4* 9.0* 7.8*  HCT 37.6* 35.1* 30.5* 26.8* 23.5*  PLT 251 227 275 244 196  MCV 97.9 98.9 99.0 99.6 99.2  MCH 34.1* 33.8 33.8 33.5 32.9  MCHC 34.8 34.2 34.1 33.6 33.2  RDW 12.9 13.0 13.0 13.0 13.0    Chemistries   Recent Labs Lab 07/17/14 1206 07/18/14 0805 07/19/14 0400 07/20/14 0525 07/21/14 0725 07/22/14 0345  NA 134* 136  --  137 137 138  K 3.7 3.4* 3.7 3.8 3.6 3.5  CL 94* 96*  --  101 100* 104  CO2 31 30  --  24 26 27   GLUCOSE 135* 131*  --  109* 91 89  BUN 44* 24*  --  13 12 12   CREATININE 1.34* 0.88  --  0.95 0.88 0.87  CALCIUM 8.5* 8.1*  --  8.3* 8.1* 7.7*  MG  --   --  1.0*  --  1.3* 1.6*  AST 26  --   --   --   --   --   ALT 18  --   --   --   --   --   ALKPHOS 76  --   --   --   --   --   BILITOT 0.8  --   --   --   --   --    ------------------------------------------------------------------------------------------------------------------ estimated creatinine clearance is 50.9 mL/min (by C-G formula based on Cr of 0.87). ------------------------------------------------------------------------------------------------------------------ No results for input(s): HGBA1C in the last 72 hours. ------------------------------------------------------------------------------------------------------------------ No results for input(s): CHOL, HDL, LDLCALC, TRIG, CHOLHDL, LDLDIRECT in the last 72 hours. ------------------------------------------------------------------------------------------------------------------ No results for input(s): TSH, T4TOTAL, T3FREE, THYROIDAB in the last 72 hours.  Invalid input(s): FREET3 ------------------------------------------------------------------------------------------------------------------ No results for input(s): VITAMINB12, FOLATE, FERRITIN, TIBC, IRON, RETICCTPCT in the last 72 hours.  Coagulation profile  Recent Labs Lab  07/19/14 1906  INR 1.12    No results for input(s): DDIMER in the last 72 hours.  Cardiac Enzymes  Recent Labs Lab 07/17/14 1206  TROPONINI <0.03   ------------------------------------------------------------------------------------------------------------------ Invalid input(s): POCBNP   Time Spent in minutes   35   Roshonda Sperl K M.D on 07/22/2014 at 9:12 AM  Between 7am to 7pm - Pager - [phone]  After 7pm go to [email or web address] - password [facility]  [facility]  Office  [phone]

## 2014-07-22 NOTE — Procedures (Signed)
Gastrostomy tube, ATTEMPTED. No safe percutaneous access due to overlying bowel despite good gastric distension. Recommend consult gen surg for  placement if needed.

## 2014-07-22 NOTE — Progress Notes (Signed)
Nutrition Follow-up  DOCUMENTATION CODES:  Severe malnutrition in context of chronic illness, Underweight  INTERVENTION: Recommend initiation of Osmolite 1.2 @ 20 ml/hr via peg and increase by 10 ml every 8 hours to goal rate of 50 ml/hr.   Tube feeding regimen provides 1440 kcal (100% of needs), 67 grams of protein, and 984 ml of H2O.   Monitor magnesium, potassium, and phosphorus daily for at least 3 days, MD to replete as needed, as pt is at risk for refeeding syndrome given pt meets criteria for severe malnutrition.  RD will continue to monitor  NUTRITION DIAGNOSIS:  Malnutrition related to chronic illness as evidenced by severe depletion of body fat, severe depletion of muscle mass.  ongoing  GOAL:  Patient will meet greater than or equal to 90% of their needs  Not met  MONITOR:  PO intake, Supplement acceptance, Labs, Weight trends, Skin, I & O's  REASON FOR ASSESSMENT:  Malnutrition Screening Tool    ASSESSMENT: 69 y.o. male, history of COPD follows with Dr. Chase Caller not on oxygen, ongoing smoking, occasional alcohol use, history of dysphagia on a soft diet per speech therapy eval in 2014, chronic hoarse voice, GERD, generalized weakness and deconditioning.   Pt admitted with aspiration/CAP.  Pt remains on CIWA protocol. Left hip fx due to fall.  SLP following- NPO except meds. Pt now wishes for peg. To be placed by IR.  Pt is at risk for refeeding given severe malnutrition and poor po.   RD will continue to monitor for peg placement and initiation of TF. Labs and medications reviewed.  Height:  Ht Readings from Last 1 Encounters:  07/17/14 _0  (1.702 m)    Weight:  Wt Readings from Last 1 Encounters:  07/17/14 97 lb 10.6 oz (44.3 kg)    Ideal Body Weight:  67.2 kg  Wt Readings from Last 10 Encounters:  07/17/14 97 lb 10.6 oz (44.3 kg)  05/21/11 126 lb (57.153 kg)  04/23/11 131 lb (59.421 kg)  03/13/11 128 lb 9.6 oz (58.333 kg)    BMI:   Body mass index is 15.29 kg/(m^2).  Estimated Nutritional Needs:  Kcal:  1300-1500  Protein:  65-75g  Fluid:  1.5L/day  Skin:  Reviewed, no issues  Diet Order:  Diet NPO time specified  EDUCATION NEEDS:  No education needs identified at this time   Intake/Output Summary (Last 24 hours) at 07/22/14 1133 Last data filed at 07/22/14 0700  Gross per 24 hour  Intake   2960 ml  Output    459 ml  Net   2501 ml    Last BM:  6/5  Laurette Schimke [place], [place], [place]

## 2014-07-22 NOTE — Progress Notes (Signed)
   Subjective:  Patient reports pain as mild.  Going for PEG today.  Objective:   VITALS:   Filed Vitals:   07/22/14 0800 07/22/14 0806 07/22/14 1200 07/22/14 1233  BP: 175/95  152/110   Pulse: 85  85   Temp: 98.4 F (36.9 C)   98 F (36.7 C)  TempSrc: Oral   Oral  Resp: 22  20   Height:      Weight:      SpO2: 100% 100% 100%     ABD soft Sensation intact distally Dorsiflexion/Plantar flexion intact Incision: dressing C/D/I Compartment soft foot warm, pulse dopplerable (unchanged)   Lab Results  Component Value Date   WBC 7.8 07/22/2014   HGB 7.8* 07/22/2014   HCT 23.5* 07/22/2014   MCV 99.2 07/22/2014   PLT 196 07/22/2014   BMET    Component Value Date/Time   NA 138 07/22/2014 0345   K 3.5 07/22/2014 0345   CL 104 07/22/2014 0345   CO2 27 07/22/2014 0345   GLUCOSE 89 07/22/2014 0345   BUN 12 07/22/2014 0345   CREATININE 0.87 07/22/2014 0345   CALCIUM 7.7* 07/22/2014 0345   GFRNONAA >60 07/22/2014 0345   GFRAA >60 07/22/2014 0345     Assessment/Plan: 2 Days Post-Op   Principal Problem:   CAP (community acquired pneumonia) Active Problems:   Tobacco abuse   COPD, very severe   Dysphagia   Closed comminuted intertrochanteric fracture of proximal femur   Protein-calorie malnutrition   Dysphagia, unspecified(787.20)   Unspecified protein-calorie malnutrition   WBAT with walker Lovenox x30 days PT/OT D/C planning   Phillip May, Phillip May 07/22/2014, 12:56 PM   Phillip Frederic, MD Cell [phone]

## 2014-07-23 ENCOUNTER — Encounter (HOSPITAL_COMMUNITY): Payer: Self-pay | Admitting: Surgery

## 2014-07-23 LAB — PREPARE RBC (CROSSMATCH)

## 2014-07-23 LAB — VITAMIN B12: VITAMIN B 12: 665 pg/mL (ref 180–914)

## 2014-07-23 LAB — CBC
HEMATOCRIT: 21.8 % — AB (ref 39.0–52.0)
HEMOGLOBIN: 7.3 g/dL — AB (ref 13.0–17.0)
MCH: 33.6 pg (ref 26.0–34.0)
MCHC: 33.5 g/dL (ref 30.0–36.0)
MCV: 100.5 fL — ABNORMAL HIGH (ref 78.0–100.0)
Platelets: 241 10*3/uL (ref 150–400)
RBC: 2.17 MIL/uL — ABNORMAL LOW (ref 4.22–5.81)
RDW: 12.7 % (ref 11.5–15.5)
WBC: 6.1 10*3/uL (ref 4.0–10.5)

## 2014-07-23 LAB — RETICULOCYTES
RBC.: 2.43 MIL/uL — AB (ref 4.22–5.81)
RETIC COUNT ABSOLUTE: 19.4 10*3/uL (ref 19.0–186.0)
Retic Ct Pct: 0.8 % (ref 0.4–3.1)

## 2014-07-23 LAB — CULTURE, BLOOD (ROUTINE X 2)
CULTURE: NO GROWTH
CULTURE: NO GROWTH

## 2014-07-23 LAB — IRON AND TIBC
Iron: 27 ug/dL — ABNORMAL LOW (ref 45–182)
SATURATION RATIOS: 18 % (ref 17.9–39.5)
TIBC: 151 ug/dL — ABNORMAL LOW (ref 250–450)
UIBC: 124 ug/dL

## 2014-07-23 LAB — GLUCOSE, CAPILLARY: GLUCOSE-CAPILLARY: 75 mg/dL (ref 65–99)

## 2014-07-23 LAB — FERRITIN: FERRITIN: 313 ng/mL (ref 24–336)

## 2014-07-23 LAB — HEMOGLOBIN AND HEMATOCRIT, BLOOD
HEMATOCRIT: 37.8 % — AB (ref 39.0–52.0)
HEMOGLOBIN: 12.9 g/dL — AB (ref 13.0–17.0)

## 2014-07-23 LAB — FOLATE: Folate: 9.6 ng/mL (ref >5.9)

## 2014-07-23 MED ORDER — DIPHENHYDRAMINE HCL 50 MG/ML IJ SOLN
25.0000 mg | Freq: Four times a day (QID) | INTRAMUSCULAR | Status: DC | PRN
Start: 2014-07-23 — End: 2014-07-30
  Administered 2014-07-25: 25 mg via INTRAVENOUS
  Filled 2014-07-23: qty 1

## 2014-07-23 MED ORDER — DEXTROSE-NACL 5-0.45 % IV SOLN
INTRAVENOUS | Status: DC
  Administered 2014-07-23 – 2014-07-24 (×2): via INTRAVENOUS

## 2014-07-23 MED ORDER — VITAMINS A & D EX OINT
TOPICAL_OINTMENT | CUTANEOUS | Status: AC
  Administered 2014-07-23: 5
  Filled 2014-07-23: qty 5

## 2014-07-23 MED ORDER — LORAZEPAM 1 MG PO TABS
1.0000 mg | ORAL_TABLET | Freq: Four times a day (QID) | ORAL | Status: DC | PRN
Start: 2014-07-23 — End: 2014-07-26
  Administered 2014-07-23: 1 mg via ORAL
  Filled 2014-07-23: qty 1

## 2014-07-23 MED ORDER — SODIUM CHLORIDE 0.9 % IV SOLN
Freq: Once | INTRAVENOUS | Status: AC
  Administered 2014-07-23: 11:00:00 via INTRAVENOUS

## 2014-07-23 MED ORDER — LORAZEPAM 2 MG/ML IJ SOLN
2.0000 mg | INTRAMUSCULAR | Status: DC | PRN
  Administered 2014-07-25: 2 mg via INTRAVENOUS
  Filled 2014-07-23: qty 1

## 2014-07-23 MED ORDER — SODIUM CHLORIDE 0.9 % IV SOLN
Freq: Once | INTRAVENOUS | Status: AC
  Administered 2014-07-23: 13:00:00 via INTRAVENOUS

## 2014-07-23 MED ORDER — FUROSEMIDE 10 MG/ML IJ SOLN
20.0000 mg | Freq: Once | INTRAMUSCULAR | Status: AC
  Administered 2014-07-23: 20 mg via INTRAVENOUS
  Filled 2014-07-23: qty 2

## 2014-07-23 NOTE — Progress Notes (Signed)
Nutrition Follow-up  DOCUMENTATION CODES:  Severe malnutrition in context of chronic illness, Underweight  INTERVENTION: Recommend initiation of Osmolite 1.2 @ 20 ml/hr via g-tube/peg and increase by 10 ml every 8 hours to goal rate of 50 ml/hr.   Tube feeding regimen provides 1440 kcal (100% of needs), 67 grams of protein, and 984 ml of H2O.   Monitor magnesium, potassium, and phosphorus daily for at least 3 days, MD to replete as needed, as pt is at risk for refeeding syndrome given pt meets criteria for severe malnutrition.  RD will continue to monitor  NUTRITION DIAGNOSIS:  Malnutrition related to chronic illness as evidenced by severe depletion of body fat, severe depletion of muscle mass.  ongoing  GOAL:  Patient will meet greater than or equal to 90% of their needs  Not met  MONITOR:  PO intake, Supplement acceptance, Labs, Weight trends, Skin, I & O's  REASON FOR ASSESSMENT:  Malnutrition Screening Tool   ASSESSMENT:  69 y.o. male, history of COPD follows with Dr. Chase Caller not on oxygen, ongoing smoking, occasional alcohol use, history of dysphagia on a soft diet per speech therapy eval in 2014, chronic hoarse voice, GERD, generalized weakness and deconditioning.   Pt admitted with aspiration/CAP.  Pt remains on CIWA protocol. Left hip fx due to fall.  SLP following- NPO except meds. Pt now wishes for peg. Unable to be placed by IR. Surgery consulted- pt is candidate.  Pt is at risk for refeeding given severe malnutrition and poor po.  RD will continue to monitor for peg/g-tube placement and initiation of TF. Labs and medications reviewed.  Height:  Ht Readings from Last 1 Encounters:  07/17/14 _0  (1.702 m)    Weight:  Wt Readings from Last 1 Encounters:  07/17/14 97 lb 10.6 oz (44.3 kg)    Ideal Body Weight:  67.2 kg  Wt Readings from Last 10 Encounters:  07/17/14 97 lb 10.6 oz (44.3 kg)  05/21/11 126 lb (57.153 kg)  04/23/11 131 lb  (59.421 kg)  03/13/11 128 lb 9.6 oz (58.333 kg)    BMI:  Body mass index is 15.29 kg/(m^2).- pt is underweight  Estimated Nutritional Needs:  Kcal:  1300-1500  Protein:  65-75g  Fluid:  1.5L/day  Skin:  Wound (see comment) (closed incision on left thigh)  Diet Order:  Diet NPO time specified Except for: Sips with Meds  EDUCATION NEEDS:  No education needs identified at this time   Intake/Output Summary (Last 24 hours) at 07/23/14 1136 Last data filed at 07/23/14 1104  Gross per 24 hour  Intake   1060 ml  Output   2250 ml  Net  -1190 ml    Last BM:  6/5  Laurette Schimke [place], [place], [place]

## 2014-07-23 NOTE — Progress Notes (Signed)
SLP Cancellation Note  Patient Details Name: Phillip May MRN: [id number] DOB: January 31, 1946   Cancelled treatment:       Reason Eval/Treat Not Completed: Other (comment);Medical issues which prohibited therapy (pt for PEG in IR today due to anatomy per review of chart)   Donavan Burnet, MS [facility] [phone]

## 2014-07-23 NOTE — Progress Notes (Signed)
Per Dr. Thedore Mins, pt is to be strictly NPO except for crushed meds with applesauce.

## 2014-07-23 NOTE — Progress Notes (Signed)
**Note Phillip-Identified via Obfuscation** Patient Demographics  Phillip May, is a 69 y.o. male, DOB - 03/21/1945, [name]  Admit date - 07/17/2014   Admitting Physician Leroy Sea, MD  Outpatient Primary MD for the patient is Londell Moh, MD  LOS - 6   Chief Complaint  Patient presents with  . Shortness of Breath      Summary   Phillip May is a 69 y.o. male, history of COPD follows with Dr. Marchelle Gearing not on oxygen, ongoing smoking, occasional alcohol use, history of dysphagia on a soft diet per speech therapy eval in 2014, chronic hoarse voice, GERD, generalized weakness and deconditioning. Who lives at home by himself with help from neighbors and getting groceries and activities of daily living.  Patient was brought into the ER after a neighbor found him on the floor of his kitchen, and apparently patient was feeling weak and sat himself on the floor, he did not hit the floor or hurt himself. He states that he has been gradually developing dysphagia over the course of few years, likely aspirated and choked on food about a week ago, since then has had a productive cough with some shortness of breath, no fevers, has been not eating or drinking well since that time and getting gradually weak. Finally slumped to the floor as above and was brought to the ER.  In the ER workups at assistive of acute on chronic respiratory failure due to aspiration/community-acquired pneumonia. He also had signs of dehydration with elevated lactate/early sepsis. I was called to admit the patient.  2 days into the admission Patient got confused, fell, L Hip fracture, ORIF 07-20-14, continues to Aspirate and now wants a PEG understands risks and benefits.   Subjective:   Phillip May today has, No headache, No chest pain, No abdominal pain - No  Nausea, No new weakness tingling or numbness, much improved Cough - SOB. L hip pain,feels better today.  Assessment & Plan    1. L hip fracture due to fall caused by Dilirium - treat as post ORIF on 07/20/2014 by Dr. [name], mild perioperative blood loss related anemia, initiate PT will require placement. Per orthopedics Lovenox for DVT prophylaxis, weightbearing as tolerated per Ortho.   2. Acute on chronic respiratory failure due to aspiration/committee acquired pneumonia. Much improved after being nothing by mouth and with empiric anti-biotics which include Unasyn and azithromycin. Will stop Azithromycin on 07/23/2014 and leave him on Unasyn for now, NPO, monitor cultures. Continue supportive care with oxygen and nebulizer treatments. 02 as needed.    3. Chronic dysphagia H/O esophageal diverticulum. Now evidence of aspiration. Speech following - Nothing by mouth except medications, PEG requested by IR , IR failed on 11/02/2014 due to overlying small bowel, will consult general surgery on 07/23/2014. Patient understands the risks and benefits, family as well.   4. Sepsis as in #1 above, resolved after IV fluids with bolus and maintenance. Repeat lactate is normal.    5. History of COPD and smoking. No wheezing, supportive care as in #1 above. Counseled to quit smoking.    6. Generalized weakness and deconditioning. PT eval done and recommended SNF, social work consulted.    7. Dehydration. Clinically resolved after hydration.   8. Chronic anxiety. Home dose  and as continue.   9. Hypokalemia. Replaced and stable with magnesium levels.   10. Alcohol withdrawal. Placed on CIWA protocol. Counseled.   11. Anemia worse after surgery due to blood loss due to perioperative period. Check anemia panel, would transfuse one unit on 07/23/2014 due to generalized weakness deconditioning.    Code Status: DNR  Family Communication: None present  Disposition Plan: SNF after  PEG placed   Consults Speech, PT   Procedures    L ORIF by Dr. Linna Caprice on 07/20/2014  PEG requested by IR failed on 07-22-14, will Call [facility]   DVT Prophylaxis  Lovenox    Lab Results  Component Value Date   PLT 241 07/23/2014    Medications  Scheduled Meds: . ampicillin-sulbactam (UNASYN) IV  1.5 g Intravenous Q6H  . antiseptic oral rinse  7 mL Mouth Rinse q12n4p  . azithromycin  500 mg Intravenous Q24H  . budesonide-formoterol  2 puff Inhalation BID  . chlorhexidine  15 mL Mouth Rinse BID  . enoxaparin (LOVENOX) injection  30 mg Subcutaneous Q24H  . feeding supplement (ENSURE ENLIVE)  237 mL Oral BID BM  . folic acid  1 mg Oral Daily  . levalbuterol  1.25 mg Nebulization TID  . multivitamin with minerals  1 tablet Oral Daily  . nebivolol  5 mg Oral Daily  . thiamine  100 mg Oral Daily  . tiotropium  18 mcg Inhalation Daily   Continuous Infusions:   PRN Meds:.acetaminophen **OR** [DISCONTINUED] acetaminophen, albuterol, guaiFENesin, haloperidol lactate, hydrALAZINE, HYDROcodone-acetaminophen, LORazepam **OR** LORazepam, metoprolol, morphine injection, [DISCONTINUED] ondansetron **OR** ondansetron (ZOFRAN) IV  Antibiotics     Anti-infectives    Start     Dose/Rate Route Frequency Ordered Stop   07/21/14 0000  ceFAZolin (ANCEF) IVPB 2 g/50 mL premix     2 g 100 mL/hr over 30 Minutes Intravenous Every 6 hours 07/20/14 2139 07/21/14 0646   07/20/14 1000  ceFAZolin (ANCEF) IVPB 2 g/50 mL premix     2 g 100 mL/hr over 30 Minutes Intravenous  Once 07/19/14 2133 07/20/14 1833   07/19/14 1000  ampicillin-sulbactam (UNASYN) 1.5 g in sodium chloride 0.9 % 50 mL IVPB     1.5 g 100 mL/hr over 30 Minutes Intravenous Every 6 hours 07/19/14 0812     07/18/14 1400  azithromycin (ZITHROMAX) 500 mg in dextrose 5 % 250 mL IVPB     500 mg 250 mL/hr over 60 Minutes Intravenous Every 24 hours 07/17/14 1612     07/18/14 0200  ampicillin-sulbactam (UNASYN) 1.5 g in sodium chloride 0.9 %  50 mL IVPB  Status:  Discontinued     1.5 g 100 mL/hr over 30 Minutes Intravenous Every 8 hours 07/17/14 1621 07/19/14 0812   07/18/14 0000  azithromycin (ZITHROMAX) 500 mg in dextrose 5 % 250 mL IVPB  Status:  Discontinued     500 mg 250 mL/hr over 60 Minutes Intravenous Every 24 hours 07/17/14 1344 07/17/14 1612   07/17/14 1500  Ampicillin-Sulbactam (UNASYN) 3 g in sodium chloride 0.9 % 100 mL IVPB     3 g 100 mL/hr over 60 Minutes Intravenous  Once 07/17/14 1432 07/17/14 1900   07/17/14 1230  cefTRIAXone (ROCEPHIN) 1 g in dextrose 5 % 50 mL IVPB  Status:  Discontinued     1 g 100 mL/hr over 30 Minutes Intravenous  Once 07/17/14 1219 07/17/14 1416   07/17/14 1230  azithromycin (ZITHROMAX) 500 mg in dextrose 5 % 250 mL IVPB     500  mg 250 mL/hr over 60 Minutes Intravenous  Once 07/17/14 1219 07/17/14 1550        Objective:   Filed Vitals:   07/23/14 0000 07/23/14 0350 07/23/14 0400 07/23/14 0740  BP: 126/88  158/99   Pulse: 76  76   Temp:  98.6 F (37 C)  98.8 F (37.1 C)  TempSrc:  Oral  Oral  Resp: 16  15   Height:      Weight:      SpO2: 100%  100%     Wt Readings from Last 3 Encounters:  07/17/14 44.3 kg (97 lb 10.6 oz)  05/21/11 57.153 kg (126 lb)  04/23/11 59.421 kg (131 lb)     Intake/Output Summary (Last 24 hours) at 07/23/14 0838 Last data filed at 07/23/14 0537  Gross per 24 hour  Intake   1150 ml  Output   2125 ml  Net   -975 ml     Physical Exam  Awake,Oriented X2, No new F.N deficits, Normal affect .AT,PERRAL Supple Neck,No JVD, No cervical lymphadenopathy appriciated.  Symmetrical Chest wall movement, Good air movement bilaterally, few R Basilar rales RRR,No Gallops,Rubs or new Murmurs, No Parasternal Heave +ve B.Sounds, Abd Soft, No tenderness, No organomegaly appriciated, No rebound - guarding or rigidity. No Cyanosis, Clubbing or edema, No new Rash or bruise , left hip incision appears stable   Data Review   Micro Results Recent  Results (from the past 240 hour(s))  Blood culture (routine x 2)     Status: None   Collection Time: 07/17/14  1:28 PM  Result Value Ref Range Status   Specimen Description BLOOD LEFT HAND  Final   Special Requests BOTTLES DRAWN AEROBIC ONLY 2CC  Final   Culture   Final    NO GROWTH 5 DAYS Note: Culture results may be compromised due to an inadequate volume of blood received in culture bottles. Performed at [facility]    Report Status 07/23/2014 FINAL  Final  Blood culture (routine x 2)     Status: None   Collection Time: 07/17/14  1:39 PM  Result Value Ref Range Status   Specimen Description BLOOD RIGHT HAND  Final   Special Requests BOTTLES DRAWN AEROBIC ONLY 2CC  Final   Culture   Final    NO GROWTH 5 DAYS Performed at [facility]    Report Status 07/23/2014 FINAL  Final  Urine culture     Status: None   Collection Time: 07/17/14  4:40 PM  Result Value Ref Range Status   Specimen Description URINE, CLEAN CATCH  Final   Special Requests NONE  Final   Colony Count NO GROWTH Performed at [facility]   Final   Culture NO GROWTH Performed at [facility]   Final   Report Status 07/18/2014 FINAL  Final  Culture, respiratory (NON-Expectorated)     Status: None   Collection Time: 07/17/14  5:00 PM  Result Value Ref Range Status   Specimen Description SPUTUM  Final   Special Requests NONE  Final   Gram Stain   Final    NO WBC SEEN RARE SQUAMOUS EPITHELIAL CELLS PRESENT FEW GRAM POSITIVE COCCI IN PAIRS RARE GRAM POSITIVE RODS Performed at [facility]    Culture   Final    NORMAL OROPHARYNGEAL FLORA Performed at [facility]    Report Status 07/20/2014 FINAL  Final  MRSA PCR Screening     Status: None   Collection Time: 07/22/14  4:22 PM  Result Value Ref Range Status   MRSA by PCR NEGATIVE NEGATIVE Final    Comment:        The GeneXpert MRSA Assay (FDA approved for NASAL specimens only), is one component of  a comprehensive MRSA colonization surveillance program. It is not intended to diagnose MRSA infection nor to guide or monitor treatment for MRSA infections.     Radiology Reports Ct Abdomen Wo Contrast  07/21/2014   CLINICAL DATA:  69 year old male with generalized weakness. Evaluate anatomy prior to gastrostomy tube placement. History of dysphagia.  EXAM: CT ABDOMEN WITHOUT CONTRAST  TECHNIQUE: Multidetector CT imaging of the abdomen was performed following the standard protocol without IV contrast.  COMPARISON:  No priors.  FINDINGS: Lower chest: Areas of airspace consolidation in the right middle lobe and right lower lobe. Small right pleural effusion layering dependently. Areas of bronchiectasis and potential cavitation in the posterior aspect of the right lower lobe, poorly evaluated on today's noncontrast CT examination. 1.6 x 1.0 cm nodular density in the medial aspect of the left lower lobe (image 13 of series 5). Trace left pleural effusion. Atherosclerotic calcifications in the left anterior descending and right coronary arteries.  Hepatobiliary: No definite cystic or solid hepatic lesions are identified on today's noncontrast CT examination. Gallbladder is not confidently identified and could be collapsed or surgically absent (no surgical clips are noted in the region of the gallbladder fossa).  Pancreas: Pancreas is poorly depicted, but grossly unremarkable.  Spleen: Unremarkable.  Adrenals/Urinary Tract: The unenhanced appearance of the kidneys and adrenal glands is normal bilaterally. No hydroureteronephrosis.  Stomach/Bowel: The unenhanced appearance of the visualized portions of the stomach is unremarkable. Importantly, however, there are loops of small bowel and colon superficial to the visualized portions of the stomach. No pathologic dilatation of the visualized portions of the small bowel or colon.  Vascular/Lymphatic: Atherosclerosis throughout the visualized abdominal vasculature,  without definite aneurysm. No definite lymphadenopathy in the visualized portions of the abdomen on today's noncontrast CT examination.  Other: No significant volume of ascites or definite pneumoperitoneum identified in the abdomen.  Musculoskeletal: Chronic appearing compression fracture of L1 with approximately 20% loss of anterior vertebral body height. There are no aggressive appearing lytic or blastic lesions noted in the visualized portions of the skeleton.  IMPRESSION: 1. Stomach is incompletely visualized on today's examination. The visualized portions of the stomach are remarkable for superficial loops of small bowel and colon interposed between the stomach in the anterior abdominal wall. 2. Airspace consolidation in the right middle and lower lobes concerning for acute infection. This appears be associated with some areas of bronchiectasis and potential cavitation in the posterior aspect of the right lower lobe. There is also a small right parapneumonic pleural effusion. 3. Pleural-based 1.6 x 1.0 cm nodular density in the medial aspect of the left lower lobe. This could also be infectious or inflammatory in etiology, however, the possibility of neoplasm is not excluded, and close attention on future followup examinations is recommended. There is also trace left pleural effusion. At this time, noncontrast chest CT is recommended in 2-3 weeks to assess for resolution of the right lower/middle lobe pneumonia following trial of antimicrobial therapy, and to reassess this left lower lobe nodular region. 4. Extensive atherosclerosis, including at least 2 vessel coronary artery disease. Please note that although the presence of coronary artery calcium documents the presence of coronary artery disease, the severity of this disease and any potential stenosis cannot be assessed on this  non-gated CT examination. Assessment for potential risk factor modification, dietary therapy or pharmacologic therapy may be  warranted, if clinically indicated. 5. Additional incidental findings, as above.   Electronically Signed   By: Trudie Reed M.D.   On: 07/21/2014 20:26   Ct Head Wo Contrast  07/19/2014   CLINICAL DATA:  Headache, hallucinating objects  EXAM: CT HEAD WITHOUT CONTRAST  TECHNIQUE: Contiguous axial images were obtained from the base of the skull through the vertex without intravenous contrast.  COMPARISON:  None  FINDINGS: There is no evidence of mass effect, midline shift, or extra-axial fluid collections. There is no evidence of a space-occupying lesion or intracranial hemorrhage. There is no evidence of a cortical-based area of acute infarction. 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 There is generalized cerebral atrophy. There is periventricular white matter low attenuation likely secondary to microangiopathy.  The ventricles and sulci are appropriate for the patient's age. The basal cisterns are patent.  Visualized portions of the orbits are unremarkable. There is near complete opacification of the left maxillary sinus. There is an air-fluid level in the right sphenoid sinus. There is mild left ethmoid sinus mucosal thickening. Cerebrovascular atherosclerotic calcifications are noted.  The osseous structures are unremarkable.  IMPRESSION: 1. No acute intracranial pathology. 2. Chronic microvascular disease and cerebral atrophy. 3. Right sphenoid and left maxillary sinus disease. Mild left ethmoid sinus disease.   Electronically Signed   By: Elige Ko   On: 07/19/2014 18:01   Pelvis Portable  07/20/2014   CLINICAL DATA:  69 year old status post left hip repair  EXAM: PORTABLE PELVIS 1-2 VIEWS  COMPARISON:  Preoperative radiographs 07/19/2014  FINDINGS: Interval ORIF of intertrochanteric left femoral fracture with intra medullary nail and a transfemoral neck gamma nail. Improved alignment of the fracture fragments. No evidence of acute hardware complication. Expected postoperative subcutaneous  emphysema. Oral contrast material opacifies the colon. Scattered atherosclerotic vascular calcifications are noted.  IMPRESSION: ORIF left intertrochanteric femoral fracture without evidence of complication.   Electronically Signed   By: Malachy Moan M.D.   On: 07/20/2014 21:15   Ir Fluoro Rm 30-60 Min  07/23/2014   CLINICAL DATA:  Dysphagia, aspiration pneumonia. Request for enteral feeding support. Recent CT had demonstrated no safe percutaneous approach to the nondistended stomach due to overlying small bowel and colon.  EXAM: IR FLOURO RM 0-60 MIN  COMPARISON:  CT 07/21/2014  TECHNIQUE: Patient was positioned prone on the supine on the procedure table.  Intravenous Fentanyl and Versed were administered as conscious sedation during continuous cardiorespiratory monitoring by the radiology RN, with a total moderate sedation time of less than 30 minutes.  A 5 French angled angiographic catheter placed as orogastric tube to allow gastric insufflation with air. Under fluoroscopy, using multiple obliquities, it was apparent that the redundant transverse colon and loops of small bowel continued overlie the gastric body and antrum, such that no safe percutaneous approach was available for gastrostomy tube placement.  IMPRESSION: 1. No safe percutaneous approach for gastrostomy tube placement due to overlying bowel. Consider surgical placement if needed.   Electronically Signed   By: Corlis Leak M.D.   On: 07/23/2014 08:10   Dg Chest Port 1 View  07/22/2014   CLINICAL DATA:  Right lower lobe infiltrate. Increasing shortness of breath and chest congestion.  EXAM: PORTABLE CHEST - 1 VIEW  COMPARISON:  07/17/2014  FINDINGS: Slight improvement in the pneumonia in the right lower lobe. New accentuation of the interstitial markings at the left  lung base. Probable small bilateral pleural effusions superimposed on emphysema. Tortuosity of the thoracic aorta. Overall heart size is normal.  IMPRESSION: Slight improvement  in right lower lobe pneumonia. Interstitial accentuation at the left lung base could represent pneumonitis. Is the patient aspirating? COPD. Probable small effusions.   Electronically Signed   By: Francene Boyers M.D.   On: 07/22/2014 07:40   Dg Chest Port 1 View  07/17/2014   CLINICAL DATA:  Pt found on kitchen floor. Believed to be there for approx 4 hrs. Sts he fell but sat himself down, wasn't a "crash." Complains of sob and congestion x3 days. Was too weak to stand up from fall. Has been taking mucinex. Per neighbor, he started coughing up blood clots yesterday.  EXAM: PORTABLE CHEST - 1 VIEW  COMPARISON:  04/27/2004  FINDINGS: There is dense consolidation in knee right lower lung, projecting in the right lower lobe. This is new from the prior study.  Lungs are hyperexpanded consistent with underlying COPD. No other lung consolidation. No pulmonary edema. No pleural effusion or pneumothorax.  Cardiac silhouette is normal in size. Aorta is uncoiled. No mediastinal or hilar masses or convincing adenopathy.  Bony thorax is demineralized but intact.  IMPRESSION: Right lower lobe consolidation consistent with pneumonia. Underlying COPD.   Electronically Signed   By: Amie Portland M.D.   On: 07/17/2014 11:58   Dg Knee Left Port  07/19/2014   CLINICAL DATA:  Status post fall.  EXAM: PORTABLE LEFT KNEE - 1-2 VIEW  COMPARISON:  None.  FINDINGS: There is no evidence of fracture, dislocation, or joint effusion. There is generalized osteopenia. There is no evidence of arthropathy or other focal bone abnormality. Soft tissues are unremarkable. There is peripheral vascular atherosclerotic disease.  IMPRESSION: No acute osseous injury of the left knee.   Electronically Signed   By: Elige Ko   On: 07/19/2014 20:25   Dg Swallowing Func-speech Pathology  07/19/2014    Objective Swallowing Evaluation:    Patient Details  Name: Phillip May MRN: [id number] Date of Birth: Mar 02, 1945  Today's Date: 07/19/2014 Time: SLP Start  Time (ACUTE ONLY): 1120-SLP Stop Time (ACUTE ONLY): 1155 SLP Time Calculation (min) (ACUTE ONLY): 35 min  Past Medical History:  Past Medical History  Diagnosis Date  . Inguinal hernia   . COPD (chronic obstructive pulmonary disease)   . GERD (gastroesophageal reflux disease)   . Chronic hoarseness   . Tobacco abuse   . Cervical spinal stenosis     C4-C5; C5-C6; severe stenosis and abnormal  cord signal C6-C7 s/p  decompression in 04/2004   Past Surgical History:  Past Surgical History  Procedure Laterality Date  . Inguinal hernia repair    . Posterior laminectomy / decompression cervical spine  2006   HPI:  Other Pertinent Information: Phillip May is a 69 y.o. male, history of  COPD (not on oxygen), ongoing smoking, occasional alcohol use, history of  dysphagia on a soft diet per MBS in September 2014, chronic hoarse voice,  GERD, generalized weakness and deconditioning.  PSH + for cervical  decompression surgery in 2006.  Pt reported chronic problems with  hoarseness and dysphagia s/p cervical surgery.  He also experienced 30  pound weight loss within 3 years-pt reported in 2014.  He lives at home  alone and receives help from neighbors for getting groceries and  activities of daily living.  The MBS in September 2014 did show trace  aspiration of thins and penetration of nectar  liquids.  CXR showed right  lower lobe pna.    No Data Recorded  Assessment / Plan / Recommendation CHL IP CLINICAL IMPRESSIONS 07/19/2014  Therapy Diagnosis Severe pharyngeal phase dysphagia;Severe cervical  esophageal phase dysphagia;Moderate pharyngeal phase dysphagia;Moderate  cervical esophageal phase dysphagia  Clinical Impression Known chronic dysphagia with suspected acute  exacerbation.  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    Weakness in pharyngeal contraction and tongue base retraction/poor  epiglottic deflection results in gross pharyngeal residuals that mix  with  secretions without adequate pt sensation.  With liquids, pt conducts  multiple reflexive swallows (x4 - with liquids) with extended breathhold  *presumed to be compensatory to help clear and prevent gross aspiration.   Poor hyolaryngeal elevation allows laryngeal penetration of liquids during  the swallow.  Liquid swallows helpful to reduce solid food residuals in  pharynx.    Mild amount of aspiration (silent) after the swallow noted as barium  spills posterior into open larynx from pyriform sinus.   Pt did not sense  residuals today (mixed with secretions) but cued swallow, liquid follow  solids and cued expectoration (of secretions mixed with barium) effective  to clear 80%.    Per previous MBS in 2014 *Dr Bradly Chris, radiologist stated findings  consistent with esophagram from 08/2012 indicating narrowing of lower  cervical esophagus and dilated cervical esophagus.  SLP today noted area  near cervical hardware where barium pools slightly, clears with reflexive  swallows.   Phillip May again reports desire to eat with known aspiration/malnutrition  risks.  Pt does admit to further weight loss of 5 pounds since 2014 and  states he has "good days and bad days" re: swallowing.  SLP  advised him  to monitor swallowing/nutrition closely to maximize  hydration/nutrition/airway safety.  Pt readily states liquid are easier  for him to consume.    To respect pt wishes, recommend dys3/thin with precautions.  Pt will  require crushed or liquid medications due to level of dysphagia.  Using  live video, educated pt to findings, effective compensations.  Also  recommend pt have suction set up to help with his secretion management.           No flowsheet data found.   CHL IP DIET RECOMMENDATION 07/19/2014  SLP Diet Recommendations Dysphagia 3 (Mech soft);Thin  Liquid Administration via cup  Medication Administration Crushed with puree  Compensations Slow rate;Small sips/bites;Follow solids with  liquid;Multiple dry swallows after  each bite/sip, cough and expectorate  Postural Changes and/or Swallow Maneuvers (None)     CHL IP OTHER RECOMMENDATIONS 07/19/2014  Recommended Consults (None)  Oral Care Recommendations Oral care QID  Other Recommendations (None)     CHL IP FOLLOW UP RECOMMENDATIONS 10/28/2012  Follow up Recommendations None     CHL IP FREQUENCY AND DURATION 07/19/2014  Speech Therapy Frequency (ACUTE ONLY) min 2x/week  Treatment Duration 1 week         CHL IP REASON FOR REFERRAL 07/19/2014  Reason for Referral Objectively evaluate swallowing function     CHL IP ORAL PHASE 07/19/2014                    Oral Phase WFL                     CHL IP PHARYNGEAL PHASE 07/19/2014  Pharyngeal Phase Impaired  Pharyngeal Comment multiple swallows across consistencies, following solid  with liquids helpful to decrease vallecular  residuals, cough and  expectoration helpful, limited postural changes available due to cervical  decompression sx in 06      Donavan Burnet, MS [facility] SLP [phone]    Dg C-arm 1-60 Min  07/20/2014   CLINICAL DATA:  69 year old male with left intertrochanteric femoral fracture undergoing ORIF  EXAM: DG C-ARM 61-120 MIN; LEFT FEMUR 2 VIEWS  COMPARISON:  Preoperative radiographs 07/19/2014  FINDINGS: For intraoperative spot radiographs demonstrate open reduction internal fixation of intertrochanteric femoral fracture with an intra medullary femoral nail and transfemoral neck gamma nail. No evidence of immediate hardware complication. There is a single distal interlocking screw.  IMPRESSION: ORIF of left intertrochanteric femoral fracture without evidence of immediate complication   Electronically Signed   By: Malachy Moan M.D.   On: 07/20/2014 20:39   Dg Hip Unilat With Pelvis 2-3 Views Left  07/19/2014   CLINICAL DATA:  Fall today with left hip pain, initial encounter  EXAM: LEFT HIP (WITH PELVIS) 2-3 VIEWS  COMPARISON:  None.  FINDINGS: There is an a minimally displaced intratrochanteric fracture of the proximal left femur.  No dislocation is noted. Contrast material is seen from prior speech pathology examination. The pelvic ring is otherwise intact. No other focal abnormality is seen.  IMPRESSION: Mildly displaced left intratrochanteric femoral fracture.   Electronically Signed   By: Alcide Clever M.D.   On: 07/19/2014 17:34   Dg Femur Min 2 Views Left  07/20/2014   CLINICAL DATA:  Postop left hip fracture repair  EXAM: LEFT FEMUR 2 VIEWS  COMPARISON:  07/19/2014  FINDINGS: Internal fixation across the left intertrochanteric femoral fracture. Near anatomic alignment. No hardware or bony complicating feature.  IMPRESSION: Internal fixation across the left femoral intertrochanteric fracture. No complicating feature.   Electronically Signed   By: Charlett Nose M.D.   On: 07/20/2014 21:20   Dg Femur Min 2 Views Left  07/20/2014   CLINICAL DATA:  69 year old male with left intertrochanteric femoral fracture undergoing ORIF  EXAM: DG C-ARM 61-120 MIN; LEFT FEMUR 2 VIEWS  COMPARISON:  Preoperative radiographs 07/19/2014  FINDINGS: For intraoperative spot radiographs demonstrate open reduction internal fixation of intertrochanteric femoral fracture with an intra medullary femoral nail and transfemoral neck gamma nail. No evidence of immediate hardware complication. There is a single distal interlocking screw.  IMPRESSION: ORIF of left intertrochanteric femoral fracture without evidence of immediate complication   Electronically Signed   By: Malachy Moan M.D.   On: 07/20/2014 20:39     CBC  Recent Labs Lab 07/18/14 0805 07/20/14 0525 07/21/14 0725 07/22/14 0345 07/23/14 0424  WBC 7.9 7.9 7.9 7.8 6.1  HGB 12.0* 10.4* 9.0* 7.8* 7.3*  HCT 35.1* 30.5* 26.8* 23.5* 21.8*  PLT 227 275 244 196 241  MCV 98.9 99.0 99.6 99.2 100.5*  MCH 33.8 33.8 33.5 32.9 33.6  MCHC 34.2 34.1 33.6 33.2 33.5  RDW 13.0 13.0 13.0 13.0 12.7    Chemistries   Recent Labs Lab 07/17/14 1206 07/18/14 0805 07/19/14 0400 07/20/14 0525  07/21/14 0725 07/22/14 0345  NA 134* 136  --  137 137 138  K 3.7 3.4* 3.7 3.8 3.6 3.5  CL 94* 96*  --  101 100* 104  CO2 31 30  --  24 26 27   GLUCOSE 135* 131*  --  109* 91 89  BUN 44* 24*  --  13 12 12   CREATININE 1.34* 0.88  --  0.95 0.88 0.87  CALCIUM 8.5* 8.1*  --  8.3* 8.1* 7.7*  MG  --   --  1.0*  --  1.3* 1.6*  AST 26  --   --   --   --   --   ALT 18  --   --   --   --   --   ALKPHOS 76  --   --   --   --   --   BILITOT 0.8  --   --   --   --   --    ------------------------------------------------------------------------------------------------------------------ estimated creatinine clearance is 50.9 mL/min (by C-G formula based on Cr of 0.87). ------------------------------------------------------------------------------------------------------------------ No results for input(s): HGBA1C in the last 72 hours. ------------------------------------------------------------------------------------------------------------------ No results for input(s): CHOL, HDL, LDLCALC, TRIG, CHOLHDL, LDLDIRECT in the last 72 hours. ------------------------------------------------------------------------------------------------------------------ No results for input(s): TSH, T4TOTAL, T3FREE, THYROIDAB in the last 72 hours.  Invalid input(s): FREET3 ------------------------------------------------------------------------------------------------------------------ No results for input(s): VITAMINB12, FOLATE, FERRITIN, TIBC, IRON, RETICCTPCT in the last 72 hours.  Coagulation profile  Recent Labs Lab 07/19/14 1906  INR 1.12    No results for input(s): DDIMER in the last 72 hours.  Cardiac Enzymes  Recent Labs Lab 07/17/14 1206  TROPONINI <0.03   ------------------------------------------------------------------------------------------------------------------ Invalid input(s): POCBNP   Time Spent in minutes   35   SINGH,PRASHANT K M.D on 07/23/2014 at 8:38 AM  Between 7am to 7pm  - Pager - [phone]  After 7pm go to [email or web address] - password [facility]  [facility]   Office  [phone]

## 2014-07-23 NOTE — Progress Notes (Signed)
   Subjective:  Patient reports pain as mild.  PEG unable to be placed by IR yesterday due to anatomy.  Objective:   VITALS:   Filed Vitals:   07/22/14 2305 07/23/14 0000 07/23/14 0350 07/23/14 0400  BP:  126/88  158/99  Pulse:  76  76  Temp: 98.8 F (37.1 C)  98.6 F (37 C)   TempSrc: Oral  Oral   Resp:  16  15  Height:      Weight:      SpO2:  100%  100%    ABD soft Sensation intact distally Dorsiflexion/Plantar flexion intact Incision: dressing C/D/I Compartment soft foot warm, pulse dopplerable (unchanged)   Lab Results  Component Value Date   WBC 6.1 07/23/2014   HGB 7.3* 07/23/2014   HCT 21.8* 07/23/2014   MCV 100.5* 07/23/2014   PLT 241 07/23/2014   BMET    Component Value Date/Time   NA 138 07/22/2014 0345   K 3.5 07/22/2014 0345   CL 104 07/22/2014 0345   CO2 27 07/22/2014 0345   GLUCOSE 89 07/22/2014 0345   BUN 12 07/22/2014 0345   CREATININE 0.87 07/22/2014 0345   CALCIUM 7.7* 07/22/2014 0345   GFRNONAA >60 07/22/2014 0345   GFRAA >60 07/22/2014 0345     Assessment/Plan: 3 Days Post-Op   Principal Problem:   CAP (community acquired pneumonia) Active Problems:   Tobacco abuse   COPD, very severe   Dysphagia   Closed comminuted intertrochanteric fracture of proximal femur   Protein-calorie malnutrition   Dysphagia, unspecified(787.20)   Unspecified protein-calorie malnutrition   WBAT with walker Lovenox x30 days PT/OT D/C planning   Janellie Tennison, Taesean Stadtlander 07/23/2014, 7:20 AM   [name], MD Cell [phone]

## 2014-07-23 NOTE — Consult Note (Signed)
General Surgery [facility]  Reason for Consult: gastrostomy tube placement  Referring Physician: Dr. Candiss Norse, [facility]  Phillip May is an 69 y.o. male.  HPI: patient is a 69 yo BM with significant dysphagia, esophageal diverticulum, motility issues, and malnutrition.  IR attempted percutaneous placement of PEG tube, but small intestine and colon felt to be in between anterior stomach wall and abdominal wall.  Asked to evaluate for possible surgical gastrostomy.  Past Medical History  Diagnosis Date  . Inguinal hernia   . COPD (chronic obstructive pulmonary disease)   . GERD (gastroesophageal reflux disease)   . Chronic hoarseness   . Tobacco abuse   . Cervical spinal stenosis     C4-C5; C5-C6; severe stenosis and abnormal  cord signal C6-C7 s/p decompression in 04/2004    Past Surgical History  Procedure Laterality Date  . Inguinal hernia repair    . Posterior laminectomy / decompression cervical spine  2006  . Intramedullary (im) nail intertrochanteric Left 07/20/2014    Procedure: INTRAMEDULLARY (IM) NAIL INTERTROCHANTRIC LEFT HIP;  Surgeon: Rod Can, MD;  Location: [facility];  Service: Orthopedics;  Laterality: Left;    Family History  Problem Relation Age of Onset  . Emphysema Father   . Stomach cancer Sister     Social History:  reports that he has been smoking Cigarettes.  He has a 6 pack-year smoking history. He has never used smokeless tobacco. He reports that he drinks about 7.0 oz of alcohol per week. He reports that he does not use illicit drugs.  Allergies: No Known Allergies  Medications: I have reviewed the patient's current medications.  Results for orders placed or performed during the hospital encounter of 07/17/14 (from the past 48 hour(s))  CBC     Status: Abnormal   Collection Time: 07/22/14  3:45 AM  Result Value Ref Range   WBC 7.8 4.0 - 10.5 K/uL   RBC 2.37 (L) 4.22 - 5.81 MIL/uL   Hemoglobin 7.8 (L) 13.0 - 17.0  g/dL   HCT 23.5 (L) 39.0 - 52.0 %   MCV 99.2 78.0 - 100.0 fL   MCH 32.9 26.0 - 34.0 pg   MCHC 33.2 30.0 - 36.0 g/dL   RDW 13.0 11.5 - 15.5 %   Platelets 196 150 - 400 K/uL  Basic metabolic panel     Status: Abnormal   Collection Time: 07/22/14  3:45 AM  Result Value Ref Range   Sodium 138 135 - 145 mmol/L   Potassium 3.5 3.5 - 5.1 mmol/L   Chloride 104 101 - 111 mmol/L   CO2 27 22 - 32 mmol/L   Glucose, Bld 89 65 - 99 mg/dL   BUN 12 6 - 20 mg/dL   Creatinine, Ser 0.87 0.61 - 1.24 mg/dL   Calcium 7.7 (L) 8.9 - 10.3 mg/dL   GFR calc non Af Amer >60 >60 mL/min   GFR calc Af Amer >60 >60 mL/min    Comment: (NOTE) The eGFR has been calculated using the CKD EPI equation. This calculation has not been validated in all clinical situations. eGFR's persistently <60 mL/min signify possible Chronic Kidney Disease.    Anion gap 7 5 - 15  Magnesium     Status: Abnormal   Collection Time: 07/22/14  3:45 AM  Result Value Ref Range   Magnesium 1.6 (L) 1.7 - 2.4 mg/dL  MRSA PCR Screening     Status: None   Collection Time: 07/22/14  4:22 PM  Result  Value Ref Range   MRSA by PCR NEGATIVE NEGATIVE    Comment:        The GeneXpert MRSA Assay (FDA approved for NASAL specimens only), is one component of a comprehensive MRSA colonization surveillance program. It is not intended to diagnose MRSA infection nor to guide or monitor treatment for MRSA infections.   Glucose, capillary     Status: None   Collection Time: 07/23/14  3:19 AM  Result Value Ref Range   Glucose-Capillary 75 65 - 99 mg/dL  CBC     Status: Abnormal   Collection Time: 07/23/14  4:24 AM  Result Value Ref Range   WBC 6.1 4.0 - 10.5 K/uL   RBC 2.17 (L) 4.22 - 5.81 MIL/uL   Hemoglobin 7.3 (L) 13.0 - 17.0 g/dL   HCT 21.8 (L) 39.0 - 52.0 %   MCV 100.5 (H) 78.0 - 100.0 fL   MCH 33.6 26.0 - 34.0 pg   MCHC 33.5 30.0 - 36.0 g/dL   RDW 12.7 11.5 - 15.5 %   Platelets 241 150 - 400 K/uL  Prepare RBC     Status: None    Collection Time: 07/23/14  8:44 AM  Result Value Ref Range   Order Confirmation ORDER PROCESSED BY BLOOD BANK   Reticulocytes     Status: Abnormal   Collection Time: 07/23/14 10:10 AM  Result Value Ref Range   Retic Ct Pct 0.8 0.4 - 3.1 %   RBC. 2.43 (L) 4.22 - 5.81 MIL/uL   Retic Count, Manual 19.4 19.0 - 186.0 K/uL    Ct Abdomen Wo Contrast  07/21/2014   CLINICAL DATA:  69 year old male with generalized weakness. Evaluate anatomy prior to gastrostomy tube placement. History of dysphagia.  EXAM: CT ABDOMEN WITHOUT CONTRAST  TECHNIQUE: Multidetector CT imaging of the abdomen was performed following the standard protocol without IV contrast.  COMPARISON:  No priors.  FINDINGS: Lower chest: Areas of airspace consolidation in the right middle lobe and right lower lobe. Small right pleural effusion layering dependently. Areas of bronchiectasis and potential cavitation in the posterior aspect of the right lower lobe, poorly evaluated on today's noncontrast CT examination. 1.6 x 1.0 cm nodular density in the medial aspect of the left lower lobe (image 13 of series 5). Trace left pleural effusion. Atherosclerotic calcifications in the left anterior descending and right coronary arteries.  Hepatobiliary: No definite cystic or solid hepatic lesions are identified on today's noncontrast CT examination. Gallbladder is not confidently identified and could be collapsed or surgically absent (no surgical clips are noted in the region of the gallbladder fossa).  Pancreas: Pancreas is poorly depicted, but grossly unremarkable.  Spleen: Unremarkable.  Adrenals/Urinary Tract: The unenhanced appearance of the kidneys and adrenal glands is normal bilaterally. No hydroureteronephrosis.  Stomach/Bowel: The unenhanced appearance of the visualized portions of the stomach is unremarkable. Importantly, however, there are loops of small bowel and colon superficial to the visualized portions of the stomach. No pathologic dilatation  of the visualized portions of the small bowel or colon.  Vascular/Lymphatic: Atherosclerosis throughout the visualized abdominal vasculature, without definite aneurysm. No definite lymphadenopathy in the visualized portions of the abdomen on today's noncontrast CT examination.  Other: No significant volume of ascites or definite pneumoperitoneum identified in the abdomen.  Musculoskeletal: Chronic appearing compression fracture of L1 with approximately 20% loss of anterior vertebral body height. There are no aggressive appearing lytic or blastic lesions noted in the visualized portions of the skeleton.  IMPRESSION: 1. Stomach is incompletely  visualized on today's examination. The visualized portions of the stomach are remarkable for superficial loops of small bowel and colon interposed between the stomach in the anterior abdominal wall. 2. Airspace consolidation in the right middle and lower lobes concerning for acute infection. This appears be associated with some areas of bronchiectasis and potential cavitation in the posterior aspect of the right lower lobe. There is also a small right parapneumonic pleural effusion. 3. Pleural-based 1.6 x 1.0 cm nodular density in the medial aspect of the left lower lobe. This could also be infectious or inflammatory in etiology, however, the possibility of neoplasm is not excluded, and close attention on future followup examinations is recommended. There is also trace left pleural effusion. At this time, noncontrast chest CT is recommended in 2-3 weeks to assess for resolution of the right lower/middle lobe pneumonia following trial of antimicrobial therapy, and to reassess this left lower lobe nodular region. 4. Extensive atherosclerosis, including at least 2 vessel coronary artery disease. Please note that although the presence of coronary artery calcium documents the presence of coronary artery disease, the severity of this disease and any potential stenosis cannot be  assessed on this non-gated CT examination. Assessment for potential risk factor modification, dietary therapy or pharmacologic therapy may be warranted, if clinically indicated. 5. Additional incidental findings, as above.   Electronically Signed   By: Vinnie Langton M.D.   On: 07/21/2014 20:26   Ir Fluoro Rm 30-60 Min  07/23/2014   CLINICAL DATA:  Dysphagia, aspiration pneumonia. Request for enteral feeding support. Recent CT had demonstrated no safe percutaneous approach to the nondistended stomach due to overlying small bowel and colon.  EXAM: IR FLOURO RM 0-60 MIN  COMPARISON:  CT 07/21/2014  TECHNIQUE: Patient was positioned prone on the supine on the procedure table.  Intravenous Fentanyl and Versed were administered as conscious sedation during continuous cardiorespiratory monitoring by the radiology RN, with a total moderate sedation time of less than 30 minutes.  A 5 French angled angiographic catheter placed as orogastric tube to allow gastric insufflation with air. Under fluoroscopy, using multiple obliquities, it was apparent that the redundant transverse colon and loops of small bowel continued overlie the gastric body and antrum, such that no safe percutaneous approach was available for gastrostomy tube placement.  IMPRESSION: 1. No safe percutaneous approach for gastrostomy tube placement due to overlying bowel. Consider surgical placement if needed.   Electronically Signed   By: Lucrezia Europe M.D.   On: 07/23/2014 08:10   Dg Chest Port 1 View  07/22/2014   CLINICAL DATA:  Right lower lobe infiltrate. Increasing shortness of breath and chest congestion.  EXAM: PORTABLE CHEST - 1 VIEW  COMPARISON:  07/17/2014  FINDINGS: Slight improvement in the pneumonia in the right lower lobe. New accentuation of the interstitial markings at the left lung base. Probable small bilateral pleural effusions superimposed on emphysema. Tortuosity of the thoracic aorta. Overall heart size is normal.  IMPRESSION: Slight  improvement in right lower lobe pneumonia. Interstitial accentuation at the left lung base could represent pneumonitis. Is the patient aspirating? COPD. Probable small effusions.   Electronically Signed   By: Lorriane Shire M.D.   On: 07/22/2014 07:40    Review of Systems  HENT: Negative.   Eyes: Negative.   Respiratory: Negative.   Cardiovascular: Negative.   Gastrointestinal: Negative.   Genitourinary: Negative.   Musculoskeletal: Negative.   Skin: Negative.   Neurological: Positive for weakness.  Endo/Heme/Allergies: Negative.   Psychiatric/Behavioral: Negative.  Blood pressure 145/114, pulse 87, temperature 98.6 F (37 C), temperature source Oral, resp. rate 21, height 5' 7"  (1.702 m), weight 44.3 kg (97 lb 10.6 oz), SpO2 100 %. Physical Exam  Constitutional: No distress.  HENT:  Head: Normocephalic and atraumatic.  Right Ear: External ear normal.  Left Ear: External ear normal.  Eyes: Conjunctivae are normal. No scleral icterus.  Neck: Normal range of motion. Neck supple. No thyromegaly present.  Cardiovascular: Normal rate and regular rhythm.   Respiratory: Effort normal and breath sounds normal. No respiratory distress.  GI: Soft. Bowel sounds are normal. He exhibits no distension and no mass. There is no tenderness. There is no rebound and no guarding.  Musculoskeletal: Normal range of motion. He exhibits no edema.  Neurological: He is alert.  Skin: Skin is warm and dry. He is not diaphoretic.  Psychiatric: He has a normal mood and affect. His behavior is normal.    Assessment/Plan: Dysphagia, malnutrition  Patient is surgical candidate for gastrostomy - either place traditional gastrostomy tube or coordinate PEG placement with laparoscopy to position stomach, small intestine, and colon  Will discuss with Dr. Candiss Norse and possibly GI  Anemia - may need transfusion prior to anesthesia  Earnstine Regal, MD, [facility] Office:  [place] 07/23/2014, 11:12 AM

## 2014-07-24 LAB — VITAMIN D 1,25 DIHYDROXY
Vitamin D 1, 25 (OH)2 Total: 40 pg/mL
Vitamin D2 1, 25 (OH)2: <10 pg/mL
Vitamin D3 1, 25 (OH)2: 40 pg/mL

## 2014-07-24 LAB — BASIC METABOLIC PANEL
ANION GAP: 9 (ref 5–15)
BUN: 8 mg/dL (ref 6–20)
CO2: 30 mmol/L (ref 22–32)
CREATININE: 0.66 mg/dL (ref 0.61–1.24)
Calcium: 8.2 mg/dL — ABNORMAL LOW (ref 8.9–10.3)
Chloride: 96 mmol/L — ABNORMAL LOW (ref 101–111)
GFR calc Af Amer: >60 mL/min (ref >60)
GFR calc non Af Amer: >60 mL/min (ref >60)
GLUCOSE: 99 mg/dL (ref 65–99)
Potassium: 3.6 mmol/L (ref 3.5–5.1)
SODIUM: 135 mmol/L (ref 135–145)

## 2014-07-24 LAB — CBC
HCT: 28.9 % — ABNORMAL LOW (ref 39.0–52.0)
Hemoglobin: 10.2 g/dL — ABNORMAL LOW (ref 13.0–17.0)
MCH: 32.7 pg (ref 26.0–34.0)
MCHC: 35.3 g/dL (ref 30.0–36.0)
MCV: 92.6 fL (ref 78.0–100.0)
PLATELETS: 283 10*3/uL (ref 150–400)
RBC: 3.12 MIL/uL — ABNORMAL LOW (ref 4.22–5.81)
RDW: 14 % (ref 11.5–15.5)
WBC: 8.9 10*3/uL (ref 4.0–10.5)

## 2014-07-24 LAB — MAGNESIUM: Magnesium: 1.3 mg/dL — ABNORMAL LOW (ref 1.7–2.4)

## 2014-07-24 MED ORDER — KCL IN DEXTROSE-NACL 10-5-0.45 MEQ/L-%-% IV SOLN
INTRAVENOUS | Status: DC
  Administered 2014-07-24 (×2): via INTRAVENOUS
  Filled 2014-07-24 (×4): qty 1000

## 2014-07-24 MED ORDER — MAGNESIUM SULFATE 2 GM/50ML IV SOLN
2.0000 g | Freq: Once | INTRAVENOUS | Status: AC
  Administered 2014-07-24: 2 g via INTRAVENOUS
  Filled 2014-07-24: qty 50

## 2014-07-24 NOTE — Progress Notes (Signed)
**Note Phillip-Identified via Obfuscation** Patient Demographics  Phillip May, is a 69 y.o. male, DOB - 1945/05/12, WUJ:[id number]  Admit date - 07/17/2014   Admitting Physician Leroy Sea, MD  Outpatient Primary MD for the patient is Londell Moh, MD  LOS - 7   Chief Complaint  Patient presents with  . Shortness of Breath      Summary   Phillip May is a 69 y.o. male, history of COPD follows with Dr. Marchelle Gearing not on oxygen, ongoing smoking, occasional alcohol use, history of dysphagia on a soft diet per speech therapy eval in 2014, chronic hoarse voice, GERD, generalized weakness and deconditioning. Who lives at home by himself with help from neighbors and getting groceries and activities of daily living.  Patient was brought into the ER after a neighbor found him on the floor of his kitchen, and apparently patient was feeling weak and sat himself on the floor, he did not hit the floor or hurt himself. He states that he has been gradually developing dysphagia over the course of few years, likely aspirated and choked on food about a week ago, since then has had a productive cough with some shortness of breath, no fevers, has been not eating or drinking well since that time and getting gradually weak. Finally slumped to the floor as above and was brought to the ER.  In the ER workups at assistive of acute on chronic respiratory failure due to aspiration/community-acquired pneumonia. He also had signs of dehydration with elevated lactate/early sepsis. I was called to admit the patient.  2 days into the admission Patient got confused, fell, L Hip fracture, ORIF 07-20-14, continues to Aspirate and now wants a PEG understands risks and benefits.  Initial attempt for PEG tube placement by IR failed on 07/22/2014, Gen. surgery consulted on  07/23/2014 they are planning for PEG tube placement coming Monday.   Subjective:   Phillip May today has, No headache, No chest pain, No abdominal pain - No Nausea, No new weakness tingling or numbness, much improved Cough - SOB. L hip pain,feels better today.  Assessment & Plan    1. L hip fracture due to fall caused by Dilirium - treat as post ORIF on 07/20/2014 by Dr. [name], mild perioperative blood loss related anemia needing 1 unit PRBC on 07-23-14, initiate PT will require placement. Per orthopedics Lovenox for DVT prophylaxis, weightbearing as tolerated per Ortho.   2. Acute on chronic respiratory failure due to aspiration/committee acquired pneumonia. Much improved after being nothing by mouth and with empiric anti-biotics which include Unasyn and azithromycin. Stopped Azithromycin on 07/23/2014 and leave him on Unasyn for now, NPO, monitor cultures. Continue supportive care with oxygen and nebulizer treatments. 02 as needed.   3. Chronic dysphagia H/O esophageal diverticulum. Now evidence of aspiration. Speech following - Nothing by mouth except medications, PEG requested by IR , IR failed on 07/22/2014 due to overlying small bowel, consulted general surgery on 07/23/2014, due for PEG tube placement likely coming Monday. Patient understands the risks and benefits, family as well.   4. Sepsis as in #1 above, resolved after IV fluids with bolus and maintenance. Repeat lactate is normal.   5. History of COPD and smoking. No wheezing, supportive care as in #1 above. Counseled  to quit smoking.   6. Generalized weakness and deconditioning. PT eval done and recommended SNF, social work consulted.   7. Dehydration. Clinically resolved after hydration.   8. Chronic anxiety. Home dose and as continue.   9. Hypokalemia. Replaced and stable with magnesium levels.   10. Alcohol withdrawal. Placed on CIWA protocol. Counseled.   11. Anemia worse after surgery due to blood  loss due to perioperative period. Inconclusive anemia panel, transfused one unit on 07/23/2014 due to generalized weakness deconditioning.    Code Status: Full  Family Communication: Discussed with son 2 daughter 1  Disposition Plan: SNF likely Tuesday after PEG placed on Monday   Consults Speech, PT   Procedures    L ORIF by Dr. Linna Caprice on 07/20/2014  PEG requested by IR failed on 07-22-14, consulted [facility]   DVT Prophylaxis  Lovenox    Lab Results  Component Value Date   PLT 241 07/23/2014    Medications  Scheduled Meds: . ampicillin-sulbactam (UNASYN) IV  1.5 g Intravenous Q6H  . antiseptic oral rinse  7 mL Mouth Rinse q12n4p  . budesonide-formoterol  2 puff Inhalation BID  . chlorhexidine  15 mL Mouth Rinse BID  . enoxaparin (LOVENOX) injection  30 mg Subcutaneous Q24H  . feeding supplement (ENSURE ENLIVE)  237 mL Oral BID BM  . folic acid  1 mg Oral Daily  . levalbuterol  1.25 mg Nebulization TID  . multivitamin with minerals  1 tablet Oral Daily  . nebivolol  5 mg Oral Daily  . thiamine  100 mg Oral Daily  . tiotropium  18 mcg Inhalation Daily   Continuous Infusions: . dextrose 5 % and 0.45% NaCl 75 mL/hr at 07/24/14 0300   PRN Meds:.acetaminophen **OR** [DISCONTINUED] acetaminophen, albuterol, diphenhydrAMINE, guaiFENesin, haloperidol lactate, hydrALAZINE, HYDROcodone-acetaminophen, LORazepam **OR** LORazepam, metoprolol, morphine injection, [DISCONTINUED] ondansetron **OR** ondansetron (ZOFRAN) IV  Antibiotics     Anti-infectives    Start     Dose/Rate Route Frequency Ordered Stop   07/21/14 0000  ceFAZolin (ANCEF) IVPB 2 g/50 mL premix     2 g 100 mL/hr over 30 Minutes Intravenous Every 6 hours 07/20/14 2139 07/21/14 0646   07/20/14 1000  ceFAZolin (ANCEF) IVPB 2 g/50 mL premix     2 g 100 mL/hr over 30 Minutes Intravenous  Once 07/19/14 2133 07/20/14 1833   07/19/14 1000  ampicillin-sulbactam (UNASYN) 1.5 g in sodium chloride 0.9 % 50 mL IVPB      1.5 g 100 mL/hr over 30 Minutes Intravenous Every 6 hours 07/19/14 0812     07/18/14 1400  azithromycin (ZITHROMAX) 500 mg in dextrose 5 % 250 mL IVPB  Status:  Discontinued     500 mg 250 mL/hr over 60 Minutes Intravenous Every 24 hours 07/17/14 1612 07/23/14 0841   07/18/14 0200  ampicillin-sulbactam (UNASYN) 1.5 g in sodium chloride 0.9 % 50 mL IVPB  Status:  Discontinued     1.5 g 100 mL/hr over 30 Minutes Intravenous Every 8 hours 07/17/14 1621 07/19/14 0812   07/18/14 0000  azithromycin (ZITHROMAX) 500 mg in dextrose 5 % 250 mL IVPB  Status:  Discontinued     500 mg 250 mL/hr over 60 Minutes Intravenous Every 24 hours 07/17/14 1344 07/17/14 1612   07/17/14 1500  Ampicillin-Sulbactam (UNASYN) 3 g in sodium chloride 0.9 % 100 mL IVPB     3 g 100 mL/hr over 60 Minutes Intravenous  Once 07/17/14 1432 07/17/14 1900   07/17/14 1230  cefTRIAXone (ROCEPHIN) 1 g in  dextrose 5 % 50 mL IVPB  Status:  Discontinued     1 g 100 mL/hr over 30 Minutes Intravenous  Once 07/17/14 1219 07/17/14 1416   07/17/14 1230  azithromycin (ZITHROMAX) 500 mg in dextrose 5 % 250 mL IVPB     500 mg 250 mL/hr over 60 Minutes Intravenous  Once 07/17/14 1219 07/17/14 1550        Objective:   Filed Vitals:   07/23/14 2056 07/24/14 0524 07/24/14 0803 07/24/14 0806  BP: 169/99 157/70    Pulse: 84 74    Temp: 97.4 F (36.3 C) 97.8 F (36.6 C)    TempSrc: Oral Oral    Resp: 20 20    Height:      Weight:      SpO2: 97% 96% 95% 95%    Wt Readings from Last 3 Encounters:  07/23/14 47.7 kg (105 lb 2.6 oz)  05/21/11 57.153 kg (126 lb)  04/23/11 59.421 kg (131 lb)     Intake/Output Summary (Last 24 hours) at 07/24/14 0937 Last data filed at 07/24/14 0538  Gross per 24 hour  Intake 1795.83 ml  Output    975 ml  Net 820.83 ml     Physical Exam  Awake,Oriented X2, No new F.N deficits, Normal affect [place].AT,PERRAL Supple Neck,No JVD, No cervical lymphadenopathy appriciated.  Symmetrical Chest wall  movement, Good air movement bilaterally, few R Basilar rales RRR,No Gallops,Rubs or new Murmurs, No Parasternal Heave +ve B.Sounds, Abd Soft, No tenderness, No organomegaly appriciated, No rebound - guarding or rigidity. No Cyanosis, Clubbing or edema, No new Rash or bruise , left hip incision appears stable   Data Review   Micro Results Recent Results (from the past 240 hour(s))  Blood culture (routine x 2)     Status: None   Collection Time: 07/17/14  1:28 PM  Result Value Ref Range Status   Specimen Description BLOOD LEFT HAND  Final   Special Requests BOTTLES DRAWN AEROBIC ONLY 2CC  Final   Culture   Final    NO GROWTH 5 DAYS Note: Culture results may be compromised due to an inadequate volume of blood received in culture bottles. Performed at [facility]    Report Status 07/23/2014 FINAL  Final  Blood culture (routine x 2)     Status: None   Collection Time: 07/17/14  1:39 PM  Result Value Ref Range Status   Specimen Description BLOOD RIGHT HAND  Final   Special Requests BOTTLES DRAWN AEROBIC ONLY 2CC  Final   Culture   Final    NO GROWTH 5 DAYS Performed at [facility]    Report Status 07/23/2014 FINAL  Final  Urine culture     Status: None   Collection Time: 07/17/14  4:40 PM  Result Value Ref Range Status   Specimen Description URINE, CLEAN CATCH  Final   Special Requests NONE  Final   Colony Count NO GROWTH Performed at [facility]   Final   Culture NO GROWTH Performed at [facility]   Final   Report Status 07/18/2014 FINAL  Final  Culture, respiratory (NON-Expectorated)     Status: None   Collection Time: 07/17/14  5:00 PM  Result Value Ref Range Status   Specimen Description SPUTUM  Final   Special Requests NONE  Final   Gram Stain   Final    NO WBC SEEN RARE SQUAMOUS EPITHELIAL CELLS PRESENT FEW GRAM POSITIVE COCCI IN PAIRS RARE GRAM POSITIVE RODS Performed  at [facility]    Culture   Final    NORMAL  OROPHARYNGEAL FLORA Performed at [facility]    Report Status 07/20/2014 FINAL  Final  MRSA PCR Screening     Status: None   Collection Time: 07/22/14  4:22 PM  Result Value Ref Range Status   MRSA by PCR NEGATIVE NEGATIVE Final    Comment:        The GeneXpert MRSA Assay (FDA approved for NASAL specimens only), is one component of a comprehensive MRSA colonization surveillance program. It is not intended to diagnose MRSA infection nor to guide or monitor treatment for MRSA infections.     Radiology Reports Ct Abdomen Wo Contrast  07/21/2014   CLINICAL DATA:  69 year old male with generalized weakness. Evaluate anatomy prior to gastrostomy tube placement. History of dysphagia.  EXAM: CT ABDOMEN WITHOUT CONTRAST  TECHNIQUE: Multidetector CT imaging of the abdomen was performed following the standard protocol without IV contrast.  COMPARISON:  No priors.  FINDINGS: Lower chest: Areas of airspace consolidation in the right middle lobe and right lower lobe. Small right pleural effusion layering dependently. Areas of bronchiectasis and potential cavitation in the posterior aspect of the right lower lobe, poorly evaluated on today's noncontrast CT examination. 1.6 x 1.0 cm nodular density in the medial aspect of the left lower lobe (image 13 of series 5). Trace left pleural effusion. Atherosclerotic calcifications in the left anterior descending and right coronary arteries.  Hepatobiliary: No definite cystic or solid hepatic lesions are identified on today's noncontrast CT examination. Gallbladder is not confidently identified and could be collapsed or surgically absent (no surgical clips are noted in the region of the gallbladder fossa).  Pancreas: Pancreas is poorly depicted, but grossly unremarkable.  Spleen: Unremarkable.  Adrenals/Urinary Tract: The unenhanced appearance of the kidneys and adrenal glands is normal bilaterally. No hydroureteronephrosis.  Stomach/Bowel: The unenhanced  appearance of the visualized portions of the stomach is unremarkable. Importantly, however, there are loops of small bowel and colon superficial to the visualized portions of the stomach. No pathologic dilatation of the visualized portions of the small bowel or colon.  Vascular/Lymphatic: Atherosclerosis throughout the visualized abdominal vasculature, without definite aneurysm. No definite lymphadenopathy in the visualized portions of the abdomen on today's noncontrast CT examination.  Other: No significant volume of ascites or definite pneumoperitoneum identified in the abdomen.  Musculoskeletal: Chronic appearing compression fracture of L1 with approximately 20% loss of anterior vertebral body height. There are no aggressive appearing lytic or blastic lesions noted in the visualized portions of the skeleton.  IMPRESSION: 1. Stomach is incompletely visualized on today's examination. The visualized portions of the stomach are remarkable for superficial loops of small bowel and colon interposed between the stomach in the anterior abdominal wall. 2. Airspace consolidation in the right middle and lower lobes concerning for acute infection. This appears be associated with some areas of bronchiectasis and potential cavitation in the posterior aspect of the right lower lobe. There is also a small right parapneumonic pleural effusion. 3. Pleural-based 1.6 x 1.0 cm nodular density in the medial aspect of the left lower lobe. This could also be infectious or inflammatory in etiology, however, the possibility of neoplasm is not excluded, and close attention on future followup examinations is recommended. There is also trace left pleural effusion. At this time, noncontrast chest CT is recommended in 2-3 weeks to assess for resolution of the right lower/middle lobe pneumonia following trial of antimicrobial therapy, and to reassess this  left lower lobe nodular region. 4. Extensive atherosclerosis, including at least 2 vessel  coronary artery disease. Please note that although the presence of coronary artery calcium documents the presence of coronary artery disease, the severity of this disease and any potential stenosis cannot be assessed on this non-gated CT examination. Assessment for potential risk factor modification, dietary therapy or pharmacologic therapy may be warranted, if clinically indicated. 5. Additional incidental findings, as above.   Electronically Signed   By: Trudie Reed M.D.   On: 07/21/2014 20:26   Ct Head Wo Contrast  07/19/2014   CLINICAL DATA:  Headache, hallucinating objects  EXAM: CT HEAD WITHOUT CONTRAST  TECHNIQUE: Contiguous axial images were obtained from the base of the skull through the vertex without intravenous contrast.  COMPARISON:  None  FINDINGS: There is no evidence of mass effect, midline shift, or extra-axial fluid collections. There is no evidence of a space-occupying lesion or intracranial hemorrhage. There is no evidence of a cortical-based area of acute infarction. 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 There is generalized cerebral atrophy. There is periventricular white matter low attenuation likely secondary to microangiopathy.  The ventricles and sulci are appropriate for the patient's age. The basal cisterns are patent.  Visualized portions of the orbits are unremarkable. There is near complete opacification of the left maxillary sinus. There is an air-fluid level in the right sphenoid sinus. There is mild left ethmoid sinus mucosal thickening. Cerebrovascular atherosclerotic calcifications are noted.  The osseous structures are unremarkable.  IMPRESSION: 1. No acute intracranial pathology. 2. Chronic microvascular disease and cerebral atrophy. 3. Right sphenoid and left maxillary sinus disease. Mild left ethmoid sinus disease.   Electronically Signed   By: Elige Ko   On: 07/19/2014 18:01   Pelvis Portable  07/20/2014   CLINICAL DATA:  69 year old status post left  hip repair  EXAM: PORTABLE PELVIS 1-2 VIEWS  COMPARISON:  Preoperative radiographs 07/19/2014  FINDINGS: Interval ORIF of intertrochanteric left femoral fracture with intra medullary nail and a transfemoral neck gamma nail. Improved alignment of the fracture fragments. No evidence of acute hardware complication. Expected postoperative subcutaneous emphysema. Oral contrast material opacifies the colon. Scattered atherosclerotic vascular calcifications are noted.  IMPRESSION: ORIF left intertrochanteric femoral fracture without evidence of complication.   Electronically Signed   By: Malachy Moan M.D.   On: 07/20/2014 21:15   Ir Fluoro Rm 30-60 Min  07/23/2014   CLINICAL DATA:  Dysphagia, aspiration pneumonia. Request for enteral feeding support. Recent CT had demonstrated no safe percutaneous approach to the nondistended stomach due to overlying small bowel and colon.  EXAM: IR FLOURO RM 0-60 MIN  COMPARISON:  CT 07/21/2014  TECHNIQUE: Patient was positioned prone on the supine on the procedure table.  Intravenous Fentanyl and Versed were administered as conscious sedation during continuous cardiorespiratory monitoring by the radiology RN, with a total moderate sedation time of less than 30 minutes.  A 5 French angled angiographic catheter placed as orogastric tube to allow gastric insufflation with air. Under fluoroscopy, using multiple obliquities, it was apparent that the redundant transverse colon and loops of small bowel continued overlie the gastric body and antrum, such that no safe percutaneous approach was available for gastrostomy tube placement.  IMPRESSION: 1. No safe percutaneous approach for gastrostomy tube placement due to overlying bowel. Consider surgical placement if needed.   Electronically Signed   By: Corlis Leak M.D.   On: 07/23/2014 08:10   Dg Chest Port 1 View  07/22/2014  CLINICAL DATA:  Right lower lobe infiltrate. Increasing shortness of breath and chest congestion.  EXAM:  PORTABLE CHEST - 1 VIEW  COMPARISON:  07/17/2014  FINDINGS: Slight improvement in the pneumonia in the right lower lobe. New accentuation of the interstitial markings at the left lung base. Probable small bilateral pleural effusions superimposed on emphysema. Tortuosity of the thoracic aorta. Overall heart size is normal.  IMPRESSION: Slight improvement in right lower lobe pneumonia. Interstitial accentuation at the left lung base could represent pneumonitis. Is the patient aspirating? COPD. Probable small effusions.   Electronically Signed   By: Francene Boyers M.D.   On: 07/22/2014 07:40   Dg Chest Port 1 View  07/17/2014   CLINICAL DATA:  Pt found on kitchen floor. Believed to be there for approx 4 hrs. Sts he fell but sat himself down, wasn't a "crash." Complains of sob and congestion x3 days. Was too weak to stand up from fall. Has been taking mucinex. Per neighbor, he started coughing up blood clots yesterday.  EXAM: PORTABLE CHEST - 1 VIEW  COMPARISON:  04/27/2004  FINDINGS: There is dense consolidation in knee right lower lung, projecting in the right lower lobe. This is new from the prior study.  Lungs are hyperexpanded consistent with underlying COPD. No other lung consolidation. No pulmonary edema. No pleural effusion or pneumothorax.  Cardiac silhouette is normal in size. Aorta is uncoiled. No mediastinal or hilar masses or convincing adenopathy.  Bony thorax is demineralized but intact.  IMPRESSION: Right lower lobe consolidation consistent with pneumonia. Underlying COPD.   Electronically Signed   By: Amie Portland M.D.   On: 07/17/2014 11:58   Dg Knee Left Port  07/19/2014   CLINICAL DATA:  Status post fall.  EXAM: PORTABLE LEFT KNEE - 1-2 VIEW  COMPARISON:  None.  FINDINGS: There is no evidence of fracture, dislocation, or joint effusion. There is generalized osteopenia. There is no evidence of arthropathy or other focal bone abnormality. Soft tissues are unremarkable. There is peripheral vascular  atherosclerotic disease.  IMPRESSION: No acute osseous injury of the left knee.   Electronically Signed   By: Elige Ko   On: 07/19/2014 20:25   Dg Swallowing Func-speech Pathology  07/19/2014    Objective Swallowing Evaluation:    Patient Details  Name: HANSEN CARINO MRN: [id number] Date of Birth: 1945/05/10  Today's Date: 07/19/2014 Time: SLP Start Time (ACUTE ONLY): 1120-SLP Stop Time (ACUTE ONLY): 1155 SLP Time Calculation (min) (ACUTE ONLY): 35 min  Past Medical History:  Past Medical History  Diagnosis Date  . Inguinal hernia   . COPD (chronic obstructive pulmonary disease)   . GERD (gastroesophageal reflux disease)   . Chronic hoarseness   . Tobacco abuse   . Cervical spinal stenosis     C4-C5; C5-C6; severe stenosis and abnormal  cord signal C6-C7 s/p  decompression in 04/2004   Past Surgical History:  Past Surgical History  Procedure Laterality Date  . Inguinal hernia repair    . Posterior laminectomy / decompression cervical spine  2006   HPI:  Other Pertinent Information: Phillip May is a 70 y.o. male, history of  COPD (not on oxygen), ongoing smoking, occasional alcohol use, history of  dysphagia on a soft diet per MBS in September 2014, chronic hoarse voice,  GERD, generalized weakness and deconditioning.  PSH + for cervical  decompression surgery in 2006.  Pt reported chronic problems with  hoarseness and dysphagia s/p cervical surgery.  He also experienced 30  pound  weight loss within 3 years-pt reported in 2014.  He lives at home  alone and receives help from neighbors for getting groceries and  activities of daily living.  The MBS in September 2014 did show trace  aspiration of thins and penetration of nectar liquids.  CXR showed right  lower lobe pna.    No Data Recorded  Assessment / Plan / Recommendation CHL IP CLINICAL IMPRESSIONS 07/19/2014  Therapy Diagnosis Severe pharyngeal phase dysphagia;Severe cervical  esophageal phase dysphagia;Moderate pharyngeal phase dysphagia;Moderate  cervical  esophageal phase dysphagia  Clinical Impression Known chronic dysphagia with suspected acute  exacerbation.  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    Weakness in pharyngeal contraction and tongue base retraction/poor  epiglottic deflection results in gross pharyngeal residuals that mix with  secretions without adequate pt sensation.  With liquids, pt conducts  multiple reflexive swallows (x4 - with liquids) with extended breathhold  *presumed to be compensatory to help clear and prevent gross aspiration.   Poor hyolaryngeal elevation allows laryngeal penetration of liquids during  the swallow.  Liquid swallows helpful to reduce solid food residuals in  pharynx.    Mild amount of aspiration (silent) after the swallow noted as barium  spills posterior into open larynx from pyriform sinus.   Pt did not sense  residuals today (mixed with secretions) but cued swallow, liquid follow  solids and cued expectoration (of secretions mixed with barium) effective  to clear 80%.    Per previous MBS in 2014 *Dr Bradly Chris, radiologist stated findings  consistent with esophagram from 08/2012 indicating narrowing of lower  cervical esophagus and dilated cervical esophagus.  SLP today noted area  near cervical hardware where barium pools slightly, clears with reflexive  swallows.   Mr Varma again reports desire to eat with known aspiration/malnutrition  risks.  Pt does admit to further weight loss of 5 pounds since 2014 and  states he has "good days and bad days" re: swallowing.  SLP  advised him  to monitor swallowing/nutrition closely to maximize  hydration/nutrition/airway safety.  Pt readily states liquid are easier  for him to consume.    To respect pt wishes, recommend dys3/thin with precautions.  Pt will  require crushed or liquid medications due to level of dysphagia.  Using  live video, educated pt to findings, effective compensations.  Also  recommend pt  have suction set up to help with his secretion management.           No flowsheet data found.   CHL IP DIET RECOMMENDATION 07/19/2014  SLP Diet Recommendations Dysphagia 3 (Mech soft);Thin  Liquid Administration via cup  Medication Administration Crushed with puree  Compensations Slow rate;Small sips/bites;Follow solids with  liquid;Multiple dry swallows after each bite/sip, cough and expectorate  Postural Changes and/or Swallow Maneuvers (None)     CHL IP OTHER RECOMMENDATIONS 07/19/2014  Recommended Consults (None)  Oral Care Recommendations Oral care QID  Other Recommendations (None)     CHL IP FOLLOW UP RECOMMENDATIONS 10/28/2012  Follow up Recommendations None     [facility]L IP FREQUENCY AND DURATION 07/19/2014  Speech Therapy Frequency (ACUTE ONLY) min 2x/week  Treatment Duration 1 week         [facility] IP REASON FOR REFERRAL 07/19/2014  Reason for Referral Objectively evaluate swallowing function     CHL IP ORAL PHASE 07/19/2014                    Oral Phase  WFL                     CHL IP PHARYNGEAL PHASE 07/19/2014  Pharyngeal Phase Impaired  Pharyngeal Comment multiple swallows across consistencies, following solid  with liquids helpful to decrease vallecular residuals, cough and  expectoration helpful, limited postural changes available due to cervical  decompression sx in 06      Donavan Burnet, MS [facility] S[facility] [phone]    Dg C-arm 1-60 Min  07/20/2014   CLINICAL DATA:  69 year old male with left intertrochanteric femoral fracture undergoing ORIF  EXAM: DG C-ARM 61-120 MIN; LEFT FEMUR 2 VIEWS  COMPARISON:  Preoperative radiographs 07/19/2014  FINDINGS: For intraoperative spot radiographs demonstrate open reduction internal fixation of intertrochanteric femoral fracture with an intra medullary femoral nail and transfemoral neck gamma nail. No evidence of immediate hardware complication. There is a single distal interlocking screw.  IMPRESSION: ORIF of left intertrochanteric femoral fracture without evidence of immediate complication    Electronically Signed   By: Malachy Moan M.D.   On: 07/20/2014 20:39   Dg Hip Unilat With Pelvis 2-3 Views Left  07/19/2014   CLINICAL DATA:  Fall today with left hip pain, initial encounter  EXAM: LEFT HIP (WITH PELVIS) 2-3 VIEWS  COMPARISON:  None.  FINDINGS: There is an a minimally displaced intratrochanteric fracture of the proximal left femur. No dislocation is noted. Contrast material is seen from prior speech pathology examination. The pelvic ring is otherwise intact. No other focal abnormality is seen.  IMPRESSION: Mildly displaced left intratrochanteric femoral fracture.   Electronically Signed   By: Alcide Clever M.D.   On: 07/19/2014 17:34   Dg Femur Min 2 Views Left  07/20/2014   CLINICAL DATA:  Postop left hip fracture repair  EXAM: LEFT FEMUR 2 VIEWS  COMPARISON:  07/19/2014  FINDINGS: Internal fixation across the left intertrochanteric femoral fracture. Near anatomic alignment. No hardware or bony complicating feature.  IMPRESSION: Internal fixation across the left femoral intertrochanteric fracture. No complicating feature.   Electronically Signed   By: Charlett Nose M.D.   On: 07/20/2014 21:20   Dg Femur Min 2 Views Left  07/20/2014   CLINICAL DATA:  69 year old male with left intertrochanteric femoral fracture undergoing ORIF  EXAM: DG C-ARM 61-120 MIN; LEFT FEMUR 2 VIEWS  COMPARISON:  Preoperative radiographs 07/19/2014  FINDINGS: For intraoperative spot radiographs demonstrate open reduction internal fixation of intertrochanteric femoral fracture with an intra medullary femoral nail and transfemoral neck gamma nail. No evidence of immediate hardware complication. There is a single distal interlocking screw.  IMPRESSION: ORIF of left intertrochanteric femoral fracture without evidence of immediate complication   Electronically Signed   By: Malachy Moan M.D.   On: 07/20/2014 20:39     CBC  Recent Labs Lab 07/18/14 0805 07/20/14 0525 07/21/14 0725 07/22/14 0345  07/23/14 0424 07/23/14 1550  WBC 7.9 7.9 7.9 7.8 6.1  --   HGB 12.0* 10.4* 9.0* 7.8* 7.3* 12.9*  HCT 35.1* 30.5* 26.8* 23.5* 21.8* 37.8*  PLT 227 275 244 196 241  --   MCV 98.9 99.0 99.6 99.2 100.5*  --   MCH 33.8 33.8 33.5 32.9 33.6  --   MCHC 34.2 34.1 33.6 33.2 33.5  --   RDW 13.0 13.0 13.0 13.0 12.7  --     Chemistries   Recent Labs Lab 07/17/14 1206 07/18/14 0805 07/19/14 0400 07/20/14 0525 07/21/14 0725 07/22/14 0345  NA 134* 136  --  137 137 138  K 3.7 3.4* 3.7 3.8 3.6 3.5  CL 94* 96*  --  101 100* 104  CO2 31 30  --  24 26 27   GLUCOSE 135* 131*  --  109* 91 89  BUN 44* 24*  --  13 12 12   CREATININE 1.34* 0.88  --  0.95 0.88 0.87  CALCIUM 8.5* 8.1*  --  8.3* 8.1* 7.7*  MG  --   --  1.0*  --  1.3* 1.6*  AST 26  --   --   --   --   --   ALT 18  --   --   --   --   --   ALKPHOS 76  --   --   --   --   --   BILITOT 0.8  --   --   --   --   --    ------------------------------------------------------------------------------------------------------------------ estimated creatinine clearance is 54.8 mL/min (by C-G formula based on Cr of 0.87). ------------------------------------------------------------------------------------------------------------------ No results for input(s): HGBA1C in the last 72 hours. ------------------------------------------------------------------------------------------------------------------ No results for input(s): CHOL, HDL, LDLCALC, TRIG, CHOLHDL, LDLDIRECT in the last 72 hours. ------------------------------------------------------------------------------------------------------------------ No results for input(s): TSH, T4TOTAL, T3FREE, THYROIDAB in the last 72 hours.  Invalid input(s): FREET3 ------------------------------------------------------------------------------------------------------------------  Recent Labs  07/23/14 1010  VITAMINB12 665  FOLATE 9.6  FERRITIN 313  TIBC 151*  IRON 27*  RETICCTPCT 0.8     Coagulation profile  Recent Labs Lab 07/19/14 1906  INR 1.12    No results for input(s): DDIMER in the last 72 hours.  Cardiac Enzymes  Recent Labs Lab 07/17/14 1206  TROPONINI <0.03   ------------------------------------------------------------------------------------------------------------------ Invalid input(s): POCBNP   Time Spent in minutes   35   Brisha Mccabe K M.D on 07/24/2014 at 9:37 AM  Between 7am to 7pm - Pager - [phone]  After 7pm go to [email or web address] - password [email or web address]  [facility]   Office  [phone]

## 2014-07-24 NOTE — Progress Notes (Signed)
Physical Therapy Treatment Patient Details Name: Phillip May MRN: [id number] DOB: Sep 26, 1945 Today's Date: 07/24/2014    History of Present Illness 69 yo male admitted 07/17/14 with Pna, fall. Hx of COPD, cervical spinal stenosis. Pt is from home alone. Pt s/p fall on 07/19/14 with L intertrochantric fracture and underwent IM nailing.    PT Comments    Pt continues to require +2 Total A with mobility.  Pt reports he just feels so weak due to not eating.  Noted pt may be getting PEG tube Monday.  Hopefully pt will get more energy for mobility after this.  Congested cough and using suction during session.  Follow Up Recommendations  SNF     Equipment Recommendations  None recommended by PT    Recommendations for Other Services       Precautions / Restrictions Precautions Precautions: Fall Restrictions LLE Weight Bearing: Weight bearing as tolerated    Mobility  Bed Mobility Overal bed mobility: Needs Assistance;+2 for physical assistance Bed Mobility: Supine to Sit;Sit to Supine     Supine to sit: +2 for physical assistance;Total assist Sit to supine: Total assist;+2 for physical assistance   General bed mobility comments: Pt able to give much effort at all with supine <> sit transfers despite cues.  Use of bed pad to A with mobility.  Transfers Overall transfer level: Needs assistance Equipment used: Rolling walker (2 wheeled) Transfers: Sit to/from Stand Sit to Stand: +2 physical assistance;Total assist         General transfer comment: Pt anxious and on first rep unable to extend hips and keeping legs too far forward.  2nd rep worked on shifting weight forward and to the right to unweight L LE.  Pt anxious stating "I just can't.  This is moving too fast."  Ambulation/Gait                 Stairs            Wheelchair Mobility    Modified Rankin (Stroke Patients Only)       Balance     Sitting balance-Leahy Scale: Poor Sitting balance -  Comments: tendency to lean back   Standing balance support: Bilateral upper extremity supported Standing balance-Leahy Scale: Zero                      Cognition Arousal/Alertness: Awake/alert Behavior During Therapy: Anxious Overall Cognitive Status: No family/caregiver present to determine baseline cognitive functioning                      Exercises General Exercises - Lower Extremity Ankle Circles/Pumps: AROM;Both;5 reps;Supine Quad Sets: 5 reps;Both;Strengthening Gluteal Sets: Strengthening;10 reps Heel Slides: AAROM;Left;10 reps;Supine Hip ABduction/ADduction: AAROM;Left;10 reps;Supine    General Comments        Pertinent Vitals/Pain Pain Assessment: 0-10 Pain Score: 8  Pain Location: forehead and neck Pain Intervention(s): Repositioned;Limited activity within patient's tolerance    Home Living                      Prior Function            PT Goals (current goals can now be found in the care plan section) Acute Rehab PT Goals PT Goal Formulation: With patient Time For Goal Achievement: 08/04/14 Potential to Achieve Goals: Fair Progress towards PT goals: Progressing toward goals    Frequency  Min 3X/week    PT Plan Current plan remains appropriate  Co-evaluation             End of Session Equipment Utilized During Treatment: Gait belt Activity Tolerance: Patient limited by fatigue Patient left: in bed;with call bell/phone within reach;with bed alarm set     Time: 0165-5374 PT Time Calculation (min) (ACUTE ONLY): 27 min  Charges:  $Therapeutic Exercise: 8-22 mins $Therapeutic Activity: 8-22 mins                    G Codes:      Hilman Kissling LUBECK 07/24/2014, 11:17 AM

## 2014-07-24 NOTE — Clinical Documentation Improvement (Signed)
BMI= 15.29 RD states "Severe malnutrition in contest of chronic illness, underweight." in 07/23/14 note  Please clarify if you agree with the RD's assessment and document in progress note and Discharge Summary.  Malnutrition- Mild Malnutrition- Moderate Malnutrition- Severe Other explanation for clinical findings Unable to clinically determine  Thank you,  Sharyn Creamer, BSN, RN [facility] HIM/Clinical Documentation Specialist [email or web address] [phone]

## 2014-07-24 NOTE — Progress Notes (Addendum)
4 Days Post-Op  Subjective: "I'm starving."  Pt wondering why he can't eat.    Objective: Vital signs in last 24 hours: Temp:  [97.4 F (36.3 C)-98.6 F (37 C)] 97.8 F (36.6 C) (06/11 0524) Pulse Rate:  [66-96] 74 (06/11 0524) Resp:  [20-21] 20 (06/11 0524) BP: (127-171)/(70-114) 157/70 mmHg (06/11 0524) SpO2:  [90 %-100 %] 95 % (06/11 0806) Weight:  [47.7 kg (105 lb 2.6 oz)] 47.7 kg (105 lb 2.6 oz) (06/10 1312) Last BM Date: 07/22/14  Intake/Output from previous day: 06/10 0701 - 06/11 0700 In: 1835.8 [I.V.:1228.8; Blood:337.1; IV Piggyback:200] Out: 1100 [Urine:1100] Intake/Output this shift:    General appearance: alert and irritable Resp: breathing comfortably GI: soft, non tender, non distended.  no scars that I can see on abdomen.  Lab Results:   Recent Labs  07/22/14 0345 07/23/14 0424 07/23/14 1550  WBC 7.8 6.1  --   HGB 7.8* 7.3* 12.9*  HCT 23.5* 21.8* 37.8*  PLT 196 241  --    BMET  Recent Labs  07/22/14 0345  NA 138  K 3.5  CL 104  CO2 27  GLUCOSE 89  BUN 12  CREATININE 0.87  CALCIUM 7.7*   PT/INR No results for input(s): LABPROT, INR in the last 72 hours. ABG No results for input(s): PHART, HCO3 in the last 72 hours.  Invalid input(s): PCO2, PO2  Studies/Results: Ir Fluoro Rm 30-60 Min  07/23/2014   CLINICAL DATA:  Dysphagia, aspiration pneumonia. Request for enteral feeding support. Recent CT had demonstrated no safe percutaneous approach to the nondistended stomach due to overlying small bowel and colon.  EXAM: IR FLOURO RM 0-60 MIN  COMPARISON:  CT 07/21/2014  TECHNIQUE: Patient was positioned prone on the supine on the procedure table.  Intravenous Fentanyl and Versed were administered as conscious sedation during continuous cardiorespiratory monitoring by the radiology RN, with a total moderate sedation time of less than 30 minutes.  A 5 French angled angiographic catheter placed as orogastric tube to allow gastric insufflation with  air. Under fluoroscopy, using multiple obliquities, it was apparent that the redundant transverse colon and loops of small bowel continued overlie the gastric body and antrum, such that no safe percutaneous approach was available for gastrostomy tube placement.  IMPRESSION: 1. No safe percutaneous approach for gastrostomy tube placement due to overlying bowel. Consider surgical placement if needed.   Electronically Signed   By: Corlis Leak M.D.   On: 07/23/2014 08:10    Anti-infectives: Anti-infectives    Start     Dose/Rate Route Frequency Ordered Stop   07/21/14 0000  ceFAZolin (ANCEF) IVPB 2 g/50 mL premix     2 g 100 mL/hr over 30 Minutes Intravenous Every 6 hours 07/20/14 2139 07/21/14 0646   07/20/14 1000  ceFAZolin (ANCEF) IVPB 2 g/50 mL premix     2 g 100 mL/hr over 30 Minutes Intravenous  Once 07/19/14 2133 07/20/14 1833   07/19/14 1000  ampicillin-sulbactam (UNASYN) 1.5 g in sodium chloride 0.9 % 50 mL IVPB     1.5 g 100 mL/hr over 30 Minutes Intravenous Every 6 hours 07/19/14 0812     07/18/14 1400  azithromycin (ZITHROMAX) 500 mg in dextrose 5 % 250 mL IVPB  Status:  Discontinued     500 mg 250 mL/hr over 60 Minutes Intravenous Every 24 hours 07/17/14 1612 07/23/14 0841   07/18/14 0200  ampicillin-sulbactam (UNASYN) 1.5 g in sodium chloride 0.9 % 50 mL IVPB  Status:  Discontinued  1.5 g 100 mL/hr over 30 Minutes Intravenous Every 8 hours 07/17/14 1621 07/19/14 0812   07/18/14 0000  azithromycin (ZITHROMAX) 500 mg in dextrose 5 % 250 mL IVPB  Status:  Discontinued     500 mg 250 mL/hr over 60 Minutes Intravenous Every 24 hours 07/17/14 1344 07/17/14 1612   07/17/14 1500  Ampicillin-Sulbactam (UNASYN) 3 g in sodium chloride 0.9 % 100 mL IVPB     3 g 100 mL/hr over 60 Minutes Intravenous  Once 07/17/14 1432 07/17/14 1900   07/17/14 1230  cefTRIAXone (ROCEPHIN) 1 g in dextrose 5 % 50 mL IVPB  Status:  Discontinued     1 g 100 mL/hr over 30 Minutes Intravenous  Once 07/17/14  1219 07/17/14 1416   07/17/14 1230  azithromycin (ZITHROMAX) 500 mg in dextrose 5 % 250 mL IVPB     500 mg 250 mL/hr over 60 Minutes Intravenous  Once 07/17/14 1219 07/17/14 1550      Assessment/Plan: s/p Procedure(s): INTRAMEDULLARY (IM) NAIL INTERTROCHANTRIC LEFT HIP (Left)   Malnutrition, aspiration Tentatively plan lap G tube with Dr Honor Loh.    Will need to hold lovenox Monday and not give after around 10 AM tomorrow. Dr. Ezzard Standing will discuss final surgical plan with the patient.      LOS: 7 days    [facility] 07/24/2014

## 2014-07-25 ENCOUNTER — Encounter (HOSPITAL_COMMUNITY): Payer: Self-pay | Admitting: Certified Registered Nurse Anesthetist

## 2014-07-25 DIAGNOSIS — Z720 Tobacco use: Secondary | ICD-10-CM

## 2014-07-25 DIAGNOSIS — E46 Unspecified protein-calorie malnutrition: Secondary | ICD-10-CM

## 2014-07-25 DIAGNOSIS — J189 Pneumonia, unspecified organism: Secondary | ICD-10-CM

## 2014-07-25 DIAGNOSIS — S72142A Displaced intertrochanteric fracture of left femur, initial encounter for closed fracture: Secondary | ICD-10-CM

## 2014-07-25 LAB — MAGNESIUM: MAGNESIUM: 1.5 mg/dL — AB (ref 1.7–2.4)

## 2014-07-25 LAB — HEMOGLOBIN AND HEMATOCRIT, BLOOD
HCT: 29.1 % — ABNORMAL LOW (ref 39.0–52.0)
Hemoglobin: 10.1 g/dL — ABNORMAL LOW (ref 13.0–17.0)

## 2014-07-25 MED ORDER — ENOXAPARIN SODIUM 30 MG/0.3ML ~~LOC~~ SOLN
30.0000 mg | SUBCUTANEOUS | Status: DC
  Administered 2014-07-27: 30 mg via SUBCUTANEOUS
  Filled 2014-07-25: qty 0.3

## 2014-07-25 MED ORDER — CHLORHEXIDINE GLUCONATE 4 % EX LIQD
1.0000 "application " | Freq: Once | CUTANEOUS | Status: AC
  Administered 2014-07-26: 1 via TOPICAL
  Filled 2014-07-25: qty 15

## 2014-07-25 MED ORDER — METRONIDAZOLE IN NACL 5-0.79 MG/ML-% IV SOLN: 500.0000 mg | INTRAVENOUS | Status: DC

## 2014-07-25 MED ORDER — CHLORHEXIDINE GLUCONATE 4 % EX LIQD
1.0000 | Freq: Once | CUTANEOUS | Status: AC
Start: 2014-07-25 — End: 2014-07-25
  Administered 2014-07-25: 1 via TOPICAL
  Filled 2014-07-25: qty 15

## 2014-07-25 MED ORDER — MAGNESIUM SULFATE 2 GM/50ML IV SOLN
2.0000 g | Freq: Once | INTRAVENOUS | Status: AC
  Administered 2014-07-25: 2 g via INTRAVENOUS
  Filled 2014-07-25: qty 50

## 2014-07-25 MED ORDER — CHLORHEXIDINE GLUCONATE 4 % EX LIQD
1.0000 | Freq: Once | CUTANEOUS | Status: DC
Start: 2014-07-25 — End: 2014-07-25
  Filled 2014-07-25: qty 15

## 2014-07-25 NOTE — Progress Notes (Signed)
Patient ID: GADDIEL CULLENS, male   DOB: Aug 28, 1945, 69 y.o.   MRN: [id number] 5 Days Post-Op  Subjective: Pt sleeping.  No acute events overnight.    Objective: Vital signs in last 24 hours: Temp:  [98.2 F (36.8 C)-98.3 F (36.8 C)] 98.3 F (36.8 C) (06/12 0530) Pulse Rate:  [72-87] 76 (06/12 0530) Resp:  [20] 20 (06/12 0530) BP: (146-170)/(83-97) 153/91 mmHg (06/12 0530) SpO2:  [94 %-100 %] 100 % (06/12 0530) Last BM Date: 07/22/14  Intake/Output from previous day: 06/11 0701 - 06/12 0700 In: 562.5 [I.V.:562.5] Out: 350 [Urine:350] Intake/Output this shift:    General appearance: sleeping. Resp: breathing comfortably GI: soft, non tender, non distended.   Lab Results:   Recent Labs  07/23/14 0424  07/24/14 0930 07/25/14 0444  WBC 6.1  --  8.9  --   HGB 7.3*  < > 10.2* 10.1*  HCT 21.8*  < > 28.9* 29.1*  PLT 241  --  283  --   < > = values in this interval not displayed. BMET  Recent Labs  07/24/14 0930  NA 135  K 3.6  CL 96*  CO2 30  GLUCOSE 99  BUN 8  CREATININE 0.66  CALCIUM 8.2*   PT/INR No results for input(s): LABPROT, INR in the last 72 hours. ABG No results for input(s): PHART, HCO3 in the last 72 hours.  Invalid input(s): PCO2, PO2  Studies/Results: No results found.  Anti-infectives: Anti-infectives    Start     Dose/Rate Route Frequency Ordered Stop   07/21/14 0000  ceFAZolin (ANCEF) IVPB 2 g/50 mL premix     2 g 100 mL/hr over 30 Minutes Intravenous Every 6 hours 07/20/14 2139 07/21/14 0646   07/20/14 1000  ceFAZolin (ANCEF) IVPB 2 g/50 mL premix     2 g 100 mL/hr over 30 Minutes Intravenous  Once 07/19/14 2133 07/20/14 1833   07/19/14 1000  ampicillin-sulbactam (UNASYN) 1.5 g in sodium chloride 0.9 % 50 mL IVPB     1.5 g 100 mL/hr over 30 Minutes Intravenous Every 6 hours 07/19/14 0812     07/18/14 1400  azithromycin (ZITHROMAX) 500 mg in dextrose 5 % 250 mL IVPB  Status:  Discontinued     500 mg 250 mL/hr over 60 Minutes  Intravenous Every 24 hours 07/17/14 1612 07/23/14 0841   07/18/14 0200  ampicillin-sulbactam (UNASYN) 1.5 g in sodium chloride 0.9 % 50 mL IVPB  Status:  Discontinued     1.5 g 100 mL/hr over 30 Minutes Intravenous Every 8 hours 07/17/14 1621 07/19/14 0812   07/18/14 0000  azithromycin (ZITHROMAX) 500 mg in dextrose 5 % 250 mL IVPB  Status:  Discontinued     500 mg 250 mL/hr over 60 Minutes Intravenous Every 24 hours 07/17/14 1344 07/17/14 1612   07/17/14 1500  Ampicillin-Sulbactam (UNASYN) 3 g in sodium chloride 0.9 % 100 mL IVPB     3 g 100 mL/hr over 60 Minutes Intravenous  Once 07/17/14 1432 07/17/14 1900   07/17/14 1230  cefTRIAXone (ROCEPHIN) 1 g in dextrose 5 % 50 mL IVPB  Status:  Discontinued     1 g 100 mL/hr over 30 Minutes Intravenous  Once 07/17/14 1219 07/17/14 1416   07/17/14 1230  azithromycin (ZITHROMAX) 500 mg in dextrose 5 % 250 mL IVPB     500 mg 250 mL/hr over 60 Minutes Intravenous  Once 07/17/14 1219 07/17/14 1550      Assessment/Plan: s/p Procedure(s): INTRAMEDULLARY (IM) NAIL INTERTROCHANTRIC LEFT  HIP (Left)   Malnutrition, aspiration Tentatively plan lap G tube with Dr Honor Loh.    Will need to hold lovenox Monday and not give after around 10 AM tomorrow. Dr. Ezzard Standing will discuss final surgical plan with the patient.      LOS: 8 days    [facility] 07/25/2014

## 2014-07-25 NOTE — Progress Notes (Signed)
ANTIBIOTIC CONSULT NOTE - follow up  Pharmacy Consult for Unasyn Indication: aspiration pneumonia  No Known Allergies  Patient Measurements: Height: 5\' 7"  (170.2 cm) Weight: 105 lb 2.6 oz (47.7 kg) IBW/kg (Calculated) : 66.1  Vital Signs: Temp: 98.3 F (36.8 C) (06/12 0530) Temp Source: Oral (06/12 0530) BP: 153/91 mmHg (06/12 0530) Pulse Rate: 76 (06/12 0530) Intake/Output from previous day: 06/11 0701 - 06/12 0700 In: 562.5 [I.V.:562.5] Out: 350 [Urine:350] Intake/Output from this shift:    Labs:  Recent Labs  07/23/14 0424 07/23/14 1550 07/24/14 0930 07/25/14 0444  WBC 6.1  --  8.9  --   HGB 7.3* 12.9* 10.2* 10.1*  PLT 241  --  283  --   CREATININE  --   --  0.66  --    Estimated Creatinine Clearance: 59.6 mL/min (by C-G formula based on Cr of 0.66). No results for input(s): VANCOTROUGH, VANCOPEAK, VANCORANDOM, GENTTROUGH, GENTPEAK, GENTRANDOM, TOBRATROUGH, TOBRAPEAK, TOBRARND, AMIKACINPEAK, AMIKACINTROU, AMIKACIN in the last 72 hours.   Microbiology: Recent Results (from the past 720 hour(s))  Blood culture (routine x 2)     Status: None   Collection Time: 07/17/14  1:28 PM  Result Value Ref Range Status   Specimen Description BLOOD LEFT HAND  Final   Special Requests BOTTLES DRAWN AEROBIC ONLY 2CC  Final   Culture   Final    NO GROWTH 5 DAYS Note: Culture results may be compromised due to an inadequate volume of blood received in culture bottles. Performed at [facility]    Report Status 07/23/2014 FINAL  Final  Blood culture (routine x 2)     Status: None   Collection Time: 07/17/14  1:39 PM  Result Value Ref Range Status   Specimen Description BLOOD RIGHT HAND  Final   Special Requests BOTTLES DRAWN AEROBIC ONLY 2CC  Final   Culture   Final    NO GROWTH 5 DAYS Performed at [facility]    Report Status 07/23/2014 FINAL  Final  Urine culture     Status: None   Collection Time: 07/17/14  4:40 PM  Result Value Ref Range Status   Specimen Description URINE, CLEAN CATCH  Final   Special Requests NONE  Final   Colony Count NO GROWTH Performed at [facility]   Final   Culture NO GROWTH Performed at [facility]   Final   Report Status 07/18/2014 FINAL  Final  Culture, respiratory (NON-Expectorated)     Status: None   Collection Time: 07/17/14  5:00 PM  Result Value Ref Range Status   Specimen Description SPUTUM  Final   Special Requests NONE  Final   Gram Stain   Final    NO WBC SEEN RARE SQUAMOUS EPITHELIAL CELLS PRESENT FEW GRAM POSITIVE COCCI IN PAIRS RARE GRAM POSITIVE RODS Performed at [facility]    Culture   Final    NORMAL OROPHARYNGEAL FLORA Performed at [facility]    Report Status 07/20/2014 FINAL  Final  MRSA PCR Screening     Status: None   Collection Time: 07/22/14  4:22 PM  Result Value Ref Range Status   MRSA by PCR NEGATIVE NEGATIVE Final    Comment:        The GeneXpert MRSA Assay (FDA approved for NASAL specimens only), is one component of a comprehensive MRSA colonization surveillance program. It is not intended to diagnose MRSA infection nor to guide or monitor treatment for MRSA infections.  Medical History: Past Medical History  Diagnosis Date  . Inguinal hernia   . COPD (chronic obstructive pulmonary disease)   . GERD (gastroesophageal reflux disease)   . Chronic hoarseness   . Tobacco abuse   . Cervical spinal stenosis     C4-C5; C5-C6; severe stenosis and abnormal  cord signal C6-C7 s/p decompression in 04/2004    Assessment: 69 y.o. male with PMH COPD presented  07/17/2014 after being found on floor w/o LOC.  Endorses progressive dysphagia x several years; thinks he aspirated on food about 1 wk ago and since developed productive cough with SOB.  Admitting for CAP/aspiration PNA.  Pharmacy consulted to assist with dosing of Unasyn. Patient also on Azithromycin per MD.   6/4 >> Ceftriaxone x 1 6/4 >> Azithromycin (MD) >>   6/4  >> Unasyn >>  Tmax: afebrile WBCs: WNL Renal: SCr improved to WNL SCr, CrCl ~ 60 ml/min CG  6/4 blood x 2: NGTD 6/4 urine: NGF 6/4 sputum: normal oropharyngeal flora 6/4 S. pneumo UAg: neg 6/4 Legionella UAg: neg   Goal of Therapy:  Eradication of infection Appropriate antibiotic dosing for indication and renal function  Plan: D9 Antibiotics  Continue Unasyn to 1.5g IV q6h - note plan to stop tomorrow  Continue to monitor renal function, cultures, length of therapy, clinical course.  Haynes Hoehn, PharmD, BCPS 07/25/2014, 1:48 PM  Pager: [phone]

## 2014-07-25 NOTE — Progress Notes (Signed)
PATIENT DETAILS Name: Phillip May Age: 69 y.o. Sex: male Date of Birth: 04-Sep-1945 Admit Date: 07/17/2014 Admitting Physician Leroy Sea, MD ZOX:[id number] DAVIDSON, MD  Brief narrative  69 year old male with history of COPD, EtOH use, tobacco abuse, chronic dysphagia, GERD who lives at home by himself with the help of neighbors was found on the floor on the day of admission and brought to the emergency room. Found to have acute on chronic respiratory failure likely secondary to presumed aspiration pneumonia, patient was subsequently admitted to the hospital. Hospital course has been complicated by development of delirium which resulted in a fall causing a left hip fracture. During this hospital stay, patient was seen by speech therapy and found to have significant dysphagia and was kept nothing by mouth. Unfortunately Dysphagia has not improved, after discussion with patient, PEG tube placement was planned, unfortunately because of the anatomy this was not able to be done by interventional radiology, general surgery currently planning on PEG tube placement on 6/13.   Subjective: Doing well, claims that pain in his left hip area is currently controlled.  Assessment/Plan: Principal Problem: Presumed aspiration pneumonia: Continue Unasyn-Will discontinue on 6/13. Remains afebrile and nontoxic looking.  Active Problems: Acute on chronic respiratory failure: Secondary to above. Continue nothing by mouth-await placement of PEG tube. Continue Unasyn for now.  Left hip fracture: Occurred on 6/7 after sustaining a mechanical fall while delirious in the hospital. Orthopedics consulted, underwent ORIF on 07/20/2014. On Lovenox for DVT prophylaxis. Hospital course was complicated by development of perioperative blood loss anemia requiring 1 unit of PRBC on 6/10. Hemoglobin currently stable.  Chronic dysphagia: See above-underwent SLP evaluation recommendations were nothing  by mouth.Peg tube placement  Was requested by IR , IR failed on 07/22/2014 due to overlying small bowel, consulted general surgery on 07/23/2014, due for PEG tube placement 6/13. Patient understands the risks and benefits, family as well.  Hypertension: Controlled with Bystolic.  Sepsis: Secondary to pneumonia. Has resolved with IV fluids and antibiotics.  History of COPD: stable with clear lungs. No current bronchodilators regimen.  Tobacco abuse: Counseled extensively.  Alcohol abuse with withdrawal: No signs of withdrawal currently. He remains on Ativan per protocol.  Anemia: Multifactorial-suspect has anemia of chronic disease at baseline, worsened by perioperative blood loss following left hip repair. Hemoglobin currently stable. Monitor periodically.  Severe Protein-calorie malnutrition: Continue supplements  Disposition: Remain inpatient-SNF once PEG tube is placed  Antimicrobial agents  See below  Anti-infectives    Start     Dose/Rate Route Frequency Ordered Stop   07/26/14 0000  metroNIDAZOLE (FLAGYL) IVPB 500 mg    Comments:  Pharmacy may adjust dosing strength, interval, or rate of medication as needed for optimal therapy for the patient Send with patient on call to the OR.  Anesthesia to complete antibiotic administration <72min prior to incision per [facility]   500 mg 100 mL/hr over 60 Minutes Intravenous 30 min pre-op 07/25/14 1043     07/21/14 0000  ceFAZolin (ANCEF) IVPB 2 g/50 mL premix     2 g 100 mL/hr over 30 Minutes Intravenous Every 6 hours 07/20/14 2139 07/21/14 0646   07/20/14 1000  ceFAZolin (ANCEF) IVPB 2 g/50 mL premix     2 g 100 mL/hr over 30 Minutes Intravenous  Once 07/19/14 2133 07/20/14 1833   07/19/14 1000  ampicillin-sulbactam (UNASYN) 1.5 g in sodium chloride 0.9 % 50 mL IVPB  1.5 g 100 mL/hr over 30 Minutes Intravenous Every 6 hours 07/19/14 0812     07/18/14 1400  azithromycin (ZITHROMAX) 500 mg in dextrose 5 % 250 mL IVPB  Status:   Discontinued     500 mg 250 mL/hr over 60 Minutes Intravenous Every 24 hours 07/17/14 1612 07/23/14 0841   07/18/14 0200  ampicillin-sulbactam (UNASYN) 1.5 g in sodium chloride 0.9 % 50 mL IVPB  Status:  Discontinued     1.5 g 100 mL/hr over 30 Minutes Intravenous Every 8 hours 07/17/14 1621 07/19/14 0812   07/18/14 0000  azithromycin (ZITHROMAX) 500 mg in dextrose 5 % 250 mL IVPB  Status:  Discontinued     500 mg 250 mL/hr over 60 Minutes Intravenous Every 24 hours 07/17/14 1344 07/17/14 1612   07/17/14 1500  Ampicillin-Sulbactam (UNASYN) 3 g in sodium chloride 0.9 % 100 mL IVPB     3 g 100 mL/hr over 60 Minutes Intravenous  Once 07/17/14 1432 07/17/14 1900   07/17/14 1230  cefTRIAXone (ROCEPHIN) 1 g in dextrose 5 % 50 mL IVPB  Status:  Discontinued     1 g 100 mL/hr over 30 Minutes Intravenous  Once 07/17/14 1219 07/17/14 1416   07/17/14 1230  azithromycin (ZITHROMAX) 500 mg in dextrose 5 % 250 mL IVPB     500 mg 250 mL/hr over 60 Minutes Intravenous  Once 07/17/14 1219 07/17/14 1550      DVT Prophylaxis: Prophylactic Lovenox   Code Status: Full code   Family Communication None at bedside  Procedures: L ORIF by Dr. Linna Caprice on 07/20/2014  PEG attempted by IR failed on 07-22-14  CONSULTS:  orthopedic surgery  IR  Time spent 30 minutes-Greater than 50% of this time was spent in counseling, explanation of diagnosis, planning of further management, and coordination of care.  MEDICATIONS: Scheduled Meds: . ampicillin-sulbactam (UNASYN) IV  1.5 g Intravenous Q6H  . antiseptic oral rinse  7 mL Mouth Rinse q12n4p  . budesonide-formoterol  2 puff Inhalation BID  . chlorhexidine  1 application Topical Once  . [START ON 07/26/2014] chlorhexidine  1 application Topical Once  . chlorhexidine  15 mL Mouth Rinse BID  . [START ON 07/27/2014] enoxaparin (LOVENOX) injection  30 mg Subcutaneous Q24H  . feeding supplement (ENSURE ENLIVE)  237 mL Oral BID BM  . folic acid  1 mg Oral  Daily  . levalbuterol  1.25 mg Nebulization TID  . [START ON 07/26/2014] metronidazole  500 mg Intravenous 30 min Pre-Op  . multivitamin with minerals  1 tablet Oral Daily  . nebivolol  5 mg Oral Daily  . thiamine  100 mg Oral Daily  . tiotropium  18 mcg Inhalation Daily   Continuous Infusions: . dextrose 5 % and 0.45 % NaCl with KCl 10 mEq/L 75 mL/hr at 07/24/14 2329   PRN Meds:.acetaminophen **OR** [DISCONTINUED] acetaminophen, albuterol, diphenhydrAMINE, guaiFENesin, haloperidol lactate, hydrALAZINE, HYDROcodone-acetaminophen, LORazepam **OR** LORazepam, metoprolol, morphine injection, [DISCONTINUED] ondansetron **OR** ondansetron (ZOFRAN) IV    PHYSICAL EXAM: Vital signs in last 24 hours: Filed Vitals:   07/24/14 2120 07/24/14 2330 07/25/14 0530 07/25/14 0911  BP: 165/97 170/92 153/91   Pulse: 80 79 76   Temp: 98.3 F (36.8 C)  98.3 F (36.8 C)   TempSrc: Oral  Oral   Resp: 20  20   Height:      Weight:      SpO2: 96%  100% 98%    Weight change:  Filed Weights   07/17/14 1500 07/23/14 1312  Weight: 44.3 kg (97 lb 10.6 oz) 47.7 kg (105 lb 2.6 oz)   Body mass index is 16.47 kg/(m^2).   Gen Exam: Awake and alert with clear speech.  Neck: Supple, No JVD.  Chest: B/L Clear.  CVS: S1 S2 Regular, no murmurs.  Abdomen: soft, BS +, non tender, non distended. Extremities: no edema, lower extremities warm to touch. Neurologic: Non Focal.   Skin: No Rash.  Wounds: N/A.   Intake/Output from previous day:  Intake/Output Summary (Last 24 hours) at 07/25/14 1152 Last data filed at 07/24/14 1757  Gross per 24 hour  Intake  562.5 ml  Output    100 ml  Net  462.5 ml     LAB RESULTS: CBC  Recent Labs Lab 07/20/14 0525 07/21/14 0725 07/22/14 0345 07/23/14 0424 07/23/14 1550 07/24/14 0930 07/25/14 0444  WBC 7.9 7.9 7.8 6.1  --  8.9  --   HGB 10.4* 9.0* 7.8* 7.3* 12.9* 10.2* 10.1*  HCT 30.5* 26.8* 23.5* 21.8* 37.8* 28.9* 29.1*  PLT 275 244 196 241  --  283  --     MCV 99.0 99.6 99.2 100.5*  --  92.6  --   MCH 33.8 33.5 32.9 33.6  --  32.7  --   MCHC 34.1 33.6 33.2 33.5  --  35.3  --   RDW 13.0 13.0 13.0 12.7  --  14.0  --     Chemistries   Recent Labs Lab 07/19/14 0400 07/20/14 0525 07/21/14 0725 07/22/14 0345 07/24/14 0930 07/25/14 0444  NA  --  137 137 138 135  --   K 3.7 3.8 3.6 3.5 3.6  --   CL  --  101 100* 104 96*  --   CO2  --  24 26 27 30   --   GLUCOSE  --  109* 91 89 99  --   BUN  --  13 12 12 8   --   CREATININE  --  0.95 0.88 0.87 0.66  --   CALCIUM  --  8.3* 8.1* 7.7* 8.2*  --   MG 1.0*  --  1.3* 1.6* 1.3* 1.5*    CBG:  Recent Labs Lab 07/23/14 0319  GLUCAP 75    GFR Estimated Creatinine Clearance: 59.6 mL/min (by C-G formula based on Cr of 0.66).  Coagulation profile  Recent Labs Lab 07/19/14 1906  INR 1.12    Cardiac Enzymes No results for input(s): CKMB, TROPONINI, MYOGLOBIN in the last 168 hours.  Invalid input(s): CK  Invalid input(s): POCBNP No results for input(s): DDIMER in the last 72 hours. No results for input(s): HGBA1C in the last 72 hours. No results for input(s): CHOL, HDL, LDLCALC, TRIG, CHOLHDL, LDLDIRECT in the last 72 hours. No results for input(s): TSH, T4TOTAL, T3FREE, THYROIDAB in the last 72 hours.  Invalid input(s): FREET3  Recent Labs  07/23/14 1010  VITAMINB12 665  FOLATE 9.6  FERRITIN 313  TIBC 151*  IRON 27*  RETICCTPCT 0.8   No results for input(s): LIPASE, AMYLASE in the last 72 hours.  Urine Studies No results for input(s): UHGB, CRYS in the last 72 hours.  Invalid input(s): UACOL, UAPR, USPG, UPH, UTP, UGL, UKET, UBIL, UNIT, UROB, ULEU, UEPI, UWBC, URBC, UBAC, CAST, UCOM, BILUA  MICROBIOLOGY: Recent Results (from the past 240 hour(s))  Blood culture (routine x 2)     Status: None   Collection Time: 07/17/14  1:28 PM  Result Value Ref Range Status   Specimen Description BLOOD LEFT HAND  Final   Special Requests BOTTLES DRAWN AEROBIC ONLY 2CC  Final    Culture   Final    NO GROWTH 5 DAYS Note: Culture results may be compromised due to an inadequate volume of blood received in culture bottles. Performed at [facility]    Report Status 07/23/2014 FINAL  Final  Blood culture (routine x 2)     Status: None   Collection Time: 07/17/14  1:39 PM  Result Value Ref Range Status   Specimen Description BLOOD RIGHT HAND  Final   Special Requests BOTTLES DRAWN AEROBIC ONLY 2CC  Final   Culture   Final    NO GROWTH 5 DAYS Performed at [facility]    Report Status 07/23/2014 FINAL  Final  Urine culture     Status: None   Collection Time: 07/17/14  4:40 PM  Result Value Ref Range Status   Specimen Description URINE, CLEAN CATCH  Final   Special Requests NONE  Final   Colony Count NO GROWTH Performed at [facility]   Final   Culture NO GROWTH Performed at [facility]   Final   Report Status 07/18/2014 FINAL  Final  Culture, respiratory (NON-Expectorated)     Status: None   Collection Time: 07/17/14  5:00 PM  Result Value Ref Range Status   Specimen Description SPUTUM  Final   Special Requests NONE  Final   Gram Stain   Final    NO WBC SEEN RARE SQUAMOUS EPITHELIAL CELLS PRESENT FEW GRAM POSITIVE COCCI IN PAIRS RARE GRAM POSITIVE RODS Performed at [facility]    Culture   Final    NORMAL OROPHARYNGEAL FLORA Performed at [facility]    Report Status 07/20/2014 FINAL  Final  MRSA PCR Screening     Status: None   Collection Time: 07/22/14  4:22 PM  Result Value Ref Range Status   MRSA by PCR NEGATIVE NEGATIVE Final    Comment:        The GeneXpert MRSA Assay (FDA approved for NASAL specimens only), is one component of a comprehensive MRSA colonization surveillance program. It is not intended to diagnose MRSA infection nor to guide or monitor treatment for MRSA infections.     RADIOLOGY STUDIES/RESULTS: Ct Abdomen Wo Contrast  07/21/2014   CLINICAL DATA:  69 year old  male with generalized weakness. Evaluate anatomy prior to gastrostomy tube placement. History of dysphagia.  EXAM: CT ABDOMEN WITHOUT CONTRAST  TECHNIQUE: Multidetector CT imaging of the abdomen was performed following the standard protocol without IV contrast.  COMPARISON:  No priors.  FINDINGS: Lower chest: Areas of airspace consolidation in the right middle lobe and right lower lobe. Small right pleural effusion layering dependently. Areas of bronchiectasis and potential cavitation in the posterior aspect of the right lower lobe, poorly evaluated on today's noncontrast CT examination. 1.6 x 1.0 cm nodular density in the medial aspect of the left lower lobe (image 13 of series 5). Trace left pleural effusion. Atherosclerotic calcifications in the left anterior descending and right coronary arteries.  Hepatobiliary: No definite cystic or solid hepatic lesions are identified on today's noncontrast CT examination. Gallbladder is not confidently identified and could be collapsed or surgically absent (no surgical clips are noted in the region of the gallbladder fossa).  Pancreas: Pancreas is poorly depicted, but grossly unremarkable.  Spleen: Unremarkable.  Adrenals/Urinary Tract: The unenhanced appearance of the kidneys and adrenal glands is normal bilaterally. No hydroureteronephrosis.  Stomach/Bowel: The unenhanced appearance of the visualized  portions of the stomach is unremarkable. Importantly, however, there are loops of small bowel and colon superficial to the visualized portions of the stomach. No pathologic dilatation of the visualized portions of the small bowel or colon.  Vascular/Lymphatic: Atherosclerosis throughout the visualized abdominal vasculature, without definite aneurysm. No definite lymphadenopathy in the visualized portions of the abdomen on today's noncontrast CT examination.  Other: No significant volume of ascites or definite pneumoperitoneum identified in the abdomen.  Musculoskeletal:  Chronic appearing compression fracture of L1 with approximately 20% loss of anterior vertebral body height. There are no aggressive appearing lytic or blastic lesions noted in the visualized portions of the skeleton.  IMPRESSION: 1. Stomach is incompletely visualized on today's examination. The visualized portions of the stomach are remarkable for superficial loops of small bowel and colon interposed between the stomach in the anterior abdominal wall. 2. Airspace consolidation in the right middle and lower lobes concerning for acute infection. This appears be associated with some areas of bronchiectasis and potential cavitation in the posterior aspect of the right lower lobe. There is also a small right parapneumonic pleural effusion. 3. Pleural-based 1.6 x 1.0 cm nodular density in the medial aspect of the left lower lobe. This could also be infectious or inflammatory in etiology, however, the possibility of neoplasm is not excluded, and close attention on future followup examinations is recommended. There is also trace left pleural effusion. At this time, noncontrast chest CT is recommended in 2-3 weeks to assess for resolution of the right lower/middle lobe pneumonia following trial of antimicrobial therapy, and to reassess this left lower lobe nodular region. 4. Extensive atherosclerosis, including at least 2 vessel coronary artery disease. Please note that although the presence of coronary artery calcium documents the presence of coronary artery disease, the severity of this disease and any potential stenosis cannot be assessed on this non-gated CT examination. Assessment for potential risk factor modification, dietary therapy or pharmacologic therapy may be warranted, if clinically indicated. 5. Additional incidental findings, as above.   Electronically Signed   By: Trudie Reed M.D.   On: 07/21/2014 20:26   Ct Head Wo Contrast  07/19/2014   CLINICAL DATA:  Headache, hallucinating objects  EXAM: CT HEAD  WITHOUT CONTRAST  TECHNIQUE: Contiguous axial images were obtained from the base of the skull through the vertex without intravenous contrast.  COMPARISON:  None  FINDINGS: There is no evidence of mass effect, midline shift, or extra-axial fluid collections. There is no evidence of a space-occupying lesion or intracranial hemorrhage. There is no evidence of a cortical-based area of acute infarction. 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 There is generalized cerebral atrophy. There is periventricular white matter low attenuation likely secondary to microangiopathy.  The ventricles and sulci are appropriate for the patient's age. The basal cisterns are patent.  Visualized portions of the orbits are unremarkable. There is near complete opacification of the left maxillary sinus. There is an air-fluid level in the right sphenoid sinus. There is mild left ethmoid sinus mucosal thickening. Cerebrovascular atherosclerotic calcifications are noted.  The osseous structures are unremarkable.  IMPRESSION: 1. No acute intracranial pathology. 2. Chronic microvascular disease and cerebral atrophy. 3. Right sphenoid and left maxillary sinus disease. Mild left ethmoid sinus disease.   Electronically Signed   By: Elige Ko   On: 07/19/2014 18:01   Pelvis Portable  07/20/2014   CLINICAL DATA:  69 year old status post left hip repair  EXAM: PORTABLE PELVIS 1-2 VIEWS  COMPARISON:  Preoperative radiographs  07/19/2014  FINDINGS: Interval ORIF of intertrochanteric left femoral fracture with intra medullary nail and a transfemoral neck gamma nail. Improved alignment of the fracture fragments. No evidence of acute hardware complication. Expected postoperative subcutaneous emphysema. Oral contrast material opacifies the colon. Scattered atherosclerotic vascular calcifications are noted.  IMPRESSION: ORIF left intertrochanteric femoral fracture without evidence of complication.   Electronically Signed   By: Malachy Moan  M.D.   On: 07/20/2014 21:15   Ir Fluoro Rm 30-60 Min  07/23/2014   CLINICAL DATA:  Dysphagia, aspiration pneumonia. Request for enteral feeding support. Recent CT had demonstrated no safe percutaneous approach to the nondistended stomach due to overlying small bowel and colon.  EXAM: IR FLOURO RM 0-60 MIN  COMPARISON:  CT 07/21/2014  TECHNIQUE: Patient was positioned prone on the supine on the procedure table.  Intravenous Fentanyl and Versed were administered as conscious sedation during continuous cardiorespiratory monitoring by the radiology RN, with a total moderate sedation time of less than 30 minutes.  A 5 French angled angiographic catheter placed as orogastric tube to allow gastric insufflation with air. Under fluoroscopy, using multiple obliquities, it was apparent that the redundant transverse colon and loops of small bowel continued overlie the gastric body and antrum, such that no safe percutaneous approach was available for gastrostomy tube placement.  IMPRESSION: 1. No safe percutaneous approach for gastrostomy tube placement due to overlying bowel. Consider surgical placement if needed.   Electronically Signed   By: Corlis Leak M.D.   On: 07/23/2014 08:10   Dg Chest Port 1 View  07/22/2014   CLINICAL DATA:  Right lower lobe infiltrate. Increasing shortness of breath and chest congestion.  EXAM: PORTABLE CHEST - 1 VIEW  COMPARISON:  07/17/2014  FINDINGS: Slight improvement in the pneumonia in the right lower lobe. New accentuation of the interstitial markings at the left lung base. Probable small bilateral pleural effusions superimposed on emphysema. Tortuosity of the thoracic aorta. Overall heart size is normal.  IMPRESSION: Slight improvement in right lower lobe pneumonia. Interstitial accentuation at the left lung base could represent pneumonitis. Is the patient aspirating? COPD. Probable small effusions.   Electronically Signed   By: Francene Boyers M.D.   On: 07/22/2014 07:40   Dg Chest Port  1 View  07/17/2014   CLINICAL DATA:  Pt found on kitchen floor. Believed to be there for approx 4 hrs. Sts he fell but sat himself down, wasn't a "crash." Complains of sob and congestion x3 days. Was too weak to stand up from fall. Has been taking mucinex. Per neighbor, he started coughing up blood clots yesterday.  EXAM: PORTABLE CHEST - 1 VIEW  COMPARISON:  04/27/2004  FINDINGS: There is dense consolidation in knee right lower lung, projecting in the right lower lobe. This is new from the prior study.  Lungs are hyperexpanded consistent with underlying COPD. No other lung consolidation. No pulmonary edema. No pleural effusion or pneumothorax.  Cardiac silhouette is normal in size. Aorta is uncoiled. No mediastinal or hilar masses or convincing adenopathy.  Bony thorax is demineralized but intact.  IMPRESSION: Right lower lobe consolidation consistent with pneumonia. Underlying COPD.   Electronically Signed   By: Amie Portland M.D.   On: 07/17/2014 11:58   Dg Knee Left Port  07/19/2014   CLINICAL DATA:  Status post fall.  EXAM: PORTABLE LEFT KNEE - 1-2 VIEW  COMPARISON:  None.  FINDINGS: There is no evidence of fracture, dislocation, or joint effusion. There is generalized osteopenia. There is  no evidence of arthropathy or other focal bone abnormality. Soft tissues are unremarkable. There is peripheral vascular atherosclerotic disease.  IMPRESSION: No acute osseous injury of the left knee.   Electronically Signed   By: Elige Ko   On: 07/19/2014 20:25   Dg Swallowing Func-speech Pathology  07/19/2014    Objective Swallowing Evaluation:    Patient Details  Name: Phillip May MRN: [id number] Date of Birth: 04-07-1945  Today's Date: 07/19/2014 Time: SLP Start Time (ACUTE ONLY): 1120-SLP Stop Time (ACUTE ONLY): 1155 SLP Time Calculation (min) (ACUTE ONLY): 35 min  Past Medical History:  Past Medical History  Diagnosis Date  . Inguinal hernia   . COPD (chronic obstructive pulmonary disease)   . GERD  (gastroesophageal reflux disease)   . Chronic hoarseness   . Tobacco abuse   . Cervical spinal stenosis     C4-C5; C5-C6; severe stenosis and abnormal  cord signal C6-C7 s/p  decompression in 04/2004   Past Surgical History:  Past Surgical History  Procedure Laterality Date  . Inguinal hernia repair    . Posterior laminectomy / decompression cervical spine  2006   HPI:  Other Pertinent Information: Daeson Tehrani is a 69 y.o. male, history of  COPD (not on oxygen), ongoing smoking, occasional alcohol use, history of  dysphagia on a soft diet per MBS in September 2014, chronic hoarse voice,  GERD, generalized weakness and deconditioning.  PSH + for cervical  decompression surgery in 2006.  Pt reported chronic problems with  hoarseness and dysphagia s/p cervical surgery.  He also experienced 30  pound weight loss within 3 years-pt reported in 2014.  He lives at home  alone and receives help from neighbors for getting groceries and  activities of daily living.  The MBS in September 2014 did show trace  aspiration of thins and penetration of nectar liquids.  CXR showed right  lower lobe pna.    No Data Recorded  Assessment / Plan / Recommendation CHL IP CLINICAL IMPRESSIONS 07/19/2014  Therapy Diagnosis Severe pharyngeal phase dysphagia;Severe cervical  esophageal phase dysphagia;Moderate pharyngeal phase dysphagia;Moderate  cervical esophageal phase dysphagia  Clinical Impression Known chronic dysphagia with suspected acute  exacerbation.  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    Weakness in pharyngeal contraction and tongue base retraction/poor  epiglottic deflection results in gross pharyngeal residuals that mix with  secretions without adequate pt sensation.  With liquids, pt conducts  multiple reflexive swallows (x4 - with liquids) with extended breathhold  *presumed to be compensatory to help clear and prevent gross aspiration.   Poor hyolaryngeal  elevation allows laryngeal penetration of liquids during  the swallow.  Liquid swallows helpful to reduce solid food residuals in  pharynx.    Mild amount of aspiration (silent) after the swallow noted as barium  spills posterior into open larynx from pyriform sinus.   Pt did not sense  residuals today (mixed with secretions) but cued swallow, liquid follow  solids and cued expectoration (of secretions mixed with barium) effective  to clear 80%.    Per previous MBS in 2014 *Dr Bradly Chris, radiologist stated findings  consistent with esophagram from 08/2012 indicating narrowing of lower  cervical esophagus and dilated cervical esophagus.  SLP today noted area  near cervical hardware where barium pools slightly, clears with reflexive  swallows.   Mr Belville again reports desire to eat with known aspiration/malnutrition  risks.  Pt does admit to further weight loss of 5  pounds since 2014 and  states he has "good days and bad days" re: swallowing.  SLP  advised him  to monitor swallowing/nutrition closely to maximize  hydration/nutrition/airway safety.  Pt readily states liquid are easier  for him to consume.    To respect pt wishes, recommend dys3/thin with precautions.  Pt will  require crushed or liquid medications due to level of dysphagia.  Using  live video, educated pt to findings, effective compensations.  Also  recommend pt have suction set up to help with his secretion management.           No flowsheet data found.   [facility] IP DIET RECOMMENDATION 07/19/2014  SLP Diet Recommendations Dysphagia 3 (Mech soft);Thin  Liquid Administration via cup  Medication Administration Crushed with puree  Compensations Slow rate;Small sips/bites;Follow solids with  liquid;Multiple dry swallows after each bite/sip, cough and expectorate  Postural Changes and/or Swallow Maneuvers (None)     [facility] IP OTHER RECOMMENDATIONS 07/19/2014  Recommended Consults (None)  Oral Care Recommendations Oral care QID  Other Recommendations (None)     [facility] IP  FOLLOW UP RECOMMENDATIONS 10/28/2012  Follow up Recommendations None     [facility] IP FREQUENCY AND DURATION 07/19/2014  Speech Therapy Frequency (ACUTE ONLY) min 2x/week  Treatment Duration 1 week         [facility] IP REASON FOR REFERRAL 07/19/2014  Reason for Referral Objectively evaluate swallowing function     [facility] IP ORAL PHASE 07/19/2014                    Oral Phase WFL                     [facility] IP PHARYNGEAL PHASE 07/19/2014  Pharyngeal Phase Impaired  Pharyngeal Comment multiple swallows across consistencies, following solid  with liquids helpful to decrease vallecular residuals, cough and  expectoration helpful, limited postural changes available due to cervical  decompression sx in 06      Donavan Burnet, MS [facility] SLP [phone]    Dg C-arm 1-60 Min  07/20/2014   CLINICAL DATA:  69 year old male with left intertrochanteric femoral fracture undergoing ORIF  EXAM: DG C-ARM 61-120 MIN; LEFT FEMUR 2 VIEWS  COMPARISON:  Preoperative radiographs 07/19/2014  FINDINGS: For intraoperative spot radiographs demonstrate open reduction internal fixation of intertrochanteric femoral fracture with an intra medullary femoral nail and transfemoral neck gamma nail. No evidence of immediate hardware complication. There is a single distal interlocking screw.  IMPRESSION: ORIF of left intertrochanteric femoral fracture without evidence of immediate complication   Electronically Signed   By: Malachy Moan M.D.   On: 07/20/2014 20:39   Dg Hip Unilat With Pelvis 2-3 Views Left  07/19/2014   CLINICAL DATA:  Fall today with left hip pain, initial encounter  EXAM: LEFT HIP (WITH PELVIS) 2-3 VIEWS  COMPARISON:  None.  FINDINGS: There is an a minimally displaced intratrochanteric fracture of the proximal left femur. No dislocation is noted. Contrast material is seen from prior speech pathology examination. The pelvic ring is otherwise intact. No other focal abnormality is seen.  IMPRESSION: Mildly displaced left intratrochanteric femoral fracture.    Electronically Signed   By: Alcide Clever M.D.   On: 07/19/2014 17:34   Dg Femur Min 2 Views Left  07/20/2014   CLINICAL DATA:  Postop left hip fracture repair  EXAM: LEFT FEMUR 2 VIEWS  COMPARISON:  07/19/2014  FINDINGS: Internal fixation across the left intertrochanteric femoral fracture. Near anatomic alignment. No  hardware or bony complicating feature.  IMPRESSION: Internal fixation across the left femoral intertrochanteric fracture. No complicating feature.   Electronically Signed   By: Charlett Nose M.D.   On: 07/20/2014 21:20   Dg Femur Min 2 Views Left  07/20/2014   CLINICAL DATA:  69 year old male with left intertrochanteric femoral fracture undergoing ORIF  EXAM: DG C-ARM 61-120 MIN; LEFT FEMUR 2 VIEWS  COMPARISON:  Preoperative radiographs 07/19/2014  FINDINGS: For intraoperative spot radiographs demonstrate open reduction internal fixation of intertrochanteric femoral fracture with an intra medullary femoral nail and transfemoral neck gamma nail. No evidence of immediate hardware complication. There is a single distal interlocking screw.  IMPRESSION: ORIF of left intertrochanteric femoral fracture without evidence of immediate complication   Electronically Signed   By: Malachy Moan M.D.   On: 07/20/2014 20:39    Jeoffrey Massed, MD  [facility] Pager:[phone]  If 7PM-7AM, please contact night-coverage [email or web address] Password [facility] 07/25/2014, 11:52 AM   LOS: 8 days

## 2014-07-26 ENCOUNTER — Encounter (HOSPITAL_COMMUNITY): Payer: Self-pay

## 2014-07-26 ENCOUNTER — Encounter (HOSPITAL_COMMUNITY)
Admission: EM | Disposition: A | Discharge status: Skilled Nursing Facility | Payer: Self-pay | Source: Home / Self Care | Attending: Internal Medicine

## 2014-07-26 DIAGNOSIS — J449 Chronic obstructive pulmonary disease, unspecified: Secondary | ICD-10-CM

## 2014-07-26 DIAGNOSIS — R1310 Dysphagia, unspecified: Secondary | ICD-10-CM

## 2014-07-26 LAB — CBC
HCT: 31 % — ABNORMAL LOW (ref 39.0–52.0)
Hemoglobin: 10.5 g/dL — ABNORMAL LOW (ref 13.0–17.0)
MCH: 32 pg (ref 26.0–34.0)
MCHC: 33.9 g/dL (ref 30.0–36.0)
MCV: 94.5 fL (ref 78.0–100.0)
Platelets: 336 10*3/uL (ref 150–400)
RBC: 3.28 MIL/uL — AB (ref 4.22–5.81)
RDW: 13 % (ref 11.5–15.5)
WBC: 7.7 10*3/uL (ref 4.0–10.5)

## 2014-07-26 LAB — TYPE AND SCREEN
ABO/RH(D): B POS
ANTIBODY SCREEN: NEGATIVE
UNIT DIVISION: 0

## 2014-07-26 SURGERY — CREATION, GASTROSTOMY, LAPAROSCOPIC
Anesthesia: General

## 2014-07-26 MED ORDER — LACTATED RINGERS IV SOLN
INTRAVENOUS | Status: DC
  Administered 2014-07-26: 1000 mL via INTRAVENOUS

## 2014-07-26 MED ORDER — AMLODIPINE BESYLATE 5 MG PO TABS
5.0000 mg | ORAL_TABLET | Freq: Every day | ORAL | Status: DC
  Administered 2014-07-26 – 2014-07-27 (×2): 5 mg via ORAL
  Filled 2014-07-26 (×2): qty 1

## 2014-07-26 NOTE — Progress Notes (Signed)
CSW continuing to follow.   Pt and pt family plan for [facility] when medically ready for discharge.   CSW reviewed chart and noted that pt was planned for PEG tube placement today however, per chart review pt and pt family would like to delay surgery until they have had a chance to discuss the situation further.   CSW notified [facility] and sent facility updated clinical information.   CSW to continue to follow pt progress and assist with pt disposition to [facility] when pt medically ready for discharge.   Loletta Specter, MSW, LCSW Clinical Social Work [phone]

## 2014-07-26 NOTE — Progress Notes (Signed)
PATIENT DETAILS Name: Phillip May Age: 69 y.o. Sex: male Date of Birth: 07/14/1945 Admit Date: 07/17/2014 Admitting Physician Leroy Sea, MD ZOX:[id number] DAVIDSON, MD  Brief narrative  69 year old male with history of COPD, EtOH use, tobacco abuse, chronic dysphagia, GERD who lives at home by himself with the help of neighbors was found on the floor on the day of admission and brought to the emergency room. Found to have acute on chronic respiratory failure likely secondary to presumed aspiration pneumonia, patient was subsequently admitted to the hospital. Hospital course has been complicated by development of delirium which resulted in a fall causing a left hip fracture. During this hospital stay, patient was seen by speech therapy and found to have significant dysphagia and was kept nothing by mouth. Unfortunately Dysphagia has not improved, after discussion with patient, PEG tube placement was planned, unfortunately because of the anatomy this was not able to be done by interventional radiology, general surgery currently planning on PEG tube placement on 6/13.  Subjective: Anxious about peg tube placement.  Assessment/Plan: Principal Problem: Presumed aspiration pneumonia: Has completed a course of Unasyn-Will discontinue on 6/13. Remains afebrile and nontoxic looking.  Active Problems: Acute on chronic respiratory failure: Secondary to above. Continue nothing by mouth-await placement of PEG tube.   Left hip fracture: Occurred on 6/7 after sustaining a mechanical fall while delirious in the hospital. Orthopedics consulted, underwent ORIF on 07/20/2014. On Lovenox for DVT prophylaxis. Hospital course was complicated by development of perioperative blood loss anemia requiring 1 unit of PRBC on 6/10. Hemoglobin currently stable.  Chronic dysphagia: See above-underwent SLP evaluation recommendations were NPO.Peg tube placement  Was requested by IR , IR failed on  07/22/2014 due to overlying small bowel, consulted general surgery on 07/23/2014, due for PEG tube placement 6/13. Patient understands the risks and benefits, family as well.  Hypertension: Uncontrolled-add Amlodipine, continue Bystolic.Follow BP trend  Sepsis: Secondary to pneumonia. Has resolved with IV fluids and antibiotics.  History of COPD: stable with clear lungs. No current bronchodilators regimen.  Tobacco abuse: Counseled extensively.  Alcohol abuse with withdrawal: No signs of withdrawal currently. He remains on Ativan per protocol.  Anemia: Multifactorial-suspect has anemia of chronic disease at baseline, worsened by perioperative blood loss following left hip repair. Hemoglobin currently stable. Monitor periodically.  Severe Protein-calorie malnutrition: Continue supplements  Disposition: Remain inpatient-SNF once PEG tube is placed  Antimicrobial agents  See below  Anti-infectives    Start     Dose/Rate Route Frequency Ordered Stop   07/26/14 0000  metroNIDAZOLE (FLAGYL) IVPB 500 mg    Comments:  Pharmacy may adjust dosing strength, interval, or rate of medication as needed for optimal therapy for the patient Send with patient on call to the OR.  Anesthesia to complete antibiotic administration <81min prior to incision per [facility].   500 mg 100 mL/hr over 60 Minutes Intravenous 30 min pre-op 07/25/14 1043     07/21/14 0000  ceFAZolin (ANCEF) IVPB 2 g/50 mL premix     2 g 100 mL/hr over 30 Minutes Intravenous Every 6 hours 07/20/14 2139 07/21/14 0646   07/20/14 1000  ceFAZolin (ANCEF) IVPB 2 g/50 mL premix     2 g 100 mL/hr over 30 Minutes Intravenous  Once 07/19/14 2133 07/20/14 1833   07/19/14 1000  ampicillin-sulbactam (UNASYN) 1.5 g in sodium chloride 0.9 % 50 mL IVPB     1.5 g 100  mL/hr over 30 Minutes Intravenous Every 6 hours 07/19/14 0812     07/18/14 1400  azithromycin (ZITHROMAX) 500 mg in dextrose 5 % 250 mL IVPB  Status:  Discontinued     500  mg 250 mL/hr over 60 Minutes Intravenous Every 24 hours 07/17/14 1612 07/23/14 0841   07/18/14 0200  ampicillin-sulbactam (UNASYN) 1.5 g in sodium chloride 0.9 % 50 mL IVPB  Status:  Discontinued     1.5 g 100 mL/hr over 30 Minutes Intravenous Every 8 hours 07/17/14 1621 07/19/14 0812   07/18/14 0000  azithromycin (ZITHROMAX) 500 mg in dextrose 5 % 250 mL IVPB  Status:  Discontinued     500 mg 250 mL/hr over 60 Minutes Intravenous Every 24 hours 07/17/14 1344 07/17/14 1612   07/17/14 1500  Ampicillin-Sulbactam (UNASYN) 3 g in sodium chloride 0.9 % 100 mL IVPB     3 g 100 mL/hr over 60 Minutes Intravenous  Once 07/17/14 1432 07/17/14 1900   07/17/14 1230  cefTRIAXone (ROCEPHIN) 1 g in dextrose 5 % 50 mL IVPB  Status:  Discontinued     1 g 100 mL/hr over 30 Minutes Intravenous  Once 07/17/14 1219 07/17/14 1416   07/17/14 1230  azithromycin (ZITHROMAX) 500 mg in dextrose 5 % 250 mL IVPB     500 mg 250 mL/hr over 60 Minutes Intravenous  Once 07/17/14 1219 07/17/14 1550      DVT Prophylaxis: Prophylactic Lovenox   Code Status: Full code   Family Communication None at bedside  Procedures: L ORIF by Dr. Linna Caprice on 07/20/2014 PEG attempted by IR failed on 07-22-14  CONSULTS:  orthopedic surgery  IR Gen Surgery  Time spent 30 minutes-Greater than 50% of this time was spent in counseling, explanation of diagnosis, planning of further management, and coordination of care.  MEDICATIONS: Scheduled Meds: . ampicillin-sulbactam (UNASYN) IV  1.5 g Intravenous Q6H  . antiseptic oral rinse  7 mL Mouth Rinse q12n4p  . budesonide-formoterol  2 puff Inhalation BID  . chlorhexidine  15 mL Mouth Rinse BID  . [START ON 07/27/2014] enoxaparin (LOVENOX) injection  30 mg Subcutaneous Q24H  . feeding supplement (ENSURE ENLIVE)  237 mL Oral BID BM  . folic acid  1 mg Oral Daily  . levalbuterol  1.25 mg Nebulization TID  . metronidazole  500 mg Intravenous 30 min Pre-Op  . multivitamin with  minerals  1 tablet Oral Daily  . nebivolol  5 mg Oral Daily  . thiamine  100 mg Oral Daily  . tiotropium  18 mcg Inhalation Daily   Continuous Infusions: . dextrose 5 % and 0.45 % NaCl with KCl 10 mEq/L 75 mL/hr at 07/24/14 2329   PRN Meds:.acetaminophen **OR** [DISCONTINUED] acetaminophen, albuterol, diphenhydrAMINE, guaiFENesin, haloperidol lactate, hydrALAZINE, HYDROcodone-acetaminophen, LORazepam **OR** LORazepam, metoprolol, morphine injection, [DISCONTINUED] ondansetron **OR** ondansetron (ZOFRAN) IV    PHYSICAL EXAM: Vital signs in last 24 hours: Filed Vitals:   07/25/14 2100 07/26/14 0000 07/26/14 0635 07/26/14 0850  BP: 150/90 178/93 168/91   Pulse:  86 80   Temp:  97.8 F (36.6 C) 98.4 F (36.9 C)   TempSrc:  Axillary Oral   Resp:  18 18   Height:      Weight:      SpO2:  100% 98% 93%    Weight change:  Filed Weights   07/17/14 1500 07/23/14 1312  Weight: 44.3 kg (97 lb 10.6 oz) 47.7 kg (105 lb 2.6 oz)   Body mass index is 16.47 kg/(m^2).  Gen Exam: Awake and alert with clear speech.  Neck: Supple, No JVD.  Chest: B/L Clear. Transmitted upper airway sounds CVS: S1 S2 Regular, no murmurs.  Abdomen: soft, BS +, non tender, non distended. Extremities: no edema, lower extremities warm to touch. Neurologic: Non Focal.   Skin: No Rash.  Wounds: N/A.   Intake/Output from previous day:  Intake/Output Summary (Last 24 hours) at 07/26/14 1024 Last data filed at 07/26/14 0700  Gross per 24 hour  Intake 2053.75 ml  Output   2350 ml  Net -296.25 ml     LAB RESULTS: CBC  Recent Labs Lab 07/21/14 0725 07/22/14 0345 07/23/14 0424 07/23/14 1550 07/24/14 0930 07/25/14 0444 07/26/14 0440  WBC 7.9 7.8 6.1  --  8.9  --  7.7  HGB 9.0* 7.8* 7.3* 12.9* 10.2* 10.1* 10.5*  HCT 26.8* 23.5* 21.8* 37.8* 28.9* 29.1* 31.0*  PLT 244 196 241  --  283  --  336  MCV 99.6 99.2 100.5*  --  92.6  --  94.5  MCH 33.5 32.9 33.6  --  32.7  --  32.0  MCHC 33.6 33.2 33.5  --   35.3  --  33.9  RDW 13.0 13.0 12.7  --  14.0  --  13.0    Chemistries   Recent Labs Lab 07/20/14 0525 07/21/14 0725 07/22/14 0345 07/24/14 0930 07/25/14 0444  NA 137 137 138 135  --   K 3.8 3.6 3.5 3.6  --   CL 101 100* 104 96*  --   CO2 24 26 27 30   --   GLUCOSE 109* 91 89 99  --   BUN 13 12 12 8   --   CREATININE 0.95 0.88 0.87 0.66  --   CALCIUM 8.3* 8.1* 7.7* 8.2*  --   MG  --  1.3* 1.6* 1.3* 1.5*    CBG:  Recent Labs Lab 07/23/14 0319  GLUCAP 75    GFR Estimated Creatinine Clearance: 59.6 mL/min (by C-G formula based on Cr of 0.66).  Coagulation profile  Recent Labs Lab 07/19/14 1906  INR 1.12    Cardiac Enzymes No results for input(s): CKMB, TROPONINI, MYOGLOBIN in the last 168 hours.  Invalid input(s): CK  Invalid input(s): POCBNP No results for input(s): DDIMER in the last 72 hours. No results for input(s): HGBA1C in the last 72 hours. No results for input(s): CHOL, HDL, LDLCALC, TRIG, CHOLHDL, LDLDIRECT in the last 72 hours. No results for input(s): TSH, T4TOTAL, T3FREE, THYROIDAB in the last 72 hours.  Invalid input(s): FREET3 No results for input(s): VITAMINB12, FOLATE, FERRITIN, TIBC, IRON, RETICCTPCT in the last 72 hours. No results for input(s): LIPASE, AMYLASE in the last 72 hours.  Urine Studies No results for input(s): UHGB, CRYS in the last 72 hours.  Invalid input(s): UACOL, UAPR, USPG, UPH, UTP, UGL, UKET, UBIL, UNIT, UROB, ULEU, UEPI, UWBC, URBC, UBAC, CAST, UCOM, BILUA  MICROBIOLOGY: Recent Results (from the past 240 hour(s))  Blood culture (routine x 2)     Status: None   Collection Time: 07/17/14  1:28 PM  Result Value Ref Range Status   Specimen Description BLOOD LEFT HAND  Final   Special Requests BOTTLES DRAWN AEROBIC ONLY 2CC  Final   Culture   Final    NO GROWTH 5 DAYS Note: Culture results may be compromised due to an inadequate volume of blood received in culture bottles. Performed at [facility]     Report Status 07/23/2014 FINAL  Final  Blood culture (  routine x 2)     Status: None   Collection Time: 07/17/14  1:39 PM  Result Value Ref Range Status   Specimen Description BLOOD RIGHT HAND  Final   Special Requests BOTTLES DRAWN AEROBIC ONLY 2CC  Final   Culture   Final    NO GROWTH 5 DAYS Performed at [facility]    Report Status 07/23/2014 FINAL  Final  Urine culture     Status: None   Collection Time: 07/17/14  4:40 PM  Result Value Ref Range Status   Specimen Description URINE, CLEAN CATCH  Final   Special Requests NONE  Final   Colony Count NO GROWTH Performed at [facility]   Final   Culture NO GROWTH Performed at [facility]   Final   Report Status 07/18/2014 FINAL  Final  Culture, respiratory (NON-Expectorated)     Status: None   Collection Time: 07/17/14  5:00 PM  Result Value Ref Range Status   Specimen Description SPUTUM  Final   Special Requests NONE  Final   Gram Stain   Final    NO WBC SEEN RARE SQUAMOUS EPITHELIAL CELLS PRESENT FEW GRAM POSITIVE COCCI IN PAIRS RARE GRAM POSITIVE RODS Performed at [facility]    Culture   Final    NORMAL OROPHARYNGEAL FLORA Performed at [facility]    Report Status 07/20/2014 FINAL  Final  MRSA PCR Screening     Status: None   Collection Time: 07/22/14  4:22 PM  Result Value Ref Range Status   MRSA by PCR NEGATIVE NEGATIVE Final    Comment:        The GeneXpert MRSA Assay (FDA approved for NASAL specimens only), is one component of a comprehensive MRSA colonization surveillance program. It is not intended to diagnose MRSA infection nor to guide or monitor treatment for MRSA infections.     RADIOLOGY STUDIES/RESULTS: Ct Abdomen Wo Contrast  07/21/2014   CLINICAL DATA:  69 year old male with generalized weakness. Evaluate anatomy prior to gastrostomy tube placement. History of dysphagia.  EXAM: CT ABDOMEN WITHOUT CONTRAST  TECHNIQUE: Multidetector CT imaging of the  abdomen was performed following the standard protocol without IV contrast.  COMPARISON:  No priors.  FINDINGS: Lower chest: Areas of airspace consolidation in the right middle lobe and right lower lobe. Small right pleural effusion layering dependently. Areas of bronchiectasis and potential cavitation in the posterior aspect of the right lower lobe, poorly evaluated on today's noncontrast CT examination. 1.6 x 1.0 cm nodular density in the medial aspect of the left lower lobe (image 13 of series 5). Trace left pleural effusion. Atherosclerotic calcifications in the left anterior descending and right coronary arteries.  Hepatobiliary: No definite cystic or solid hepatic lesions are identified on today's noncontrast CT examination. Gallbladder is not confidently identified and could be collapsed or surgically absent (no surgical clips are noted in the region of the gallbladder fossa).  Pancreas: Pancreas is poorly depicted, but grossly unremarkable.  Spleen: Unremarkable.  Adrenals/Urinary Tract: The unenhanced appearance of the kidneys and adrenal glands is normal bilaterally. No hydroureteronephrosis.  Stomach/Bowel: The unenhanced appearance of the visualized portions of the stomach is unremarkable. Importantly, however, there are loops of small bowel and colon superficial to the visualized portions of the stomach. No pathologic dilatation of the visualized portions of the small bowel or colon.  Vascular/Lymphatic: Atherosclerosis throughout the visualized abdominal vasculature, without definite aneurysm. No definite lymphadenopathy in the visualized portions of the abdomen on today's  noncontrast CT examination.  Other: No significant volume of ascites or definite pneumoperitoneum identified in the abdomen.  Musculoskeletal: Chronic appearing compression fracture of L1 with approximately 20% loss of anterior vertebral body height. There are no aggressive appearing lytic or blastic lesions noted in the visualized  portions of the skeleton.  IMPRESSION: 1. Stomach is incompletely visualized on today's examination. The visualized portions of the stomach are remarkable for superficial loops of small bowel and colon interposed between the stomach in the anterior abdominal wall. 2. Airspace consolidation in the right middle and lower lobes concerning for acute infection. This appears be associated with some areas of bronchiectasis and potential cavitation in the posterior aspect of the right lower lobe. There is also a small right parapneumonic pleural effusion. 3. Pleural-based 1.6 x 1.0 cm nodular density in the medial aspect of the left lower lobe. This could also be infectious or inflammatory in etiology, however, the possibility of neoplasm is not excluded, and close attention on future followup examinations is recommended. There is also trace left pleural effusion. At this time, noncontrast chest CT is recommended in 2-3 weeks to assess for resolution of the right lower/middle lobe pneumonia following trial of antimicrobial therapy, and to reassess this left lower lobe nodular region. 4. Extensive atherosclerosis, including at least 2 vessel coronary artery disease. Please note that although the presence of coronary artery calcium documents the presence of coronary artery disease, the severity of this disease and any potential stenosis cannot be assessed on this non-gated CT examination. Assessment for potential risk factor modification, dietary therapy or pharmacologic therapy may be warranted, if clinically indicated. 5. Additional incidental findings, as above.   Electronically Signed   By: Trudie Reed M.D.   On: 07/21/2014 20:26   Ct Head Wo Contrast  07/19/2014   CLINICAL DATA:  Headache, hallucinating objects  EXAM: CT HEAD WITHOUT CONTRAST  TECHNIQUE: Contiguous axial images were obtained from the base of the skull through the vertex without intravenous contrast.  COMPARISON:  None  FINDINGS: There is no  evidence of mass effect, midline shift, or extra-axial fluid collections. There is no evidence of a space-occupying lesion or intracranial hemorrhage. There is no evidence of a cortical-based area of acute infarction. 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 There is generalized cerebral atrophy. There is periventricular white matter low attenuation likely secondary to microangiopathy.  The ventricles and sulci are appropriate for the patient's age. The basal cisterns are patent.  Visualized portions of the orbits are unremarkable. There is near complete opacification of the left maxillary sinus. There is an air-fluid level in the right sphenoid sinus. There is mild left ethmoid sinus mucosal thickening. Cerebrovascular atherosclerotic calcifications are noted.  The osseous structures are unremarkable.  IMPRESSION: 1. No acute intracranial pathology. 2. Chronic microvascular disease and cerebral atrophy. 3. Right sphenoid and left maxillary sinus disease. Mild left ethmoid sinus disease.   Electronically Signed   By: Elige Ko   On: 07/19/2014 18:01   Pelvis Portable  07/20/2014   CLINICAL DATA:  69 year old status post left hip repair  EXAM: PORTABLE PELVIS 1-2 VIEWS  COMPARISON:  Preoperative radiographs 07/19/2014  FINDINGS: Interval ORIF of intertrochanteric left femoral fracture with intra medullary nail and a transfemoral neck gamma nail. Improved alignment of the fracture fragments. No evidence of acute hardware complication. Expected postoperative subcutaneous emphysema. Oral contrast material opacifies the colon. Scattered atherosclerotic vascular calcifications are noted.  IMPRESSION: ORIF left intertrochanteric femoral fracture without evidence of complication.  Electronically Signed   By: Malachy Moan M.D.   On: 07/20/2014 21:15   Ir Fluoro Rm 30-60 Min  07/23/2014   CLINICAL DATA:  Dysphagia, aspiration pneumonia. Request for enteral feeding support. Recent CT had demonstrated no  safe percutaneous approach to the nondistended stomach due to overlying small bowel and colon.  EXAM: IR FLOURO RM 0-60 MIN  COMPARISON:  CT 07/21/2014  TECHNIQUE: Patient was positioned prone on the supine on the procedure table.  Intravenous Fentanyl and Versed were administered as conscious sedation during continuous cardiorespiratory monitoring by the radiology RN, with a total moderate sedation time of less than 30 minutes.  A 5 French angled angiographic catheter placed as orogastric tube to allow gastric insufflation with air. Under fluoroscopy, using multiple obliquities, it was apparent that the redundant transverse colon and loops of small bowel continued overlie the gastric body and antrum, such that no safe percutaneous approach was available for gastrostomy tube placement.  IMPRESSION: 1. No safe percutaneous approach for gastrostomy tube placement due to overlying bowel. Consider surgical placement if needed.   Electronically Signed   By: Corlis Leak M.D.   On: 07/23/2014 08:10   Dg Chest Port 1 View  07/22/2014   CLINICAL DATA:  Right lower lobe infiltrate. Increasing shortness of breath and chest congestion.  EXAM: PORTABLE CHEST - 1 VIEW  COMPARISON:  07/17/2014  FINDINGS: Slight improvement in the pneumonia in the right lower lobe. New accentuation of the interstitial markings at the left lung base. Probable small bilateral pleural effusions superimposed on emphysema. Tortuosity of the thoracic aorta. Overall heart size is normal.  IMPRESSION: Slight improvement in right lower lobe pneumonia. Interstitial accentuation at the left lung base could represent pneumonitis. Is the patient aspirating? COPD. Probable small effusions.   Electronically Signed   By: Francene Boyers M.D.   On: 07/22/2014 07:40   Dg Chest Port 1 View  07/17/2014   CLINICAL DATA:  Pt found on kitchen floor. Believed to be there for approx 4 hrs. Sts he fell but sat himself down, wasn't a "crash." Complains of sob and  congestion x3 days. Was too weak to stand up from fall. Has been taking mucinex. Per neighbor, he started coughing up blood clots yesterday.  EXAM: PORTABLE CHEST - 1 VIEW  COMPARISON:  04/27/2004  FINDINGS: There is dense consolidation in knee right lower lung, projecting in the right lower lobe. This is new from the prior study.  Lungs are hyperexpanded consistent with underlying COPD. No other lung consolidation. No pulmonary edema. No pleural effusion or pneumothorax.  Cardiac silhouette is normal in size. Aorta is uncoiled. No mediastinal or hilar masses or convincing adenopathy.  Bony thorax is demineralized but intact.  IMPRESSION: Right lower lobe consolidation consistent with pneumonia. Underlying COPD.   Electronically Signed   By: Amie Portland M.D.   On: 07/17/2014 11:58   Dg Knee Left Port  07/19/2014   CLINICAL DATA:  Status post fall.  EXAM: PORTABLE LEFT KNEE - 1-2 VIEW  COMPARISON:  None.  FINDINGS: There is no evidence of fracture, dislocation, or joint effusion. There is generalized osteopenia. There is no evidence of arthropathy or other focal bone abnormality. Soft tissues are unremarkable. There is peripheral vascular atherosclerotic disease.  IMPRESSION: No acute osseous injury of the left knee.   Electronically Signed   By: Elige Ko   On: 07/19/2014 20:25   Dg Swallowing Func-speech Pathology  07/19/2014    Objective Swallowing Evaluation:  Patient Details  Name: Phillip May MRN: [id number] Date of Birth: February 10, 1946  Today's Date: 07/19/2014 Time: SLP Start Time (ACUTE ONLY): 1120-SLP Stop Time (ACUTE ONLY): 1155 SLP Time Calculation (min) (ACUTE ONLY): 35 min  Past Medical History:  Past Medical History  Diagnosis Date  . Inguinal hernia   . COPD (chronic obstructive pulmonary disease)   . GERD (gastroesophageal reflux disease)   . Chronic hoarseness   . Tobacco abuse   . Cervical spinal stenosis     C4-C5; C5-C6; severe stenosis and abnormal  cord signal C6-C7 s/p  decompression  in 04/2004   Past Surgical History:  Past Surgical History  Procedure Laterality Date  . Inguinal hernia repair    . Posterior laminectomy / decompression cervical spine  2006   HPI:  Other Pertinent Information: Kayde Warehime is a 69 y.o. male, history of  COPD (not on oxygen), ongoing smoking, occasional alcohol use, history of  dysphagia on a soft diet per MBS in September 2014, chronic hoarse voice,  GERD, generalized weakness and deconditioning.  PSH + for cervical  decompression surgery in 2006.  Pt reported chronic problems with  hoarseness and dysphagia s/p cervical surgery.  He also experienced 30  pound weight loss within 3 years-pt reported in 2014.  He lives at home  alone and receives help from neighbors for getting groceries and  activities of daily living.  The MBS in September 2014 did show trace  aspiration of thins and penetration of nectar liquids.  CXR showed right  lower lobe pna.    No Data Recorded  Assessment / Plan / Recommendation CHL IP CLINICAL IMPRESSIONS 07/19/2014  Therapy Diagnosis Severe pharyngeal phase dysphagia;Severe cervical  esophageal phase dysphagia;Moderate pharyngeal phase dysphagia;Moderate  cervical esophageal phase dysphagia  Clinical Impression Known chronic dysphagia with suspected acute  exacerbation.  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    Weakness in pharyngeal contraction and tongue base retraction/poor  epiglottic deflection results in gross pharyngeal residuals that mix with  secretions without adequate pt sensation.  With liquids, pt conducts  multiple reflexive swallows (x4 - with liquids) with extended breathhold  *presumed to be compensatory to help clear and prevent gross aspiration.   Poor hyolaryngeal elevation allows laryngeal penetration of liquids during  the swallow.  Liquid swallows helpful to reduce solid food residuals in  pharynx.    Mild amount of aspiration (silent) after the  swallow noted as barium  spills posterior into open larynx from pyriform sinus.   Pt did not sense  residuals today (mixed with secretions) but cued swallow, liquid follow  solids and cued expectoration (of secretions mixed with barium) effective  to clear 80%.    Per previous MBS in 2014 *Dr Bradly Chris, radiologist stated findings  consistent with esophagram from 08/2012 indicating narrowing of lower  cervical esophagus and dilated cervical esophagus.  SLP today noted area  near cervical hardware where barium pools slightly, clears with reflexive  swallows.   Mr Rabine again reports desire to eat with known aspiration/malnutrition  risks.  Pt does admit to further weight loss of 5 pounds since 2014 and  states he has "good days and bad days" re: swallowing.  SLP  advised him  to monitor swallowing/nutrition closely to maximize  hydration/nutrition/airway safety.  Pt readily states liquid are easier  for him to consume.    To respect pt wishes, recommend dys3/thin with precautions.  Pt will  require crushed or liquid  medications due to level of dysphagia.  Using  live video, educated pt to findings, effective compensations.  Also  recommend pt have suction set up to help with his secretion management.           No flowsheet data found.   CHL IP DIET RECOMMENDATION 07/19/2014  SLP Diet Recommendations Dysphagia 3 (Mech soft);Thin  Liquid Administration via cup  Medication Administration Crushed with puree  Compensations Slow rate;Small sips/bites;Follow solids with  liquid;Multiple dry swallows after each bite/sip, cough and expectorate  Postural Changes and/or Swallow Maneuvers (None)     CHL IP OTHER RECOMMENDATIONS 07/19/2014  Recommended Consults (None)  Oral Care Recommendations Oral care QID  Other Recommendations (None)     CHL IP FOLLOW UP RECOMMENDATIONS 10/28/2012  Follow up Recommendations None     CHL IP FREQUENCY AND DURATION 07/19/2014  Speech Therapy Frequency (ACUTE ONLY) min 2x/week  Treatment Duration 1 week          CHL IP REASON FOR REFERRAL 07/19/2014  Reason for Referral Objectively evaluate swallowing function     CHL IP ORAL PHASE 07/19/2014                    Oral Phase WFL                     CHL IP PHARYNGEAL PHASE 07/19/2014  Pharyngeal Phase Impaired  Pharyngeal Comment multiple swallows across consistencies, following solid  with liquids helpful to decrease vallecular residuals, cough and  expectoration helpful, limited postural changes available due to cervical  decompression sx in 06      Donavan Burnet, MS [facility] SLP [phone]    Dg C-arm 1-60 Min  07/20/2014   CLINICAL DATA:  69 year old male with left intertrochanteric femoral fracture undergoing ORIF  EXAM: DG C-ARM 61-120 MIN; LEFT FEMUR 2 VIEWS  COMPARISON:  Preoperative radiographs 07/19/2014  FINDINGS: For intraoperative spot radiographs demonstrate open reduction internal fixation of intertrochanteric femoral fracture with an intra medullary femoral nail and transfemoral neck gamma nail. No evidence of immediate hardware complication. There is a single distal interlocking screw.  IMPRESSION: ORIF of left intertrochanteric femoral fracture without evidence of immediate complication   Electronically Signed   By: Malachy Moan M.D.   On: 07/20/2014 20:39   Dg Hip Unilat With Pelvis 2-3 Views Left  07/19/2014   CLINICAL DATA:  Fall today with left hip pain, initial encounter  EXAM: LEFT HIP (WITH PELVIS) 2-3 VIEWS  COMPARISON:  None.  FINDINGS: There is an a minimally displaced intratrochanteric fracture of the proximal left femur. No dislocation is noted. Contrast material is seen from prior speech pathology examination. The pelvic ring is otherwise intact. No other focal abnormality is seen.  IMPRESSION: Mildly displaced left intratrochanteric femoral fracture.   Electronically Signed   By: Alcide Clever M.D.   On: 07/19/2014 17:34   Dg Femur Min 2 Views Left  07/20/2014   CLINICAL DATA:  Postop left hip fracture repair  EXAM: LEFT FEMUR 2 VIEWS   COMPARISON:  07/19/2014  FINDINGS: Internal fixation across the left intertrochanteric femoral fracture. Near anatomic alignment. No hardware or bony complicating feature.  IMPRESSION: Internal fixation across the left femoral intertrochanteric fracture. No complicating feature.   Electronically Signed   By: Charlett Nose M.D.   On: 07/20/2014 21:20   Dg Femur Min 2 Views Left  07/20/2014   CLINICAL DATA:  69 year old male with left intertrochanteric femoral fracture undergoing ORIF  EXAM: DG  C-ARM 61-120 MIN; LEFT FEMUR 2 VIEWS  COMPARISON:  Preoperative radiographs 07/19/2014  FINDINGS: For intraoperative spot radiographs demonstrate open reduction internal fixation of intertrochanteric femoral fracture with an intra medullary femoral nail and transfemoral neck gamma nail. No evidence of immediate hardware complication. There is a single distal interlocking screw.  IMPRESSION: ORIF of left intertrochanteric femoral fracture without evidence of immediate complication   Electronically Signed   By: Malachy Moan M.D.   On: 07/20/2014 20:39    Jeoffrey Massed, MD  [facility] Pager:[phone]  If 7PM-7AM, please contact night-coverage [email or web address] Password [email or web address] 07/26/2014, 10:24 AM   LOS: 9 days

## 2014-07-26 NOTE — Progress Notes (Signed)
Dr. Ezzard Standing in to talk with patient as he was being prepared for surgery in the holding area.  States he ha discussed planned surgery with patient's sson again, and family would like to delay surgery until they have had a chance to discuss the situation further. Matter was dicussed via speaker phon with dr. Ezzard Standing , patient and son.  Decision made to delay surgery until resolution obtained.  Patient transferred via be back to inpatient room.

## 2014-07-26 NOTE — Progress Notes (Addendum)
The patient was admitted on 07/17/2014 for acute on chronic respiratory failure.  He is also noted to have chronic dysphagia.  His PCP is Dr. Franchot Mimes. He is followed by Dr. Marchelle Gearing for COPD.  Mr. Phillip May had a left hip fracture while getting up from chair on 07/19/2014 (while in hospital) repaired on 07/20/2014 by Dr. Linna Caprice.  Though he was living by himself prior to admission, [facility] is [facility]  The patient himself is unsure he wants to proceed with the procedure.  His daughter gave verbal consent last PM.  Pt daughter, Renda Rolls.  Telephone [phone]).  Shari Heritage, [place] [phone].  I tried to call both, but no answer.   Ovidio Kin, MD, [facility] Pager: [phone] Office phone:  [phone]  I spoke to both daughter, Renda Rolls, and son, [place], by phone.  They want to wait a day or two to do the surgery.  I do not think that they entirely understand what is going on.  They are going to talk to their father further.  Will delay surgery for now.  Ovidio Kin, MD, [facility] Pager: [phone] Office phone:  [phone].

## 2014-07-26 NOTE — Progress Notes (Signed)
OT Cancellation Note  Patient Details Name: Phillip May MRN: [id number] DOB: 1945/10/26   Cancelled Treatment:    Reason Eval/Treat Not Completed: Other (comment) Note plan for possible G tube placement. Will check back another time.  Lennox Laity  [phone] 07/26/2014, 10:08 AM

## 2014-07-26 NOTE — Progress Notes (Signed)
Speech Language Pathology Treatment: Dysphagia  Patient Details Name: Phillip May MRN: [id number] DOB: 03/27/1945 Today's Date: 07/26/2014 Time: 1550-1610 SLP Time Calculation (min) (ACUTE ONLY): 20 min  Assessment / Plan / Recommendation Clinical Impression  Pt presents with ongoing symptoms of severe pharyngeal dysphagia.  Wet gurgly cough noted with adequate ability to expectorate with cues.  After oral care, SLP administered single ice chip boluses.  Swallow immediately followed by multiple swallows and overt coughing.  Do not anticipate swallow improvement to be at functional level but plan to proceed with MBS to allow instrumental swallow evaluation.    Of note, pt again confirms desire to continue to consume po intake regardless of swallow ability and admits to worsening dysphagia x2 months PTA.  Chronic dysphagia noted since pt underwent cervical spine decompression per his statement.  Pt was hallucinating some today indicating desire for SLP to pass him the [employer] cup and get the plate off the wall for this pt.  Pt self admits that he is "delirious".       Educated pt to recommendations and plan for repeat MBS next am.    HPI Other Pertinent Information: Phillip May is a 69 y.o. male, history of COPD (not on oxygen), ongoing smoking, occasional alcohol use, history of dysphagia on a soft diet per MBS in September 2014, chronic hoarse voice, GERD, generalized weakness and deconditioning.  PSH + for cervical decompression surgery in 2006.  Pt reported chronic problems with hoarseness and dysphagia s/p cervical surgery.  He also experienced 30 pound weight loss within 3 years-pt reported in 2014.  He lives at home alone and receives help from neighbors for getting groceries and activities of daily living.  The MBS in September 2014 did show trace aspiration of thins and penetration of nectar liquids.  CXR showed right lower lobe pna.    Pt had planned on getting PEG tube but today this  was discontinued.  MD ordered reevaluation.     Pertinent Vitals Pain Assessment: No/denies pain  SLP Plan  MBS (next date)    Recommendations Diet recommendations: NPO              Oral Care Recommendations: Oral care QID Follow up Recommendations: Skilled Nursing facility Plan: MBS (next date)    GO    Donavan Burnet, MS [facility] SLP [phone]

## 2014-07-26 NOTE — Progress Notes (Signed)
PT Cancellation Note  Patient Details Name: Phillip May MRN: [id number] DOB: 1945-08-26   Cancelled Treatment:     poosible planned LAP G tube with Dr Ezzard Standing today.  Will check back another day.   Armando Reichert 07/26/2014, 9:05 AM

## 2014-07-27 ENCOUNTER — Inpatient Hospital Stay (HOSPITAL_COMMUNITY): Payer: Medicare Other

## 2014-07-27 DIAGNOSIS — S72141A Displaced intertrochanteric fracture of right femur, initial encounter for closed fracture: Secondary | ICD-10-CM

## 2014-07-27 LAB — CREATININE, SERUM: CREATININE: 0.64 mg/dL (ref 0.61–1.24)

## 2014-07-27 MED ORDER — ENOXAPARIN SODIUM 30 MG/0.3ML ~~LOC~~ SOLN
30.0000 mg | SUBCUTANEOUS | Status: DC
  Administered 2014-07-29 – 2014-07-30 (×2): 30 mg via SUBCUTANEOUS
  Filled 2014-07-27 (×2): qty 0.3

## 2014-07-27 NOTE — Progress Notes (Addendum)
PATIENT DETAILS Name: Phillip May Age: 69 y.o. Sex: male Date of Birth: March 17, 1945 Admit Date: 07/17/2014 Admitting Physician Leroy Sea, MD ZOX:[id number] DAVIDSON, MD  Brief narrative  69 year old male with history of COPD, EtOH use, tobacco abuse, chronic dysphagia, GERD who lives at home by himself with the help of neighbors was found on the floor on the day of admission and brought to the emergency room. Found to have acute on chronic respiratory failure likely secondary to presumed aspiration pneumonia, patient was subsequently admitted to the hospital. Hospital course has been complicated by development of delirium which resulted in a fall causing a left hip fracture. During this hospital stay, patient was seen by speech therapy and found to have significant dysphagia and was kept nothing by mouth. Unfortunately Dysphagia has not improved, after discussion with patient, PEG tube placement was planned, because of the anatomy this was not able to be done by interventional radiology, general surgery planned PEG tube placement on 6/13-however patient/family were hesistant to proceed-and is now tentatively scheduled for 07/28/14  Subjective: Now agreeable to have a peg tube-claims his son was not ready yesterday  Assessment/Plan: Principal Problem: Presumed aspiration pneumonia: Has completed a course of Unasyn- discontinued on 6/13. Remains afebrile and nontoxic looking.  Active Problems: Acute on chronic respiratory failure: Secondary to above. Continue nothing by mouth-await placement of PEG tube. Both family (spoke to daughter and son 6/13) and patient aware that patient will continue to have aspiration risk with or without peg tube.  Left hip fracture: Occurred on 6/7 after sustaining a mechanical fall while delirious in the hospital. Orthopedics consulted, underwent ORIF on 07/20/2014. On Lovenox for DVT prophylaxis. Hospital course was complicated by  development of perioperative blood loss anemia requiring 1 unit of PRBC on 6/10. Hemoglobin currently stable.  Chronic dysphagia: See above-underwent SLP evaluation recommendations were NPO.Peg tube placement atempted by IR 07/22/2014-however unsuccessful due to overlying small bowel, consulted general surgery on 07/23/2014, was due for PEG tube placement 6/13-however patient and family were hesitant. I spoke with patient, son and daughter 6/13 after much discussion-family has now decided to go ahead with peg tube placement. I have asked SLP to evaluate one more time, as patient claims that he would like to continue eating with known aspiration risks .Furthermore both family  and patient aware that patient will continue to have aspiration risk with or without peg tube.  Addendum:repeat SLP eval noted-spoke with patient and then with daughter-Najah (over the phone)-both are now agreeable for peg tube placement where from patient will get majority of his nutrition needs. However patient wants to continue to have some oral intake for comfort-I informed both patient and the daughter about known aspiration risk-causing life threatening PNA, Choking-leading to disability and death. Both were understanding of risks, but were wanted to proceed with some oral intake for comfort. Have ordered a dysphagia 3 diet with thin liquids as recommended by SLP.  Hypertension: Uncontrolled-on Amlodipine and Bystolic-however NPO yesterday for peg tube placement and currently NPO for Modified Barium Swallow  Sepsis: Secondary to pneumonia. Has resolved with IV fluids and antibiotics.  History of COPD: stable with clear lungs. No current bronchodilators regimen.  Tobacco abuse: Counseled extensively.  Alcohol abuse with withdrawal: No signs of withdrawal currently. He remains on Ativan per protocol.  Anemia: Multifactorial-suspect has anemia of chronic disease at baseline, worsened by perioperative blood loss following left  hip repair.  Hemoglobin currently stable. Monitor periodically.  Severe Protein-calorie malnutrition: Continue supplements  Disposition: Remain inpatient-SNF once PEG tube is placed  Antimicrobial agents  See below  Anti-infectives    Start     Dose/Rate Route Frequency Ordered Stop   07/26/14 0000  metroNIDAZOLE (FLAGYL) IVPB 500 mg    Comments:  Pharmacy may adjust dosing strength, interval, or rate of medication as needed for optimal therapy for the patient Send with patient on call to the OR.  Anesthesia to complete antibiotic administration <61min prior to incision per [facility].   500 mg 100 mL/hr over 60 Minutes Intravenous 30 min pre-op 07/25/14 1043     07/21/14 0000  ceFAZolin (ANCEF) IVPB 2 g/50 mL premix     2 g 100 mL/hr over 30 Minutes Intravenous Every 6 hours 07/20/14 2139 07/21/14 0646   07/20/14 1000  ceFAZolin (ANCEF) IVPB 2 g/50 mL premix     2 g 100 mL/hr over 30 Minutes Intravenous  Once 07/19/14 2133 07/20/14 1833   07/19/14 1000  ampicillin-sulbactam (UNASYN) 1.5 g in sodium chloride 0.9 % 50 mL IVPB  Status:  Discontinued     1.5 g 100 mL/hr over 30 Minutes Intravenous Every 6 hours 07/19/14 0812 07/26/14 1027   07/18/14 1400  azithromycin (ZITHROMAX) 500 mg in dextrose 5 % 250 mL IVPB  Status:  Discontinued     500 mg 250 mL/hr over 60 Minutes Intravenous Every 24 hours 07/17/14 1612 07/23/14 0841   07/18/14 0200  ampicillin-sulbactam (UNASYN) 1.5 g in sodium chloride 0.9 % 50 mL IVPB  Status:  Discontinued     1.5 g 100 mL/hr over 30 Minutes Intravenous Every 8 hours 07/17/14 1621 07/19/14 0812   07/18/14 0000  azithromycin (ZITHROMAX) 500 mg in dextrose 5 % 250 mL IVPB  Status:  Discontinued     500 mg 250 mL/hr over 60 Minutes Intravenous Every 24 hours 07/17/14 1344 07/17/14 1612   07/17/14 1500  Ampicillin-Sulbactam (UNASYN) 3 g in sodium chloride 0.9 % 100 mL IVPB     3 g 100 mL/hr over 60 Minutes Intravenous  Once 07/17/14 1432 07/17/14 1900     07/17/14 1230  cefTRIAXone (ROCEPHIN) 1 g in dextrose 5 % 50 mL IVPB  Status:  Discontinued     1 g 100 mL/hr over 30 Minutes Intravenous  Once 07/17/14 1219 07/17/14 1416   07/17/14 1230  azithromycin (ZITHROMAX) 500 mg in dextrose 5 % 250 mL IVPB     500 mg 250 mL/hr over 60 Minutes Intravenous  Once 07/17/14 1219 07/17/14 1550      DVT Prophylaxis: Prophylactic Lovenox   Code Status: Full code   Family Communication None at bedside  Procedures: L ORIF by Dr. Linna Caprice on 07/20/2014 PEG attempted by IR failed on 07-22-14  CONSULTS:  orthopedic surgery  IR Gen Surgery  Time spent 25 minutes-Greater than 50% of this time was spent in counseling, explanation of diagnosis, planning of further management, and coordination of care.  MEDICATIONS: Scheduled Meds: . amLODipine  5 mg Oral Daily  . antiseptic oral rinse  7 mL Mouth Rinse q12n4p  . budesonide-formoterol  2 puff Inhalation BID  . chlorhexidine  15 mL Mouth Rinse BID  . enoxaparin (LOVENOX) injection  30 mg Subcutaneous Q24H  . feeding supplement (ENSURE ENLIVE)  237 mL Oral BID BM  . folic acid  1 mg Oral Daily  . levalbuterol  1.25 mg Nebulization TID  . metronidazole  500 mg Intravenous 30 min Pre-Op  .  multivitamin with minerals  1 tablet Oral Daily  . nebivolol  5 mg Oral Daily  . thiamine  100 mg Oral Daily  . tiotropium  18 mcg Inhalation Daily   Continuous Infusions:   PRN Meds:.acetaminophen **OR** [DISCONTINUED] acetaminophen, albuterol, diphenhydrAMINE, guaiFENesin, haloperidol lactate, hydrALAZINE, HYDROcodone-acetaminophen, metoprolol, morphine injection, [DISCONTINUED] ondansetron **OR** ondansetron (ZOFRAN) IV    PHYSICAL EXAM: Vital signs in last 24 hours: Filed Vitals:   07/27/14 0502 07/27/14 0755 07/27/14 0921 07/27/14 0934  BP: 176/99 165/98 162/86   Pulse: 88 64 84   Temp: 98.1 F (36.7 C)     TempSrc: Oral     Resp: 18     Height:      Weight:      SpO2: 98% 94% 95% 93%     Weight change:  Filed Weights   07/17/14 1500 07/23/14 1312  Weight: 44.3 kg (97 lb 10.6 oz) 47.7 kg (105 lb 2.6 oz)   Body mass index is 16.47 kg/(m^2).   Gen Exam: Awake and alert with clear speech. Not in distress Neck: Supple, No JVD.  Chest: B/L Clear. Continues to have transmitted upper airway sounds CVS: S1 S2 Regular, no murmurs.  Abdomen: soft, BS +, non tender, non distended. Extremities: no edema, lower extremities warm to touch. Neurologic: Non Focal.   Skin: No Rash.  Wounds: N/A.   Intake/Output from previous day:  Intake/Output Summary (Last 24 hours) at 07/27/14 1012 Last data filed at 07/27/14 0610  Gross per 24 hour  Intake      0 ml  Output   1201 ml  Net  -1201 ml     LAB RESULTS: CBC  Recent Labs Lab 07/21/14 0725 07/22/14 0345 07/23/14 0424 07/23/14 1550 07/24/14 0930 07/25/14 0444 07/26/14 0440  WBC 7.9 7.8 6.1  --  8.9  --  7.7  HGB 9.0* 7.8* 7.3* 12.9* 10.2* 10.1* 10.5*  HCT 26.8* 23.5* 21.8* 37.8* 28.9* 29.1* 31.0*  PLT 244 196 241  --  283  --  336  MCV 99.6 99.2 100.5*  --  92.6  --  94.5  MCH 33.5 32.9 33.6  --  32.7  --  32.0  MCHC 33.6 33.2 33.5  --  35.3  --  33.9  RDW 13.0 13.0 12.7  --  14.0  --  13.0    Chemistries   Recent Labs Lab 07/21/14 0725 07/22/14 0345 07/24/14 0930 07/25/14 0444 07/27/14 0441  NA 137 138 135  --   --   K 3.6 3.5 3.6  --   --   CL 100* 104 96*  --   --   CO2 26 27 30   --   --   GLUCOSE 91 89 99  --   --   BUN 12 12 8   --   --   CREATININE 0.88 0.87 0.66  --  0.64  CALCIUM 8.1* 7.7* 8.2*  --   --   MG 1.3* 1.6* 1.3* 1.5*  --     CBG:  Recent Labs Lab 07/23/14 0319  GLUCAP 75    GFR Estimated Creatinine Clearance: 59.6 mL/min (by C-G formula based on Cr of 0.64).  Coagulation profile No results for input(s): INR, PROTIME in the last 168 hours.  Cardiac Enzymes No results for input(s): CKMB, TROPONINI, MYOGLOBIN in the last 168 hours.  Invalid input(s): CK  Invalid  input(s): POCBNP No results for input(s): DDIMER in the last 72 hours. No results for input(s): HGBA1C in the last 72  hours. No results for input(s): CHOL, HDL, LDLCALC, TRIG, CHOLHDL, LDLDIRECT in the last 72 hours. No results for input(s): TSH, T4TOTAL, T3FREE, THYROIDAB in the last 72 hours.  Invalid input(s): FREET3 No results for input(s): VITAMINB12, FOLATE, FERRITIN, TIBC, IRON, RETICCTPCT in the last 72 hours. No results for input(s): LIPASE, AMYLASE in the last 72 hours.  Urine Studies No results for input(s): UHGB, CRYS in the last 72 hours.  Invalid input(s): UACOL, UAPR, USPG, UPH, UTP, UGL, UKET, UBIL, UNIT, UROB, ULEU, UEPI, UWBC, URBC, UBAC, CAST, UCOM, BILUA  MICROBIOLOGY: Recent Results (from the past 240 hour(s))  Blood culture (routine x 2)     Status: None   Collection Time: 07/17/14  1:28 PM  Result Value Ref Range Status   Specimen Description BLOOD LEFT HAND  Final   Special Requests BOTTLES DRAWN AEROBIC ONLY 2CC  Final   Culture   Final    NO GROWTH 5 DAYS Note: Culture results may be compromised due to an inadequate volume of blood received in culture bottles. Performed at [facility]    Report Status 07/23/2014 FINAL  Final  Blood culture (routine x 2)     Status: None   Collection Time: 07/17/14  1:39 PM  Result Value Ref Range Status   Specimen Description BLOOD RIGHT HAND  Final   Special Requests BOTTLES DRAWN AEROBIC ONLY 2CC  Final   Culture   Final    NO GROWTH 5 DAYS Performed at [facility]    Report Status 07/23/2014 FINAL  Final  Urine culture     Status: None   Collection Time: 07/17/14  4:40 PM  Result Value Ref Range Status   Specimen Description URINE, CLEAN CATCH  Final   Special Requests NONE  Final   Colony Count NO GROWTH Performed at [facility]   Final   Culture NO GROWTH Performed at [facility]   Final   Report Status 07/18/2014 FINAL  Final  Culture, respiratory (NON-Expectorated)      Status: None   Collection Time: 07/17/14  5:00 PM  Result Value Ref Range Status   Specimen Description SPUTUM  Final   Special Requests NONE  Final   Gram Stain   Final    NO WBC SEEN RARE SQUAMOUS EPITHELIAL CELLS PRESENT FEW GRAM POSITIVE COCCI IN PAIRS RARE GRAM POSITIVE RODS Performed at [facility]    Culture   Final    NORMAL OROPHARYNGEAL FLORA Performed at [facility]    Report Status 07/20/2014 FINAL  Final  MRSA PCR Screening     Status: None   Collection Time: 07/22/14  4:22 PM  Result Value Ref Range Status   MRSA by PCR NEGATIVE NEGATIVE Final    Comment:        The GeneXpert MRSA Assay (FDA approved for NASAL specimens only), is one component of a comprehensive MRSA colonization surveillance program. It is not intended to diagnose MRSA infection nor to guide or monitor treatment for MRSA infections.     RADIOLOGY STUDIES/RESULTS: Ct Abdomen Wo Contrast  07/21/2014   CLINICAL DATA:  69 year old male with generalized weakness. Evaluate anatomy prior to gastrostomy tube placement. History of dysphagia.  EXAM: CT ABDOMEN WITHOUT CONTRAST  TECHNIQUE: Multidetector CT imaging of the abdomen was performed following the standard protocol without IV contrast.  COMPARISON:  No priors.  FINDINGS: Lower chest: Areas of airspace consolidation in the right middle lobe and right lower lobe. Small right pleural effusion  layering dependently. Areas of bronchiectasis and potential cavitation in the posterior aspect of the right lower lobe, poorly evaluated on today's noncontrast CT examination. 1.6 x 1.0 cm nodular density in the medial aspect of the left lower lobe (image 13 of series 5). Trace left pleural effusion. Atherosclerotic calcifications in the left anterior descending and right coronary arteries.  Hepatobiliary: No definite cystic or solid hepatic lesions are identified on today's noncontrast CT examination. Gallbladder is not confidently identified  and could be collapsed or surgically absent (no surgical clips are noted in the region of the gallbladder fossa).  Pancreas: Pancreas is poorly depicted, but grossly unremarkable.  Spleen: Unremarkable.  Adrenals/Urinary Tract: The unenhanced appearance of the kidneys and adrenal glands is normal bilaterally. No hydroureteronephrosis.  Stomach/Bowel: The unenhanced appearance of the visualized portions of the stomach is unremarkable. Importantly, however, there are loops of small bowel and colon superficial to the visualized portions of the stomach. No pathologic dilatation of the visualized portions of the small bowel or colon.  Vascular/Lymphatic: Atherosclerosis throughout the visualized abdominal vasculature, without definite aneurysm. No definite lymphadenopathy in the visualized portions of the abdomen on today's noncontrast CT examination.  Other: No significant volume of ascites or definite pneumoperitoneum identified in the abdomen.  Musculoskeletal: Chronic appearing compression fracture of L1 with approximately 20% loss of anterior vertebral body height. There are no aggressive appearing lytic or blastic lesions noted in the visualized portions of the skeleton.  IMPRESSION: 1. Stomach is incompletely visualized on today's examination. The visualized portions of the stomach are remarkable for superficial loops of small bowel and colon interposed between the stomach in the anterior abdominal wall. 2. Airspace consolidation in the right middle and lower lobes concerning for acute infection. This appears be associated with some areas of bronchiectasis and potential cavitation in the posterior aspect of the right lower lobe. There is also a small right parapneumonic pleural effusion. 3. Pleural-based 1.6 x 1.0 cm nodular density in the medial aspect of the left lower lobe. This could also be infectious or inflammatory in etiology, however, the possibility of neoplasm is not excluded, and close attention on  future followup examinations is recommended. There is also trace left pleural effusion. At this time, noncontrast chest CT is recommended in 2-3 weeks to assess for resolution of the right lower/middle lobe pneumonia following trial of antimicrobial therapy, and to reassess this left lower lobe nodular region. 4. Extensive atherosclerosis, including at least 2 vessel coronary artery disease. Please note that although the presence of coronary artery calcium documents the presence of coronary artery disease, the severity of this disease and any potential stenosis cannot be assessed on this non-gated CT examination. Assessment for potential risk factor modification, dietary therapy or pharmacologic therapy may be warranted, if clinically indicated. 5. Additional incidental findings, as above.   Electronically Signed   By: Trudie Reed M.D.   On: 07/21/2014 20:26   Ct Head Wo Contrast  07/19/2014   CLINICAL DATA:  Headache, hallucinating objects  EXAM: CT HEAD WITHOUT CONTRAST  TECHNIQUE: Contiguous axial images were obtained from the base of the skull through the vertex without intravenous contrast.  COMPARISON:  None  FINDINGS: There is no evidence of mass effect, midline shift, or extra-axial fluid collections. There is no evidence of a space-occupying lesion or intracranial hemorrhage. There is no evidence of a cortical-based area of acute infarction. 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 There is generalized cerebral atrophy. There is periventricular white matter low attenuation likely  secondary to microangiopathy.  The ventricles and sulci are appropriate for the patient's age. The basal cisterns are patent.  Visualized portions of the orbits are unremarkable. There is near complete opacification of the left maxillary sinus. There is an air-fluid level in the right sphenoid sinus. There is mild left ethmoid sinus mucosal thickening. Cerebrovascular atherosclerotic calcifications are noted.  The  osseous structures are unremarkable.  IMPRESSION: 1. No acute intracranial pathology. 2. Chronic microvascular disease and cerebral atrophy. 3. Right sphenoid and left maxillary sinus disease. Mild left ethmoid sinus disease.   Electronically Signed   By: Elige Ko   On: 07/19/2014 18:01   Pelvis Portable  07/20/2014   CLINICAL DATA:  69 year old status post left hip repair  EXAM: PORTABLE PELVIS 1-2 VIEWS  COMPARISON:  Preoperative radiographs 07/19/2014  FINDINGS: Interval ORIF of intertrochanteric left femoral fracture with intra medullary nail and a transfemoral neck gamma nail. Improved alignment of the fracture fragments. No evidence of acute hardware complication. Expected postoperative subcutaneous emphysema. Oral contrast material opacifies the colon. Scattered atherosclerotic vascular calcifications are noted.  IMPRESSION: ORIF left intertrochanteric femoral fracture without evidence of complication.   Electronically Signed   By: Malachy Moan M.D.   On: 07/20/2014 21:15   Ir Fluoro Rm 30-60 Min  07/23/2014   CLINICAL DATA:  Dysphagia, aspiration pneumonia. Request for enteral feeding support. Recent CT had demonstrated no safe percutaneous approach to the nondistended stomach due to overlying small bowel and colon.  EXAM: IR FLOURO RM 0-60 MIN  COMPARISON:  CT 07/21/2014  TECHNIQUE: Patient was positioned prone on the supine on the procedure table.  Intravenous Fentanyl and Versed were administered as conscious sedation during continuous cardiorespiratory monitoring by the radiology RN, with a total moderate sedation time of less than 30 minutes.  A 5 French angled angiographic catheter placed as orogastric tube to allow gastric insufflation with air. Under fluoroscopy, using multiple obliquities, it was apparent that the redundant transverse colon and loops of small bowel continued overlie the gastric body and antrum, such that no safe percutaneous approach was available for gastrostomy tube  placement.  IMPRESSION: 1. No safe percutaneous approach for gastrostomy tube placement due to overlying bowel. Consider surgical placement if needed.   Electronically Signed   By: Corlis Leak M.D.   On: 07/23/2014 08:10   Dg Chest Port 1 View  07/22/2014   CLINICAL DATA:  Right lower lobe infiltrate. Increasing shortness of breath and chest congestion.  EXAM: PORTABLE CHEST - 1 VIEW  COMPARISON:  07/17/2014  FINDINGS: Slight improvement in the pneumonia in the right lower lobe. New accentuation of the interstitial markings at the left lung base. Probable small bilateral pleural effusions superimposed on emphysema. Tortuosity of the thoracic aorta. Overall heart size is normal.  IMPRESSION: Slight improvement in right lower lobe pneumonia. Interstitial accentuation at the left lung base could represent pneumonitis. Is the patient aspirating? COPD. Probable small effusions.   Electronically Signed   By: Francene Boyers M.D.   On: 07/22/2014 07:40   Dg Chest Port 1 View  07/17/2014   CLINICAL DATA:  Pt found on kitchen floor. Believed to be there for approx 4 hrs. Sts he fell but sat himself down, wasn't a "crash." Complains of sob and congestion x3 days. Was too weak to stand up from fall. Has been taking mucinex. Per neighbor, he started coughing up blood clots yesterday.  EXAM: PORTABLE CHEST - 1 VIEW  COMPARISON:  04/27/2004  FINDINGS: There is dense consolidation  in knee right lower lung, projecting in the right lower lobe. This is new from the prior study.  Lungs are hyperexpanded consistent with underlying COPD. No other lung consolidation. No pulmonary edema. No pleural effusion or pneumothorax.  Cardiac silhouette is normal in size. Aorta is uncoiled. No mediastinal or hilar masses or convincing adenopathy.  Bony thorax is demineralized but intact.  IMPRESSION: Right lower lobe consolidation consistent with pneumonia. Underlying COPD.   Electronically Signed   By: Amie Portland M.D.   On: 07/17/2014 11:58     Dg Knee Left Port  07/19/2014   CLINICAL DATA:  Status post fall.  EXAM: PORTABLE LEFT KNEE - 1-2 VIEW  COMPARISON:  None.  FINDINGS: There is no evidence of fracture, dislocation, or joint effusion. There is generalized osteopenia. There is no evidence of arthropathy or other focal bone abnormality. Soft tissues are unremarkable. There is peripheral vascular atherosclerotic disease.  IMPRESSION: No acute osseous injury of the left knee.   Electronically Signed   By: Elige Ko   On: 07/19/2014 20:25   Dg Swallowing Func-speech Pathology  07/19/2014    Objective Swallowing Evaluation:    Patient Details  Name: ARBER DOOLING MRN: [id number] Date of Birth: 1945-04-07  Today's Date: 07/19/2014 Time: SLP Start Time (ACUTE ONLY): 1120-SLP Stop Time (ACUTE ONLY): 1155 SLP Time Calculation (min) (ACUTE ONLY): 35 min  Past Medical History:  Past Medical History  Diagnosis Date  . Inguinal hernia   . COPD (chronic obstructive pulmonary disease)   . GERD (gastroesophageal reflux disease)   . Chronic hoarseness   . Tobacco abuse   . Cervical spinal stenosis     C4-C5; C5-C6; severe stenosis and abnormal  cord signal C6-C7 s/p  decompression in 04/2004   Past Surgical History:  Past Surgical History  Procedure Laterality Date  . Inguinal hernia repair    . Posterior laminectomy / decompression cervical spine  2006   HPI:  Other Pertinent Information: Arias Uplinger is a 69 y.o. male, history of  COPD (not on oxygen), ongoing smoking, occasional alcohol use, history of  dysphagia on a soft diet per MBS in September 2014, chronic hoarse voice,  GERD, generalized weakness and deconditioning.  PSH + for cervical  decompression surgery in 2006.  Pt reported chronic problems with  hoarseness and dysphagia s/p cervical surgery.  He also experienced 30  pound weight loss within 3 years-pt reported in 2014.  He lives at home  alone and receives help from neighbors for getting groceries and  activities of daily living.  The MBS in  September 2014 did show trace  aspiration of thins and penetration of nectar liquids.  CXR showed right  lower lobe pna.    No Data Recorded  Assessment / Plan / Recommendation CHL IP CLINICAL IMPRESSIONS 07/19/2014  Therapy Diagnosis Severe pharyngeal phase dysphagia;Severe cervical  esophageal phase dysphagia;Moderate pharyngeal phase dysphagia;Moderate  cervical esophageal phase dysphagia  Clinical Impression Known chronic dysphagia with suspected acute  exacerbation.  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    Weakness in pharyngeal contraction and tongue base retraction/poor  epiglottic deflection results in gross pharyngeal residuals that mix with  secretions without adequate pt sensation.  With liquids, pt conducts  multiple reflexive swallows (x4 - with liquids) with extended breathhold  *presumed to be compensatory to help clear and prevent gross aspiration.   Poor hyolaryngeal elevation allows laryngeal penetration of liquids during  the swallow.  Liquid  swallows helpful to reduce solid food residuals in  pharynx.    Mild amount of aspiration (silent) after the swallow noted as barium  spills posterior into open larynx from pyriform sinus.   Pt did not sense  residuals today (mixed with secretions) but cued swallow, liquid follow  solids and cued expectoration (of secretions mixed with barium) effective  to clear 80%.    Per previous MBS in 2014 *Dr Bradly Chris, radiologist stated findings  consistent with esophagram from 08/2012 indicating narrowing of lower  cervical esophagus and dilated cervical esophagus.  SLP today noted area  near cervical hardware where barium pools slightly, clears with reflexive  swallows.   Mr Coke again reports desire to eat with known aspiration/malnutrition  risks.  Pt does admit to further weight loss of 5 pounds since 2014 and  states he has "good days and bad days" re: swallowing.  SLP  advised him  to monitor  swallowing/nutrition closely to maximize  hydration/nutrition/airway safety.  Pt readily states liquid are easier  for him to consume.    To respect pt wishes, recommend dys3/thin with precautions.  Pt will  require crushed or liquid medications due to level of dysphagia.  Using  live video, educated pt to findings, effective compensations.  Also  recommend pt have suction set up to help with his secretion management.           No flowsheet data found.   CHL IP DIET RECOMMENDATION 07/19/2014  SLP Diet Recommendations Dysphagia 3 (Mech soft);Thin  Liquid Administration via cup  Medication Administration Crushed with puree  Compensations Slow rate;Small sips/bites;Follow solids with  liquid;Multiple dry swallows after each bite/sip, cough and expectorate  Postural Changes and/or Swallow Maneuvers (None)     CHL IP OTHER RECOMMENDATIONS 07/19/2014  Recommended Consults (None)  Oral Care Recommendations Oral care QID  Other Recommendations (None)     CHL IP FOLLOW UP RECOMMENDATIONS 10/28/2012  Follow up Recommendations None     CHL IP FREQUENCY AND DURATION 07/19/2014  Speech Therapy Frequency (ACUTE ONLY) min 2x/week  Treatment Duration 1 week         CHL IP REASON FOR REFERRAL 07/19/2014  Reason for Referral Objectively evaluate swallowing function     CHL IP ORAL PHASE 07/19/2014                    Oral Phase WFL                     CHL IP PHARYNGEAL PHASE 07/19/2014  Pharyngeal Phase Impaired  Pharyngeal Comment multiple swallows across consistencies, following solid  with liquids helpful to decrease vallecular residuals, cough and  expectoration helpful, limited postural changes available due to cervical  decompression sx in 06      Donavan Burnet, MS [facility] SLP [phone]    Dg C-arm 1-60 Min  07/20/2014   CLINICAL DATA:  69 year old male with left intertrochanteric femoral fracture undergoing ORIF  EXAM: DG C-ARM 61-120 MIN; LEFT FEMUR 2 VIEWS  COMPARISON:  Preoperative radiographs 07/19/2014  FINDINGS: For intraoperative spot  radiographs demonstrate open reduction internal fixation of intertrochanteric femoral fracture with an intra medullary femoral nail and transfemoral neck gamma nail. No evidence of immediate hardware complication. There is a single distal interlocking screw.  IMPRESSION: ORIF of left intertrochanteric femoral fracture without evidence of immediate complication   Electronically Signed   By: Malachy Moan M.D.   On: 07/20/2014 20:39   Dg Hip Unilat With Pelvis 2-3  Views Left  07/19/2014   CLINICAL DATA:  Fall today with left hip pain, initial encounter  EXAM: LEFT HIP (WITH PELVIS) 2-3 VIEWS  COMPARISON:  None.  FINDINGS: There is an a minimally displaced intratrochanteric fracture of the proximal left femur. No dislocation is noted. Contrast material is seen from prior speech pathology examination. The pelvic ring is otherwise intact. No other focal abnormality is seen.  IMPRESSION: Mildly displaced left intratrochanteric femoral fracture.   Electronically Signed   By: Alcide Clever M.D.   On: 07/19/2014 17:34   Dg Femur Min 2 Views Left  07/20/2014   CLINICAL DATA:  Postop left hip fracture repair  EXAM: LEFT FEMUR 2 VIEWS  COMPARISON:  07/19/2014  FINDINGS: Internal fixation across the left intertrochanteric femoral fracture. Near anatomic alignment. No hardware or bony complicating feature.  IMPRESSION: Internal fixation across the left femoral intertrochanteric fracture. No complicating feature.   Electronically Signed   By: Charlett Nose M.D.   On: 07/20/2014 21:20   Dg Femur Min 2 Views Left  07/20/2014   CLINICAL DATA:  69 year old male with left intertrochanteric femoral fracture undergoing ORIF  EXAM: DG C-ARM 61-120 MIN; LEFT FEMUR 2 VIEWS  COMPARISON:  Preoperative radiographs 07/19/2014  FINDINGS: For intraoperative spot radiographs demonstrate open reduction internal fixation of intertrochanteric femoral fracture with an intra medullary femoral nail and transfemoral neck gamma nail. No evidence  of immediate hardware complication. There is a single distal interlocking screw.  IMPRESSION: ORIF of left intertrochanteric femoral fracture without evidence of immediate complication   Electronically Signed   By: Malachy Moan M.D.   On: 07/20/2014 20:39    Jeoffrey Massed, MD  [facility] Pager:[phone]  If 7PM-7AM, please contact night-coverage [email or web address] Password [email or web address] 07/27/2014, 10:12 AM   LOS: 10 days

## 2014-07-27 NOTE — Progress Notes (Signed)
Physical Therapy Treatment Patient Details Name: Phillip May MRN: [id number] DOB: Dec 18, 1945 Today's Date: 07/27/2014    History of Present Illness 69 yo male admitted 07/17/14 with Pna, fall. Hx of COPD, cervical spinal stenosis. Pt is from home alone. Pt s/p fall on 07/19/14 with L intertrochantric fracture and underwent IM nailing.    PT Comments    POD 7 L IM nail with extended hospital stay.  Pt AxO x 2.  Stated he lived alone and was amb with his cane.   Assisted OOB required + 2 assist and the use of a B Platform walker to attempt gait.    Follow Up Recommendations  SNF ([facility])     Equipment Recommendations       Recommendations for Other Services       Precautions / Restrictions Precautions Precautions: Fall Restrictions Weight Bearing Restrictions: No LLE Weight Bearing: Weight bearing as tolerated Other Position/Activity Restrictions: WBAT L LE    Mobility  Bed Mobility Overal bed mobility: Needs Assistance;+2 for physical assistance Bed Mobility: Rolling;Supine to Sit Rolling: +2 for physical assistance;Max assist   Supine to sit: +2 for physical assistance;Total assist     General bed mobility comments: Pt able to give much effort at all with supine <> sit transfers despite cues.  Use of bed pad to A with mobility.  Transfers Overall transfer level: Needs assistance Equipment used: Bilateral platform walker (EVA walker) Transfers: Sit to/from Stand Sit to Stand: +2 physical assistance;Total assist         General transfer comment: great difficulty using B UE's to assist.  B hands present with RA.  Assisted from bed to [facility] total assist + 2 partial turn completion.  Pt grasping onto BSC.    Ambulation/Gait Ambulation/Gait assistance: +2 safety/equipment;+2 physical assistance;Max assist Ambulation Distance (Feet): 3 Feet Assistive device: Bilateral platform walker (EVA walker) Gait Pattern/deviations: Step-to pattern;Decreased step length -  right;Decreased step length - left;Decreased stance time - left Gait velocity: decreased   General Gait Details: great difficulty supporting self and inability to tolerate WBing thru L LE.  Great diffuculty advacing gait past 3 feet.  Pt repeated, "I got to sit".    Stairs            Wheelchair Mobility    Modified Rankin (Stroke Patients Only)       Balance                                    Cognition Arousal/Alertness: Awake/alert Behavior During Therapy: WFL for tasks assessed/performed Overall Cognitive Status: Impaired/Different from baseline Area of Impairment: Safety/judgement;Problem solving                    Exercises      General Comments        Pertinent Vitals/Pain Pain Assessment: Faces Faces Pain Scale: Hurts even more Pain Location: B knees Pain Descriptors / Indicators: Grimacing Pain Intervention(s): Monitored during session;Repositioned    Home Living                      Prior Function            PT Goals (current goals can now be found in the care plan section) Progress towards PT goals: Progressing toward goals    Frequency  Min 3X/week    PT Plan      Co-evaluation  End of Session Equipment Utilized During Treatment: Gait belt Activity Tolerance: Patient limited by fatigue Patient left: in chair;with call bell/phone within reach     Time: 1358-1425 PT Time Calculation (min) (ACUTE ONLY): 27 min  Charges:  $Gait Training: 8-22 mins $Therapeutic Activity: 8-22 mins                    G Codes:      Felecia Shelling  PTA [facility]  Acute  Rehab Pager      [phone]

## 2014-07-27 NOTE — Progress Notes (Addendum)
Patient ID: Phillip May, male   DOB: 1945-10-10, 69 y.o.   MRN: [id number] 1 Day Post-Op  Subjective: Pt with no new complaints today.  Says he is agreeable to g-tube, but he is getting a swallow test today  Objective: Vital signs in last 24 hours: Temp:  [97.9 F (36.6 C)-98.1 F (36.7 C)] 98.1 F (36.7 C) (06/14 0502) Pulse Rate:  [64-88] 84 (06/14 0921) Resp:  [18] 18 (06/14 0502) BP: (162-176)/(86-99) 162/86 mmHg (06/14 0921) SpO2:  [93 %-100 %] 93 % (06/14 0934) Last BM Date: 07/25/14  Intake/Output from previous day: 06/13 0701 - 06/14 0700 In: -  Out: 1201 [Urine:1200; Stool:1] Intake/Output this shift:    PE: Abd: soft, minimal upper abdominal tenderness, +BS, cachetic  Lab Results:   Recent Labs  07/25/14 0444 07/26/14 0440  WBC  --  7.7  HGB 10.1* 10.5*  HCT 29.1* 31.0*  PLT  --  336   BMET  Recent Labs  07/27/14 0441  CREATININE 0.64   PT/INR No results for input(s): LABPROT, INR in the last 72 hours. CMP     Component Value Date/Time   NA 135 07/24/2014 0930   K 3.6 07/24/2014 0930   CL 96* 07/24/2014 0930   CO2 30 07/24/2014 0930   GLUCOSE 99 07/24/2014 0930   BUN 8 07/24/2014 0930   CREATININE 0.64 07/27/2014 0441   CALCIUM 8.2* 07/24/2014 0930   PROT 7.2 07/17/2014 1206   ALBUMIN 3.0* 07/17/2014 1206   AST 26 07/17/2014 1206   ALT 18 07/17/2014 1206   ALKPHOS 76 07/17/2014 1206   BILITOT 0.8 07/17/2014 1206   GFRNONAA >60 07/27/2014 0441   GFRAA >60 07/27/2014 0441   Lipase  No results found for: LIPASE     Studies/Results: No results found.  Anti-infectives: Anti-infectives    Start     Dose/Rate Route Frequency Ordered Stop   07/26/14 0000  metroNIDAZOLE (FLAGYL) IVPB 500 mg    Comments:  Pharmacy may adjust dosing strength, interval, or rate of medication as needed for optimal therapy for the patient Send with patient on call to the OR.  Anesthesia to complete antibiotic administration <52min prior to incision per  [facility].   500 mg 100 mL/hr over 60 Minutes Intravenous 30 min pre-op 07/25/14 1043     07/21/14 0000  ceFAZolin (ANCEF) IVPB 2 g/50 mL premix     2 g 100 mL/hr over 30 Minutes Intravenous Every 6 hours 07/20/14 2139 07/21/14 0646   07/20/14 1000  ceFAZolin (ANCEF) IVPB 2 g/50 mL premix     2 g 100 mL/hr over 30 Minutes Intravenous  Once 07/19/14 2133 07/20/14 1833   07/19/14 1000  ampicillin-sulbactam (UNASYN) 1.5 g in sodium chloride 0.9 % 50 mL IVPB  Status:  Discontinued     1.5 g 100 mL/hr over 30 Minutes Intravenous Every 6 hours 07/19/14 0812 07/26/14 1027   07/18/14 1400  azithromycin (ZITHROMAX) 500 mg in dextrose 5 % 250 mL IVPB  Status:  Discontinued     500 mg 250 mL/hr over 60 Minutes Intravenous Every 24 hours 07/17/14 1612 07/23/14 0841   07/18/14 0200  ampicillin-sulbactam (UNASYN) 1.5 g in sodium chloride 0.9 % 50 mL IVPB  Status:  Discontinued     1.5 g 100 mL/hr over 30 Minutes Intravenous Every 8 hours 07/17/14 1621 07/19/14 0812   07/18/14 0000  azithromycin (ZITHROMAX) 500 mg in dextrose 5 % 250 mL IVPB  Status:  Discontinued  500 mg 250 mL/hr over 60 Minutes Intravenous Every 24 hours 07/17/14 1344 07/17/14 1612   07/17/14 1500  Ampicillin-Sulbactam (UNASYN) 3 g in sodium chloride 0.9 % 100 mL IVPB     3 g 100 mL/hr over 60 Minutes Intravenous  Once 07/17/14 1432 07/17/14 1900   07/17/14 1230  cefTRIAXone (ROCEPHIN) 1 g in dextrose 5 % 50 mL IVPB  Status:  Discontinued     1 g 100 mL/hr over 30 Minutes Intravenous  Once 07/17/14 1219 07/17/14 1416   07/17/14 1230  azithromycin (ZITHROMAX) 500 mg in dextrose 5 % 250 mL IVPB     500 mg 250 mL/hr over 60 Minutes Intravenous  Once 07/17/14 1219 07/17/14 1550       Assessment/Plan   1. Chronic dysphagia -swallow study today, if fails then we will plan on proceeding with placement of a surgical g-tube tomorrow.  D/w the patient.  2.  Left hip fracture from fall in hospital - repaired by Dr. Linna Caprice on  07/20/2014. 3.  COPD   Followed by Dr. Marchelle Gearing  Pt was admitted 07/17/2014 for acute on chronic respiratory failure - question aspiration contributing. 4.  Malnourished   LOS: 10 days   OSBORNE,KELLY E 07/27/2014, 9:40 AM Pager: [phone]  Agree with above. Plan PEG or lap assisted gastrostomy tube tomorrow. The patient is still vacillating about the surgery, but for now wants to go ahead.  He has asked repetitive questions about the procedure.  He is badly deconditioned and bed bound.  Plan dysphagia 3 diet for "oral comfort".  Ovidio Kin, MD, [facility] Pager: [phone] Office phone:  [phone]

## 2014-07-27 NOTE — Progress Notes (Signed)
OT Cancellation Note  Patient Details Name: Phillip May MRN: [id number] DOB: 26-Aug-1945   Cancelled Treatment:    Reason Eval/Treat Not Completed: Other (comment) Pt declined OOB currently. He states he is tired. Encouraged even briefly sitting EOB but pt still stating, "not now." Note plan for feeding tube placement.  Lennox Laity  [phone] 07/27/2014, 1:21 PM

## 2014-07-27 NOTE — Progress Notes (Signed)
CSW continuing to follow.   Per MD, pt not yet medically ready for discharge today. Pt planned for PEG placement tomorrow.   CSW updated [facility] and facility stated that they can no longer continue to hold pt a bed and pt admission to [facility] would depend on availability when pt medically ready for discharge.   CSW contacted pt daughter, Phillip May via telephone. CSW re-introduced self and explained role. CSW discussed with pt daughter regarding [facility] and that facility can not continue to hold a bed and have not taken away their bed offer, but the bed offer is dependent upon availability when pt medically ready for discharge. CSW discussed with pt daughter that it will be beneficial to initiate SNF search in order to have secondary options available in case [facility] does not have a bed available when pt medically ready for d/c. Pt daughter expressed understanding and is agreeable to re-initiating [facility] search. Pt daughter remains hopeful for [facility] as pt daughter has already completed admission paperwork at the facility and taken some of pt clothing to the facility.  CSW updated FL2 and reinitiated SNF search to [facility]. CSW updated [facility].   CSW to continue to follow to provide support and assist with pt disposition needs.   Loletta Specter, MSW, LCSW Clinical Social Work [phone]

## 2014-07-27 NOTE — Progress Notes (Signed)
CHL IP CLINICAL IMPRESSIONS 07/27/2014  Therapy Diagnosis Severe pharyngeal phase dysphagia;Mild cervical esophageal phase dysphagia;Moderate pharyngeal phase dysphagia;Moderate cervical esophageal phase dysphagia  Clinical Impression Cough noted with expectoration of copious secretions after dry swallow indicative of swallow ability.    Pt continues with moderately severe sensorimotor pharyngo-cervical esophageal dysphagia without significant improvement since testing earlier in this admission.  Weakness in pharyngeal contraction and tongue base retraction/poor epiglottic deflection continues resulting in gross pharyngeal residuals without adequate pt sensation.  With liquids pt conducts multiple reflexive swallows (x4 - with liquids) with extended breathhold *presumed to be compensatory to help clear and prevent gross aspiration.  Poor hyolaryngeal elevation allows laryngeal penetration of liquids during the swallow.  Mild amount of aspiration after the swallow noted as barium spills posteior into open larynx from pyriform sinus.   Pt did not consistently sense residuals today (mixed with secretions) but cued swallow, liquid follow solids and cued expectoration (of secretions mixed with barium) effective to clear 80%.     Using live video, educated pt to findings, effective compensations.  Also recommend continue with suction to help pt with his secretion management.     Pt again expresses desire for po with risks known and pursuing feeding tube for nutritional support only.  Pt understands feeding tube will not prevent aspiration but dysphagia prevents him from obtaining adequate nutrition via po. Recommend consider dys3/thin with strict precautions and KNOWN aspiration risks per pt wishes.  Pt will require crushed or liquid medications due to level of dysphagia.       No flowsheet data found.  CHL IP DIET RECOMMENDATION 07/27/2014  SLP Diet Recommendations Dysphagia 3 (Mech soft);Thin for comfort  only  Liquid Administration via Straw,cup  Medication Administration Crushed with puree  Compensations Slow rate;Small sips/bites;Follow solids with liquid;Multiple dry swallows after each bite/sip, cough and expectoration t/o meal as needed  Postural Changes and/or Swallow Maneuvers Stay upright 30 minutes after meals     Donavan Burnet, MS [facility] SLP [phone]

## 2014-07-27 NOTE — Clinical Social Work Placement (Signed)
   CLINICAL SOCIAL WORK PLACEMENT  NOTE  Date:  07/27/2014  Patient Details  Name: Phillip May MRN: [id number] Date of Birth: 1945-02-16  Clinical Social Work is seeking post-discharge placement for this patient at the Skilled  Nursing Facility level of care (*CSW will initial, date and re-position this form in  chart as items are completed):  Yes   Patient/family provided with [facility] Clinical Social Work Department's list of facilities offering this level of care within the geographic area requested by the patient (or if unable, by the patient's family).  Yes   Patient/family informed of their freedom to choose among providers that offer the needed level of care, that participate in Medicare, Medicaid or managed care program needed by the patient, have an available bed and are willing to accept the patient.  Yes   Patient/family informed of [facility]'s ownership interest in [facility] and [facility], as well as of the fact that they are under no obligation to receive care at these facilities.  PASRR submitted to EDS on 07/18/14     PASRR number received on 07/18/14     Existing PASRR number confirmed on       FL2 transmitted to all facilities in geographic area requested by pt/family on 07/18/14     FL2 transmitted to all facilities within larger geographic area on 07/27/14     Patient informed that his/her managed care company has contracts with or will negotiate with certain facilities, including the following:        Yes   Patient/family informed of bed offers received.  Patient chooses bed at [facility]     Physician recommends and patient chooses bed at      Patient to be transferred to   on  .  Patient to be transferred to facility by       Patient family notified on   of transfer.  Name of family member notified:        PHYSICIAN Please sign FL2     Additional Comment:     _______________________________________________ Orson Eva, LCSW 07/27/2014, 2:34 PM

## 2014-07-27 NOTE — Progress Notes (Signed)
Nutrition Follow-up  DOCUMENTATION CODES:  Severe malnutrition in context of chronic illness, Underweight  INTERVENTION: - Continue NPO per SLP recommendations - When PEG placed, recommend Osmolite 1.2 @ 50 mL/hr to provide 1440 kcal, 67 grams of protein, and 984 mL free water. Initiate @ 10 mL/hr and advance by 10 mL Q8h due to risk of refeeding - RD will continue to monitor for needs  NUTRITION DIAGNOSIS:  Malnutrition related to chronic illness as evidenced by severe depletion of body fat, severe depletion of muscle mass. -ongoing  GOAL:  Patient will meet greater than or equal to 90% of their needs -unmet  MONITOR:  TF tolerance, Weight trends, Labs, I & O's  ASSESSMENT: 69 y.o. male, history of COPD follows with Dr. Marchelle Gearing not on oxygen, ongoing smoking, occasional alcohol use, history of dysphagia on a soft diet per speech therapy eval in 2014, chronic hoarse voice, GERD, generalized weakness and deconditioning.    6/14:  PEG was to be placed by surgery yesterday, but per RN note yesterday, "family would like to delay surgery until they have had a chance to discuss the situation further." SLP note yesterday recommended pt be NPO. Per surgery note this AM, pt "says he is agreeable to G-tube, but he is getting a swallow test today."  Pt unable to meet needs. TF recommendations as outlined above. Medications reviewed. Labs reviewed; Cl: 96 mmol/L.  6/10: - Pt admitted with aspiration/CAP.  - Pt remains on CIWA protocol. - Left hip fx due to fall.  - SLP following- NPO except meds. Pt now wishes for peg. - Unable to be placed by IR. Surgery consulted- pt is candidate.  - Pt is at risk for refeeding given severe malnutrition and poor po.  - RD will continue to monitor for peg/g-tube placement and initiation of TF.  Height:  Ht Readings from Last 1 Encounters:  07/23/14 5\' 7"  (1.702 m)    Weight:  Wt Readings from Last 1 Encounters:  07/23/14 105 lb 2.6 oz  (47.7 kg)    Ideal Body Weight:  67.2 kg  Wt Readings from Last 10 Encounters:  07/23/14 105 lb 2.6 oz (47.7 kg)  05/21/11 126 lb (57.153 kg)  04/23/11 131 lb (59.421 kg)  03/13/11 128 lb 9.6 oz (58.333 kg)    BMI:  Body mass index is 16.47 kg/(m^2).  Estimated Nutritional Needs:  Kcal:  1300-1500  Protein:  65-75g  Fluid:  1.5L/day  Skin:  Wound (see comment) (closed incision on left thigh)  Diet Order:  Diet NPO time specified  EDUCATION NEEDS:  No education needs identified at this time   Intake/Output Summary (Last 24 hours) at 07/27/14 0945 Last data filed at 07/27/14 0610  Gross per 24 hour  Intake      0 ml  Output   1201 ml  Net  -1201 ml    Last BM:  6/14   Trenton Gammon, RD, LDN Inpatient Clinical Dietitian Pager # [phone] After hours/weekend pager # [phone]

## 2014-07-28 ENCOUNTER — Inpatient Hospital Stay (HOSPITAL_COMMUNITY): Payer: Medicare Other | Admitting: Certified Registered"

## 2014-07-28 ENCOUNTER — Encounter (HOSPITAL_COMMUNITY)
Admission: EM | Disposition: A | Discharge status: Skilled Nursing Facility | Payer: Self-pay | Source: Home / Self Care | Attending: Internal Medicine

## 2014-07-28 ENCOUNTER — Encounter (HOSPITAL_COMMUNITY): Payer: Self-pay

## 2014-07-28 HISTORY — PX: LAPAROSCOPY: SHX197

## 2014-07-28 HISTORY — PX: PEG PLACEMENT: SHX5437

## 2014-07-28 SURGERY — LAPAROSCOPY, DIAGNOSTIC
Anesthesia: General | Site: Abdomen

## 2014-07-28 MED ORDER — LIDOCAINE HCL (CARDIAC) 20 MG/ML IV SOLN
INTRAVENOUS | Status: AC
  Filled 2014-07-28: qty 5

## 2014-07-28 MED ORDER — NEOSTIGMINE METHYLSULFATE 10 MG/10ML IV SOLN
INTRAVENOUS | Status: DC | PRN
  Administered 2014-07-28: 4 mg via INTRAVENOUS

## 2014-07-28 MED ORDER — SUCCINYLCHOLINE CHLORIDE 20 MG/ML IJ SOLN
INTRAMUSCULAR | Status: DC | PRN
  Administered 2014-07-28: 100 mg via INTRAVENOUS

## 2014-07-28 MED ORDER — KCL IN DEXTROSE-NACL 20-5-0.45 MEQ/L-%-% IV SOLN
INTRAVENOUS | Status: DC
  Administered 2014-07-28 – 2014-07-29 (×4): via INTRAVENOUS
  Filled 2014-07-28 (×6): qty 1000

## 2014-07-28 MED ORDER — 0.9 % SODIUM CHLORIDE (POUR BTL) OPTIME
TOPICAL | Status: DC | PRN
  Administered 2014-07-28: 1000 mL

## 2014-07-28 MED ORDER — LACTATED RINGERS IR SOLN
Status: DC | PRN
  Administered 2014-07-28: 1000 mL

## 2014-07-28 MED ORDER — FENTANYL CITRATE (PF) 100 MCG/2ML IJ SOLN
INTRAMUSCULAR | Status: DC | PRN
  Administered 2014-07-28 (×3): 50 ug via INTRAVENOUS

## 2014-07-28 MED ORDER — PHENYLEPHRINE HCL 10 MG/ML IJ SOLN
INTRAMUSCULAR | Status: DC | PRN
  Administered 2014-07-28 (×5): 80 ug via INTRAVENOUS

## 2014-07-28 MED ORDER — BUPIVACAINE HCL (PF) 0.25 % IJ SOLN
INTRAMUSCULAR | Status: AC
  Filled 2014-07-28: qty 30

## 2014-07-28 MED ORDER — LIDOCAINE HCL (CARDIAC) 20 MG/ML IV SOLN
INTRAVENOUS | Status: DC | PRN
  Administered 2014-07-28: 50 mg via INTRAVENOUS

## 2014-07-28 MED ORDER — ROCURONIUM BROMIDE 100 MG/10ML IV SOLN
INTRAVENOUS | Status: AC
  Filled 2014-07-28: qty 1

## 2014-07-28 MED ORDER — NEOSTIGMINE METHYLSULFATE 10 MG/10ML IV SOLN
INTRAVENOUS | Status: AC
  Filled 2014-07-28: qty 1

## 2014-07-28 MED ORDER — PROPOFOL 10 MG/ML IV BOLUS
INTRAVENOUS | Status: AC
  Filled 2014-07-28: qty 20

## 2014-07-28 MED ORDER — PHENYLEPHRINE HCL 10 MG/ML IJ SOLN
10.0000 mg | INTRAVENOUS | Status: DC | PRN
  Administered 2014-07-28: 15 ug/min via INTRAVENOUS

## 2014-07-28 MED ORDER — MIDAZOLAM HCL 5 MG/5ML IJ SOLN
INTRAMUSCULAR | Status: DC | PRN
  Administered 2014-07-28: 1 mg via INTRAVENOUS

## 2014-07-28 MED ORDER — ONDANSETRON HCL 4 MG/2ML IJ SOLN
INTRAMUSCULAR | Status: DC | PRN
  Administered 2014-07-28: 4 mg via INTRAVENOUS

## 2014-07-28 MED ORDER — BUPIVACAINE HCL (PF) 0.25 % IJ SOLN
INTRAMUSCULAR | Status: DC | PRN
  Administered 2014-07-28: 5 mL

## 2014-07-28 MED ORDER — OXYCODONE HCL 5 MG PO TABS: 5.0000 mg | ORAL_TABLET | Freq: Once | ORAL | Status: DC | PRN

## 2014-07-28 MED ORDER — MORPHINE SULFATE 2 MG/ML IJ SOLN
1.0000 mg | INTRAMUSCULAR | Status: DC | PRN
  Administered 2014-07-29 – 2014-07-30 (×2): 2 mg via INTRAVENOUS
  Administered 2014-07-30: 3 mg via INTRAVENOUS
  Filled 2014-07-28 (×3): qty 1
  Filled 2014-07-28: qty 2

## 2014-07-28 MED ORDER — PROPOFOL 10 MG/ML IV BOLUS
INTRAVENOUS | Status: DC | PRN
  Administered 2014-07-28: 120 mg via INTRAVENOUS

## 2014-07-28 MED ORDER — CEFAZOLIN SODIUM-DEXTROSE 2-3 GM-% IV SOLR
2.0000 g | INTRAVENOUS | Status: AC
  Administered 2014-07-28: 2 g via INTRAVENOUS

## 2014-07-28 MED ORDER — ONDANSETRON HCL 4 MG/2ML IJ SOLN
INTRAMUSCULAR | Status: AC
  Filled 2014-07-28: qty 2

## 2014-07-28 MED ORDER — MIDAZOLAM HCL 2 MG/2ML IJ SOLN
INTRAMUSCULAR | Status: AC
  Filled 2014-07-28: qty 2

## 2014-07-28 MED ORDER — BUPIVACAINE HCL (PF) 0.5 % IJ SOLN
INTRAMUSCULAR | Status: AC
  Filled 2014-07-28: qty 30

## 2014-07-28 MED ORDER — CEFAZOLIN SODIUM-DEXTROSE 2-3 GM-% IV SOLR
INTRAVENOUS | Status: AC
  Filled 2014-07-28: qty 50

## 2014-07-28 MED ORDER — ROCURONIUM BROMIDE 100 MG/10ML IV SOLN
INTRAVENOUS | Status: DC | PRN
  Administered 2014-07-28: 5 mg via INTRAVENOUS
  Administered 2014-07-28: 10 mg via INTRAVENOUS
  Administered 2014-07-28: 15 mg via INTRAVENOUS

## 2014-07-28 MED ORDER — ONDANSETRON HCL 4 MG/2ML IJ SOLN: 4.0000 mg | Freq: Four times a day (QID) | INTRAMUSCULAR | Status: DC | PRN

## 2014-07-28 MED ORDER — LACTATED RINGERS IV SOLN
INTRAVENOUS | Status: DC
  Administered 2014-07-28: 1000 mL via INTRAVENOUS
  Administered 2014-07-28: 10:00:00 via INTRAVENOUS

## 2014-07-28 MED ORDER — PHENYLEPHRINE HCL 10 MG/ML IJ SOLN
INTRAMUSCULAR | Status: AC
  Filled 2014-07-28: qty 1

## 2014-07-28 MED ORDER — GLYCOPYRROLATE 0.2 MG/ML IJ SOLN
INTRAMUSCULAR | Status: DC | PRN
  Administered 2014-07-28: 0.6 mg via INTRAVENOUS

## 2014-07-28 MED ORDER — FENTANYL CITRATE (PF) 100 MCG/2ML IJ SOLN: 25.0000 ug | INTRAMUSCULAR | Status: DC | PRN

## 2014-07-28 MED ORDER — OXYCODONE HCL 5 MG/5ML PO SOLN
5.0000 mg | Freq: Once | ORAL | Status: DC | PRN
  Filled 2014-07-28: qty 5

## 2014-07-28 MED ORDER — GLYCOPYRROLATE 0.2 MG/ML IJ SOLN
INTRAMUSCULAR | Status: AC
  Filled 2014-07-28: qty 3

## 2014-07-28 MED ORDER — FENTANYL CITRATE (PF) 250 MCG/5ML IJ SOLN
INTRAMUSCULAR | Status: AC
Start: 2014-07-28 — End: 2014-07-28
  Filled 2014-07-28: qty 5

## 2014-07-28 MED ORDER — PHENYLEPHRINE 40 MCG/ML (10ML) SYRINGE FOR IV PUSH (FOR BLOOD PRESSURE SUPPORT)
PREFILLED_SYRINGE | INTRAVENOUS | Status: AC
  Filled 2014-07-28: qty 20

## 2014-07-28 SURGICAL SUPPLY — 34 items
APL SKNCLS STERI-STRIP NONHPOA (GAUZE/BANDAGES/DRESSINGS)
BENZOIN TINCTURE PRP APPL 2/3 (GAUZE/BANDAGES/DRESSINGS) IMPLANT
CLOSURE WOUND 1/2 X4 (GAUZE/BANDAGES/DRESSINGS)
DECANTER SPIKE VIAL GLASS SM (MISCELLANEOUS) IMPLANT
DRAPE LAPAROSCOPIC ABDOMINAL (DRAPES) ×4 IMPLANT
ELECT REM PT RETURN 9FT ADLT (ELECTROSURGICAL) ×4
ELECTRODE REM PT RTRN 9FT ADLT (ELECTROSURGICAL) ×2 IMPLANT
GLOVE BIOGEL PI IND STRL 7.0 (GLOVE) ×2 IMPLANT
GLOVE BIOGEL PI INDICATOR 7.0 (GLOVE) ×2
GLOVE SURG SIGNA 7.5 PF LTX (GLOVE) ×4 IMPLANT
GOWN SPEC L4 XLG W/TWL (GOWN DISPOSABLE) ×4 IMPLANT
GOWN STRL REUS W/ TWL XL LVL3 (GOWN DISPOSABLE) ×6 IMPLANT
GOWN STRL REUS W/TWL LRG LVL3 (GOWN DISPOSABLE) ×4 IMPLANT
GOWN STRL REUS W/TWL XL LVL3 (GOWN DISPOSABLE) ×12
KIT BASIN OR (CUSTOM PROCEDURE TRAY) ×4 IMPLANT
LIQUID BAND (GAUZE/BANDAGES/DRESSINGS) ×2 IMPLANT
SCISSORS LAP 5X35 DISP (ENDOMECHANICALS) ×2 IMPLANT
SET IRRIG TUBING LAPAROSCOPIC (IRRIGATION / IRRIGATOR) IMPLANT
SHEARS HARMONIC ACE PLUS 36CM (ENDOMECHANICALS) IMPLANT
SOLUTION ANTI FOG 6CC (MISCELLANEOUS) ×2 IMPLANT
STRIP CLOSURE SKIN 1/2X4 (GAUZE/BANDAGES/DRESSINGS) IMPLANT
SUT ETHILON 2 0 PS N (SUTURE) ×2 IMPLANT
SUT VIC AB 4-0 PS2 27 (SUTURE) IMPLANT
TAPE CLOTH SURG 4X10 WHT LF (GAUZE/BANDAGES/DRESSINGS) ×2 IMPLANT
TOWEL OR 17X26 10 PK STRL BLUE (TOWEL DISPOSABLE) ×4 IMPLANT
TRAY FOLEY W/METER SILVER 14FR (SET/KITS/TRAYS/PACK) IMPLANT
TRAY LAPAROSCOPIC (CUSTOM PROCEDURE TRAY) ×4 IMPLANT
TROCAR BLADELESS OPT 5 75 (ENDOMECHANICALS) ×2 IMPLANT
TROCAR SLEEVE XCEL 5X75 (ENDOMECHANICALS) ×2 IMPLANT
TROCAR XCEL BLUNT TIP 100MML (ENDOMECHANICALS) ×4 IMPLANT
TROCAR XCEL NON-BLD 11X100MML (ENDOMECHANICALS) IMPLANT
TROCAR XCEL UNIV SLVE 11M 100M (ENDOMECHANICALS) IMPLANT
TUBING INSUFFLATION 10FT LAP (TUBING) ×4 IMPLANT
WATER STERILE IRR 1500ML POUR (IV SOLUTION) ×2 IMPLANT

## 2014-07-28 NOTE — Transfer of Care (Signed)
Immediate Anesthesia Transfer of Care Note  Patient: Phillip May  Procedure(s) Performed: Procedure(s): LAPAROSCOPY DIAGNOSTIC (N/A) PERCUTANEOUS ENDOSCOPIC GASTROSTOMY (PEG) PLACEMENT  Patient Location: PACU  Anesthesia Type:General  Level of Consciousness: awake, alert  and oriented  Airway & Oxygen Therapy: Patient Spontanous Breathing and Patient connected to face mask oxygen  Post-op Assessment: Report given to RN and Post -op Vital signs reviewed and stable  Post vital signs: Reviewed and stable  Last Vitals:  Filed Vitals:   07/28/14 0444  BP: 125/77  Pulse: 76  Temp: 36.8 C  Resp: 18    Complications: No apparent anesthesia complications

## 2014-07-28 NOTE — Progress Notes (Signed)
OT Cancellation Note  Patient Details Name: Phillip May MRN: [id number] DOB: 1945-07-17   Cancelled Treatment:    Reason Eval/Treat Not Completed: Patient at procedure or test/ unavailable  [name] 07/28/2014, 9:26 AM  Marica Otter, OTR/L [phone] 07/28/2014

## 2014-07-28 NOTE — Progress Notes (Signed)
PATIENT DETAILS Name: Phillip May Age: 69 y.o. Sex: male Date of Birth: 12-Dec-1945 Admit Date: 07/17/2014 Admitting Physician Leroy Sea, MD ZOX:[id number] DAVIDSON, MD  Brief narrative  69 year old male with history of COPD, EtOH use, tobacco abuse, chronic dysphagia, GERD who lives at home by himself with the help of neighbors was found on the floor on the day of admission and brought to the emergency room. Found to have acute on chronic respiratory failure likely secondary to presumed aspiration pneumonia, patient was subsequently admitted to the hospital. Hospital course has been complicated by development of delirium which resulted in a fall causing a left hip fracture. During this hospital stay, patient was seen by speech therapy and found to have significant dysphagia and was kept nothing by mouth. Unfortunately Dysphagia has not improved, after discussion with patient, PEG tube placement was planned, because of the anatomy this was not able to be done by interventional radiology, general surgery planned PEG tube placement on 6/13-however patient/family were hesistant to and was rescheduled for 07/28/14  Subjective: No major issues. Just came back to the room from having laparoscopic PEG tube placement.  Assessment/Plan: Principal Problem: Presumed aspiration pneumonia: Has completed a course of Unasyn- discontinued on 6/13. Remains afebrile and nontoxic looking.  Active Problems: Acute on chronic respiratory failure: Secondary to above. Continue nothing by mouth-await placement of PEG tube. Both family (spoke to daughter and son 6/13) and patient aware that patient will continue to have aspiration risk with or without peg tube.  Left hip fracture: Occurred on 6/7 after sustaining a mechanical fall while delirious in the hospital. Orthopedics consulted, underwent ORIF on 07/20/2014. On Lovenox for DVT prophylaxis. Hospital course was complicated by development  of perioperative blood loss anemia requiring 1 unit of PRBC on 6/10. Hemoglobin currently stable.  Chronic dysphagia: See above-underwent SLP evaluation recommendations were NPO.Peg tube placement atempted by IR 07/22/2014-however unsuccessful due to overlying small bowel, consulted general surgery on 07/23/2014, was due for PEG tube placement 6/13-however patient and family were hesitant. I spoke with patient, son and daughter 6/13 after much discussion-family has  decided to go ahead with peg tube placement. Subsequently underwent laparoscopic PEG tube placement on 07/28/14. Had a repeat SLP evaluation on 6/14, patient and family would like to continue with some minimal oral intake for comfort. Family and patient are aware of risks and accepting all risks-there with that this could lead to life-threatening/disabling pneumonia,sepsis choking, respiratory failure and death.  Hypertension: Controlled  Amlodipine and Bystolic  Sepsis: Secondary to pneumonia. Has resolved with IV fluids and antibiotics.  History of COPD: stable with clear lungs. No current bronchodilators regimen.  Tobacco abuse: Counseled extensively.  Alcohol abuse with withdrawal: No signs of withdrawal currently. He remains on Ativan per protocol.  Anemia: Multifactorial-suspect has anemia of chronic disease at baseline, worsened by perioperative blood loss following left hip repair. Hemoglobin currently stable. Monitor periodically.  Severe Protein-calorie malnutrition: Continue supplements  Disposition: Remain inpatient-SNF ?6/16 if ok with [facility]CS  Antimicrobial agents  See below  Anti-infectives    Start     Dose/Rate Route Frequency Ordered Stop   07/28/14 0809  [MAR Hold]  ceFAZolin (ANCEF) IVPB 2 g/50 mL premix     (MAR Hold since 07/28/14 0810)   2 g 100 mL/hr over 30 Minutes Intravenous 30 min pre-op 07/28/14 0735 07/28/14 0923   07/26/14 0000  metroNIDAZOLE (FLAGYL) IVPB 500 mg  Status:  Discontinued    Comments:   Pharmacy may adjust dosing strength, interval, or rate of medication as needed for optimal therapy for the patient Send with patient on call to the OR.  Anesthesia to complete antibiotic administration <41min prior to incision per [facility].   500 mg 100 mL/hr over 60 Minutes Intravenous 30 min pre-op 07/25/14 1043 07/28/14 0735   07/21/14 0000  ceFAZolin (ANCEF) IVPB 2 g/50 mL premix     2 g 100 mL/hr over 30 Minutes Intravenous Every 6 hours 07/20/14 2139 07/21/14 0646   07/20/14 1000  ceFAZolin (ANCEF) IVPB 2 g/50 mL premix     2 g 100 mL/hr over 30 Minutes Intravenous  Once 07/19/14 2133 07/20/14 1833   07/19/14 1000  ampicillin-sulbactam (UNASYN) 1.5 g in sodium chloride 0.9 % 50 mL IVPB  Status:  Discontinued     1.5 g 100 mL/hr over 30 Minutes Intravenous Every 6 hours 07/19/14 0812 07/26/14 1027   07/18/14 1400  azithromycin (ZITHROMAX) 500 mg in dextrose 5 % 250 mL IVPB  Status:  Discontinued     500 mg 250 mL/hr over 60 Minutes Intravenous Every 24 hours 07/17/14 1612 07/23/14 0841   07/18/14 0200  ampicillin-sulbactam (UNASYN) 1.5 g in sodium chloride 0.9 % 50 mL IVPB  Status:  Discontinued     1.5 g 100 mL/hr over 30 Minutes Intravenous Every 8 hours 07/17/14 1621 07/19/14 0812   07/18/14 0000  azithromycin (ZITHROMAX) 500 mg in dextrose 5 % 250 mL IVPB  Status:  Discontinued     500 mg 250 mL/hr over 60 Minutes Intravenous Every 24 hours 07/17/14 1344 07/17/14 1612   07/17/14 1500  Ampicillin-Sulbactam (UNASYN) 3 g in sodium chloride 0.9 % 100 mL IVPB     3 g 100 mL/hr over 60 Minutes Intravenous  Once 07/17/14 1432 07/17/14 1900   07/17/14 1230  cefTRIAXone (ROCEPHIN) 1 g in dextrose 5 % 50 mL IVPB  Status:  Discontinued     1 g 100 mL/hr over 30 Minutes Intravenous  Once 07/17/14 1219 07/17/14 1416   07/17/14 1230  azithromycin (ZITHROMAX) 500 mg in dextrose 5 % 250 mL IVPB     500 mg 250 mL/hr over 60 Minutes Intravenous  Once 07/17/14 1219 07/17/14 1550       DVT Prophylaxis: Prophylactic Lovenox   Code Status: Full code   Family Communication None at bedside  Procedures: L ORIF by Dr. Linna Caprice on 07/20/2014 PEG attempted by IR failed on 07-22-14  CONSULTS:  orthopedic surgery  IR Gen Surgery  Time spent 25 minutes-Greater than 50% of this time was spent in counseling, explanation of diagnosis, planning of further management, and coordination of care.  MEDICATIONS: Scheduled Meds: . antiseptic oral rinse  7 mL Mouth Rinse q12n4p  . budesonide-formoterol  2 puff Inhalation BID  . chlorhexidine  15 mL Mouth Rinse BID  . [START ON 07/29/2014] enoxaparin (LOVENOX) injection  30 mg Subcutaneous Q24H  . feeding supplement (ENSURE ENLIVE)  237 mL Oral BID BM  . folic acid  1 mg Oral Daily  . levalbuterol  1.25 mg Nebulization TID  . multivitamin with minerals  1 tablet Oral Daily  . nebivolol  5 mg Oral Daily  . thiamine  100 mg Oral Daily  . tiotropium  18 mcg Inhalation Daily   Continuous Infusions: . dextrose 5 % and 0.45 % NaCl with KCl 20 mEq/L     PRN Meds:.acetaminophen **OR** [DISCONTINUED] acetaminophen, albuterol, diphenhydrAMINE, guaiFENesin, haloperidol  lactate, hydrALAZINE, HYDROcodone-acetaminophen, metoprolol, morphine injection, [DISCONTINUED] ondansetron **OR** ondansetron (ZOFRAN) IV    PHYSICAL EXAM: Vital signs in last 24 hours: Filed Vitals:   07/28/14 1125 07/28/14 1130 07/28/14 1135 07/28/14 1152  BP: 106/66 100/61 109/66 110/55  Pulse: 87   91  Temp: 97.3 F (36.3 C)   97.5 F (36.4 C)  TempSrc:      Resp: 15   20  Height:      Weight:      SpO2: 96%   93%    Weight change:  Filed Weights   07/17/14 1500 07/23/14 1312  Weight: 44.3 kg (97 lb 10.6 oz) 47.7 kg (105 lb 2.6 oz)   Body mass index is 16.47 kg/(m^2).   Gen Exam: Awake and alert with clear speech. Not in distress Neck: Supple, No JVD.  Chest: B/L Clear. Continues to have transmitted upper airway sounds CVS: S1 S2 Regular, no  murmurs.  Abdomen: soft, BS +, non tender, non distended. Extremities: no edema, lower extremities warm to touch. Neurologic: Non Focal.   Skin: No Rash.  Wounds: N/A.   Intake/Output from previous day:  Intake/Output Summary (Last 24 hours) at 07/28/14 1332 Last data filed at 07/28/14 1118  Gross per 24 hour  Intake   1850 ml  Output    280 ml  Net   1570 ml     LAB RESULTS: CBC  Recent Labs Lab 07/22/14 0345 07/23/14 0424 07/23/14 1550 07/24/14 0930 07/25/14 0444 07/26/14 0440  WBC 7.8 6.1  --  8.9  --  7.7  HGB 7.8* 7.3* 12.9* 10.2* 10.1* 10.5*  HCT 23.5* 21.8* 37.8* 28.9* 29.1* 31.0*  PLT 196 241  --  283  --  336  MCV 99.2 100.5*  --  92.6  --  94.5  MCH 32.9 33.6  --  32.7  --  32.0  MCHC 33.2 33.5  --  35.3  --  33.9  RDW 13.0 12.7  --  14.0  --  13.0    Chemistries   Recent Labs Lab 07/22/14 0345 07/24/14 0930 07/25/14 0444 07/27/14 0441  NA 138 135  --   --   K 3.5 3.6  --   --   CL 104 96*  --   --   CO2 27 30  --   --   GLUCOSE 89 99  --   --   BUN 12 8  --   --   CREATININE 0.87 0.66  --  0.64  CALCIUM 7.7* 8.2*  --   --   MG 1.6* 1.3* 1.5*  --     CBG:  Recent Labs Lab 07/23/14 0319  GLUCAP 75    GFR Estimated Creatinine Clearance: 59.6 mL/min (by C-G formula based on Cr of 0.64).  Coagulation profile No results for input(s): INR, PROTIME in the last 168 hours.  Cardiac Enzymes No results for input(s): CKMB, TROPONINI, MYOGLOBIN in the last 168 hours.  Invalid input(s): CK  Invalid input(s): POCBNP No results for input(s): DDIMER in the last 72 hours. No results for input(s): HGBA1C in the last 72 hours. No results for input(s): CHOL, HDL, LDLCALC, TRIG, CHOLHDL, LDLDIRECT in the last 72 hours. No results for input(s): TSH, T4TOTAL, T3FREE, THYROIDAB in the last 72 hours.  Invalid input(s): FREET3 No results for input(s): VITAMINB12, FOLATE, FERRITIN, TIBC, IRON, RETICCTPCT in the last 72 hours. No results for input(s):  LIPASE, AMYLASE in the last 72 hours.  Urine Studies No results for input(s):  UHGB, CRYS in the last 72 hours.  Invalid input(s): UACOL, UAPR, USPG, UPH, UTP, UGL, UKET, UBIL, UNIT, UROB, ULEU, UEPI, UWBC, URBC, UBAC, CAST, UCOM, BILUA  MICROBIOLOGY: Recent Results (from the past 240 hour(s))  MRSA PCR Screening     Status: None   Collection Time: 07/22/14  4:22 PM  Result Value Ref Range Status   MRSA by PCR NEGATIVE NEGATIVE Final    Comment:        The GeneXpert MRSA Assay (FDA approved for NASAL specimens only), is one component of a comprehensive MRSA colonization surveillance program. It is not intended to diagnose MRSA infection nor to guide or monitor treatment for MRSA infections.     RADIOLOGY STUDIES/RESULTS: Ct Abdomen Wo Contrast  07/21/2014   CLINICAL DATA:  68 year old male with generalized weakness. Evaluate anatomy prior to gastrostomy tube placement. History of dysphagia.  EXAM: CT ABDOMEN WITHOUT CONTRAST  TECHNIQUE: Multidetector CT imaging of the abdomen was performed following the standard protocol without IV contrast.  COMPARISON:  No priors.  FINDINGS: Lower chest: Areas of airspace consolidation in the right middle lobe and right lower lobe. Small right pleural effusion layering dependently. Areas of bronchiectasis and potential cavitation in the posterior aspect of the right lower lobe, poorly evaluated on today's noncontrast CT examination. 1.6 x 1.0 cm nodular density in the medial aspect of the left lower lobe (image 13 of series 5). Trace left pleural effusion. Atherosclerotic calcifications in the left anterior descending and right coronary arteries.  Hepatobiliary: No definite cystic or solid hepatic lesions are identified on today's noncontrast CT examination. Gallbladder is not confidently identified and could be collapsed or surgically absent (no surgical clips are noted in the region of the gallbladder fossa).  Pancreas: Pancreas is poorly depicted, but  grossly unremarkable.  Spleen: Unremarkable.  Adrenals/Urinary Tract: The unenhanced appearance of the kidneys and adrenal glands is normal bilaterally. No hydroureteronephrosis.  Stomach/Bowel: The unenhanced appearance of the visualized portions of the stomach is unremarkable. Importantly, however, there are loops of small bowel and colon superficial to the visualized portions of the stomach. No pathologic dilatation of the visualized portions of the small bowel or colon.  Vascular/Lymphatic: Atherosclerosis throughout the visualized abdominal vasculature, without definite aneurysm. No definite lymphadenopathy in the visualized portions of the abdomen on today's noncontrast CT examination.  Other: No significant volume of ascites or definite pneumoperitoneum identified in the abdomen.  Musculoskeletal: Chronic appearing compression fracture of L1 with approximately 20% loss of anterior vertebral body height. There are no aggressive appearing lytic or blastic lesions noted in the visualized portions of the skeleton.  IMPRESSION: 1. Stomach is incompletely visualized on today's examination. The visualized portions of the stomach are remarkable for superficial loops of small bowel and colon interposed between the stomach in the anterior abdominal wall. 2. Airspace consolidation in the right middle and lower lobes concerning for acute infection. This appears be associated with some areas of bronchiectasis and potential cavitation in the posterior aspect of the right lower lobe. There is also a small right parapneumonic pleural effusion. 3. Pleural-based 1.6 x 1.0 cm nodular density in the medial aspect of the left lower lobe. This could also be infectious or inflammatory in etiology, however, the possibility of neoplasm is not excluded, and close attention on future followup examinations is recommended. There is also trace left pleural effusion. At this time, noncontrast chest CT is recommended in 2-3 weeks to assess  for resolution of the right lower/middle lobe pneumonia following trial of  antimicrobial therapy, and to reassess this left lower lobe nodular region. 4. Extensive atherosclerosis, including at least 2 vessel coronary artery disease. Please note that although the presence of coronary artery calcium documents the presence of coronary artery disease, the severity of this disease and any potential stenosis cannot be assessed on this non-gated CT examination. Assessment for potential risk factor modification, dietary therapy or pharmacologic therapy may be warranted, if clinically indicated. 5. Additional incidental findings, as above.   Electronically Signed   By: Trudie Reed M.D.   On: 07/21/2014 20:26   Ct Head Wo Contrast  07/19/2014   CLINICAL DATA:  Headache, hallucinating objects  EXAM: CT HEAD WITHOUT CONTRAST  TECHNIQUE: Contiguous axial images were obtained from the base of the skull through the vertex without intravenous contrast.  COMPARISON:  None  FINDINGS: There is no evidence of mass effect, midline shift, or extra-axial fluid collections. There is no evidence of a space-occupying lesion or intracranial hemorrhage. There is no evidence of a cortical-based area of acute infarction. 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 There is generalized cerebral atrophy. There is periventricular white matter low attenuation likely secondary to microangiopathy.  The ventricles and sulci are appropriate for the patient's age. The basal cisterns are patent.  Visualized portions of the orbits are unremarkable. There is near complete opacification of the left maxillary sinus. There is an air-fluid level in the right sphenoid sinus. There is mild left ethmoid sinus mucosal thickening. Cerebrovascular atherosclerotic calcifications are noted.  The osseous structures are unremarkable.  IMPRESSION: 1. No acute intracranial pathology. 2. Chronic microvascular disease and cerebral atrophy. 3. Right sphenoid and  left maxillary sinus disease. Mild left ethmoid sinus disease.   Electronically Signed   By: Elige Ko   On: 07/19/2014 18:01   Pelvis Portable  07/20/2014   CLINICAL DATA:  69 year old status post left hip repair  EXAM: PORTABLE PELVIS 1-2 VIEWS  COMPARISON:  Preoperative radiographs 07/19/2014  FINDINGS: Interval ORIF of intertrochanteric left femoral fracture with intra medullary nail and a transfemoral neck gamma nail. Improved alignment of the fracture fragments. No evidence of acute hardware complication. Expected postoperative subcutaneous emphysema. Oral contrast material opacifies the colon. Scattered atherosclerotic vascular calcifications are noted.  IMPRESSION: ORIF left intertrochanteric femoral fracture without evidence of complication.   Electronically Signed   By: Malachy Moan M.D.   On: 07/20/2014 21:15   Ir Fluoro Rm 30-60 Min  07/23/2014   CLINICAL DATA:  Dysphagia, aspiration pneumonia. Request for enteral feeding support. Recent CT had demonstrated no safe percutaneous approach to the nondistended stomach due to overlying small bowel and colon.  EXAM: IR FLOURO RM 0-60 MIN  COMPARISON:  CT 07/21/2014  TECHNIQUE: Patient was positioned prone on the supine on the procedure table.  Intravenous Fentanyl and Versed were administered as conscious sedation during continuous cardiorespiratory monitoring by the radiology RN, with a total moderate sedation time of less than 30 minutes.  A 5 French angled angiographic catheter placed as orogastric tube to allow gastric insufflation with air. Under fluoroscopy, using multiple obliquities, it was apparent that the redundant transverse colon and loops of small bowel continued overlie the gastric body and antrum, such that no safe percutaneous approach was available for gastrostomy tube placement.  IMPRESSION: 1. No safe percutaneous approach for gastrostomy tube placement due to overlying bowel. Consider surgical placement if needed.    Electronically Signed   By: Corlis Leak M.D.   On: 07/23/2014 08:10   Dg Chest  Port 1 View  07/22/2014   CLINICAL DATA:  Right lower lobe infiltrate. Increasing shortness of breath and chest congestion.  EXAM: PORTABLE CHEST - 1 VIEW  COMPARISON:  07/17/2014  FINDINGS: Slight improvement in the pneumonia in the right lower lobe. New accentuation of the interstitial markings at the left lung base. Probable small bilateral pleural effusions superimposed on emphysema. Tortuosity of the thoracic aorta. Overall heart size is normal.  IMPRESSION: Slight improvement in right lower lobe pneumonia. Interstitial accentuation at the left lung base could represent pneumonitis. Is the patient aspirating? COPD. Probable small effusions.   Electronically Signed   By: Francene Boyers M.D.   On: 07/22/2014 07:40   Dg Chest Port 1 View  07/17/2014   CLINICAL DATA:  Pt found on kitchen floor. Believed to be there for approx 4 hrs. Sts he fell but sat himself down, wasn't a "crash." Complains of sob and congestion x3 days. Was too weak to stand up from fall. Has been taking mucinex. Per neighbor, he started coughing up blood clots yesterday.  EXAM: PORTABLE CHEST - 1 VIEW  COMPARISON:  04/27/2004  FINDINGS: There is dense consolidation in knee right lower lung, projecting in the right lower lobe. This is new from the prior study.  Lungs are hyperexpanded consistent with underlying COPD. No other lung consolidation. No pulmonary edema. No pleural effusion or pneumothorax.  Cardiac silhouette is normal in size. Aorta is uncoiled. No mediastinal or hilar masses or convincing adenopathy.  Bony thorax is demineralized but intact.  IMPRESSION: Right lower lobe consolidation consistent with pneumonia. Underlying COPD.   Electronically Signed   By: Amie Portland M.D.   On: 07/17/2014 11:58   Dg Knee Left Port  07/19/2014   CLINICAL DATA:  Status post fall.  EXAM: PORTABLE LEFT KNEE - 1-2 VIEW  COMPARISON:  None.  FINDINGS: There is no  evidence of fracture, dislocation, or joint effusion. There is generalized osteopenia. There is no evidence of arthropathy or other focal bone abnormality. Soft tissues are unremarkable. There is peripheral vascular atherosclerotic disease.  IMPRESSION: No acute osseous injury of the left knee.   Electronically Signed   By: Elige Ko   On: 07/19/2014 20:25   Dg Swallowing Func-speech Pathology  07/27/2014    Objective Swallowing Evaluation:    Patient Details  Name: MACARIO SHEAR MRN: [id number] Date of Birth: [birth year]  Today's Date: 07/27/2014 Time: SLP Start Time (ACUTE ONLY): 0950-SLP Stop Time (ACUTE ONLY): 1033 SLP Time Calculation (min) (ACUTE ONLY): 43 min  Past Medical History:  Past Medical History  Diagnosis Date  . Inguinal hernia   . COPD (chronic obstructive pulmonary disease)   . GERD (gastroesophageal reflux disease)   . Chronic hoarseness   . Tobacco abuse   . Cervical spinal stenosis     C4-C5; C5-C6; severe stenosis and abnormal  cord signal C6-C7 s/p  decompression in 04/2004   Past Surgical History:  Past Surgical History  Procedure Laterality Date  . Inguinal hernia repair    . Posterior laminectomy / decompression cervical spine  2006  . Intramedullary (im) nail intertrochanteric Left 07/20/2014    Procedure: INTRAMEDULLARY (IM) NAIL INTERTROCHANTRIC LEFT HIP;  Surgeon:  [name], MD;  Location: [facility];  Service: Orthopedics;  Laterality:  Left;   HPI:  Other Pertinent Information: Algenis Ballin is a 68 y.o. male, history of  COPD (not on oxygen), ongoing smoking, occasional alcohol use, history of  dysphagia on a soft diet per MBS in  September 2014, chronic hoarse voice,  GERD, generalized weakness and deconditioning.  Pt had fall with hip fx  requiring surgical repair.  PSH + for cervical decompression surgery in  2006, orthopedic sx during this admit.  Pt reported chronic problems with  hoarseness and dysphagia s/p cervical surgery 2006.  He also experienced  30 pound weight loss  within 3 years-pt reported in 2014.  He lives at home  alone and receives help from neighbors for getting groceries and  activities of daily living.  The MBS in September 2014 did show trace  aspiration of thins and penetration of nectar liquids.  CXR showed right  lower lobe pna.    Pt had planned on getting PEG tube but today this was  discontinued. hospital. Unfortunately Dysphagia had not improved, after  discussion with patient, PEG tube placement was planned, because of the  anatomy this was not able to be done by interventional radiology, general  surgery planned PEG tube placement on 6/13-however patient/family were  hesistant to proceed-and is now tentatively scheduled for 07/28/14 per MD  note,  Pt continues to express desire to consume po intake and receive PEG  for nutrition.   No Data Recorded  Assessment / Plan / Recommendation CHL IP CLINICAL IMPRESSIONS 07/27/2014  Therapy Diagnosis Severe pharyngeal phase dysphagia;Mild cervical  esophageal phase dysphagia;Moderate pharyngeal phase dysphagia;Moderate  cervical esophageal phase dysphagia  Clinical Impression Cough noted with expectoration of copious secretions  after dry swallow indicative of swallow ability.    Pt continues with moderately severe sensorimotor pharyngo-cervical  esophageal dysphagia without significant improvement since testing earlier  in this admission.  Weakness in pharyngeal contraction and tongue base  retraction/poor epiglottic deflection continues resulting in gross  pharyngeal residuals without adequate pt sensation.  With liquids pt  conducts multiple reflexive swallows (x4 - with liquids) with extended  breathhold *presumed to be compensatory to help clear and prevent gross  aspiration.  Poor hyolaryngeal elevation allows laryngeal penetration of  liquids during the swallow.  Mild amount of aspiration after the swallow  noted as barium spills posteior into open larynx from pyriform sinus.   Pt  did not consistently sense  residuals today (mixed with secretions) but  cued swallow, liquid follow solids and cued expectoration (of secretions  mixed with barium) effective to clear 80%.     Using live video, educated pt to findings, effective compensations.  Also  recommend continue with suction to help pt with his secretion management.      Pt again expresses desire for po with risks known and pursuing feeding  tube for nutritional support only.  Pt understands feeding tube will not  prevent aspiration but dysphagia prevents him from obtaining adequate  nutrition via po. Recommend consider dys3/thin with strict precautions and  KNOWN aspiration risks per pt wishes.  Pt will require crushed or liquid  medications due to level of dysphagia.        No flowsheet data found.   CHL IP DIET RECOMMENDATION 07/27/2014  SLP Diet Recommendations Dysphagia 3 (Mech soft);Thin  Liquid Administration via Straw,cup  Medication Administration Crushed with puree  Compensations Slow rate;Small sips/bites;Follow solids with  liquid;Multiple dry swallows after each bite/sip, cough and expectoration  t/o meal as needed  Postural Changes and/or Swallow Maneuvers n/a     CHL IP OTHER RECOMMENDATIONS 07/27/2014  Recommended Consults (None)  Oral Care Recommendations Oral care QID  Other Recommendations (None)     CHL IP FOLLOW UP RECOMMENDATIONS 07/26/2014  Follow up  Recommendations Skilled Nursing facility     CHL IP FREQUENCY AND DURATION 07/27/2014  Speech Therapy Frequency (ACUTE ONLY) min 2x/week  Treatment Duration 1 week         CHL IP REASON FOR REFERRAL 07/27/2014  Reason for Referral Objectively evaluate swallowing function     CHL IP ORAL PHASE 07/27/2014                    Oral Phase WFL                                                                  CHL IP PHARYNGEAL PHASE 07/27/2014  Pharyngeal Phase Impaired                                                                                                                    Pharyngeal Comment multiple  swallows (x3) across consistencies with  extended breath holding noted, mild laryngeal penetration noted during  swallow, trace aspiration noted after swallow due to spillage of barium  from pyriform sinus  into open larynx, following solids with liquids  helpful, cough and expectoration helpful to decrease residuals, head turn  right/left NOT helpful, limited postural changes available to use due to  cervical decompression sx in 06      CHL IP CERVICAL ESOPHAGEAL PHASE 07/27/2014  Cervical Esophageal Phase Impaired                                   Thin Straw Esophageal backflow into the pharynx;Other (Comment)     Cervical Esophageal Comment pt continues with poor clearance of cervical  esophagus with backflow of thin and puree without pt sensation, barium  mixed with secretions, per prior MBS in 2014 pt with narrowing of cervical  esophagus - suspect hardware impinging into cervical esophagus and  contributing to decreased clearance             Donavan Burnet, MS [facility] SLP [phone]    Dg Swallowing Func-speech Pathology  07/19/2014    Objective Swallowing Evaluation:    Patient Details  Name: MAYLON SAILORS MRN: [id number] Date of Birth: Oct 21, 1945  Today's Date: 07/19/2014 Time: SLP Start Time (ACUTE ONLY): 1120-SLP Stop Time (ACUTE ONLY): 1155 SLP Time Calculation (min) (ACUTE ONLY): 35 min  Past Medical History:  Past Medical History  Diagnosis Date  . Inguinal hernia   . COPD (chronic obstructive pulmonary disease)   . GERD (gastroesophageal reflux disease)   . Chronic hoarseness   . Tobacco abuse   . Cervical spinal stenosis     C4-C5; C5-C6; severe stenosis and abnormal  cord signal C6-C7 s/p  decompression in 04/2004   Past Surgical History:  Past Surgical  History  Procedure Laterality Date  . Inguinal hernia repair    . Posterior laminectomy / decompression cervical spine  2006   HPI:  Other Pertinent Information: Blease Capaldi is a 69 y.o. male, history of  COPD (not on oxygen), ongoing smoking, occasional  alcohol use, history of  dysphagia on a soft diet per MBS in September 2014, chronic hoarse voice,  GERD, generalized weakness and deconditioning.  PSH + for cervical  decompression surgery in 2006.  Pt reported chronic problems with  hoarseness and dysphagia s/p cervical surgery.  He also experienced 30  pound weight loss within 3 years-pt reported in 2014.  He lives at home  alone and receives help from neighbors for getting groceries and  activities of daily living.  The MBS in September 2014 did show trace  aspiration of thins and penetration of nectar liquids.  CXR showed right  lower lobe pna.    No Data Recorded  Assessment / Plan / Recommendation CHL IP CLINICAL IMPRESSIONS 07/19/2014  Therapy Diagnosis Severe pharyngeal phase dysphagia;Severe cervical  esophageal phase dysphagia;Moderate pharyngeal phase dysphagia;Moderate  cervical esophageal phase dysphagia  Clinical Impression Known chronic dysphagia with suspected acute  exacerbation.  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    Weakness in pharyngeal contraction and tongue base retraction/poor  epiglottic deflection results in gross pharyngeal residuals that mix with  secretions without adequate pt sensation.  With liquids, pt conducts  multiple reflexive swallows (x4 - with liquids) with extended breathhold  *presumed to be compensatory to help clear and prevent gross aspiration.   Poor hyolaryngeal elevation allows laryngeal penetration of liquids during  the swallow.  Liquid swallows helpful to reduce solid food residuals in  pharynx.    Mild amount of aspiration (silent) after the swallow noted as barium  spills posterior into open larynx from pyriform sinus.   Pt did not sense  residuals today (mixed with secretions) but cued swallow, liquid follow  solids and cued expectoration (of secretions mixed with barium) effective  to clear 80%.    Per previous MBS in 2014 *Dr Bradly Chris,  radiologist stated findings  consistent with esophagram from 08/2012 indicating narrowing of lower  cervical esophagus and dilated cervical esophagus.  SLP today noted area  near cervical hardware where barium pools slightly, clears with reflexive  swallows.   Mr Grattan again reports desire to eat with known aspiration/malnutrition  risks.  Pt does admit to further weight loss of 5 pounds since 2014 and  states he has "good days and bad days" re: swallowing.  SLP  advised him  to monitor swallowing/nutrition closely to maximize  hydration/nutrition/airway safety.  Pt readily states liquid are easier  for him to consume.    To respect pt wishes, recommend dys3/thin with precautions.  Pt will  require crushed or liquid medications due to level of dysphagia.  Using  live video, educated pt to findings, effective compensations.  Also  recommend pt have suction set up to help with his secretion management.           No flowsheet data found.   CHL IP DIET RECOMMENDATION 07/19/2014  SLP Diet Recommendations Dysphagia 3 (Mech soft);Thin  Liquid Administration via cup  Medication Administration Crushed with puree  Compensations Slow rate;Small sips/bites;Follow solids with  liquid;Multiple dry swallows after each bite/sip, cough and expectorate  Postural Changes and/or Swallow Maneuvers (None)     CHL IP OTHER RECOMMENDATIONS 07/19/2014  Recommended Consults (None)  Oral Care Recommendations Oral care QID  Other Recommendations (None)     CHL IP FOLLOW UP RECOMMENDATIONS 10/28/2012  Follow up Recommendations None     CHL IP FREQUENCY AND DURATION 07/19/2014  Speech Therapy Frequency (ACUTE ONLY) min 2x/week  Treatment Duration 1 week         CHL IP REASON FOR REFERRAL 07/19/2014  Reason for Referral Objectively evaluate swallowing function     CHL IP ORAL PHASE 07/19/2014                    Oral Phase WFL                     CHL IP PHARYNGEAL PHASE 07/19/2014  Pharyngeal Phase Impaired  Pharyngeal Comment multiple swallows across  consistencies, following solid  with liquids helpful to decrease vallecular residuals, cough and  expectoration helpful, limited postural changes available due to cervical  decompression sx in 06      Donavan Burnet, MS [facility] SLP [phone]    Dg C-arm 1-60 Min  07/20/2014   CLINICAL DATA:  69 year old male with left intertrochanteric femoral fracture undergoing ORIF  EXAM: DG C-ARM 61-120 MIN; LEFT FEMUR 2 VIEWS  COMPARISON:  Preoperative radiographs 07/19/2014  FINDINGS: For intraoperative spot radiographs demonstrate open reduction internal fixation of intertrochanteric femoral fracture with an intra medullary femoral nail and transfemoral neck gamma nail. No evidence of immediate hardware complication. There is a single distal interlocking screw.  IMPRESSION: ORIF of left intertrochanteric femoral fracture without evidence of immediate complication   Electronically Signed   By: Malachy Moan M.D.   On: 07/20/2014 20:39   Dg Hip Unilat With Pelvis 2-3 Views Left  07/19/2014   CLINICAL DATA:  Fall today with left hip pain, initial encounter  EXAM: LEFT HIP (WITH PELVIS) 2-3 VIEWS  COMPARISON:  None.  FINDINGS: There is an a minimally displaced intratrochanteric fracture of the proximal left femur. No dislocation is noted. Contrast material is seen from prior speech pathology examination. The pelvic ring is otherwise intact. No other focal abnormality is seen.  IMPRESSION: Mildly displaced left intratrochanteric femoral fracture.   Electronically Signed   By: Alcide Clever M.D.   On: 07/19/2014 17:34   Dg Femur Min 2 Views Left  07/20/2014   CLINICAL DATA:  Postop left hip fracture repair  EXAM: LEFT FEMUR 2 VIEWS  COMPARISON:  07/19/2014  FINDINGS: Internal fixation across the left intertrochanteric femoral fracture. Near anatomic alignment. No hardware or bony complicating feature.  IMPRESSION: Internal fixation across the left femoral intertrochanteric fracture. No complicating feature.   Electronically  Signed   By: Charlett Nose M.D.   On: 07/20/2014 21:20   Dg Femur Min 2 Views Left  07/20/2014   CLINICAL DATA:  69 year old male with left intertrochanteric femoral fracture undergoing ORIF  EXAM: DG C-ARM 61-120 MIN; LEFT FEMUR 2 VIEWS  COMPARISON:  Preoperative radiographs 07/19/2014  FINDINGS: For intraoperative spot radiographs demonstrate open reduction internal fixation of intertrochanteric femoral fracture with an intra medullary femoral nail and transfemoral neck gamma nail. No evidence of immediate hardware complication. There is a single distal interlocking screw.  IMPRESSION: ORIF of left intertrochanteric femoral fracture without evidence of immediate complication   Electronically Signed   By: Malachy Moan M.D.   On: 07/20/2014 20:39    Jeoffrey Massed, MD  [facility] Pager:[phone]  If 7PM-7AM, please contact night-coverage [email or web address] Password [email or web address] 07/28/2014, 1:32 PM   LOS: 11 days

## 2014-07-28 NOTE — Anesthesia Preprocedure Evaluation (Signed)
Anesthesia Evaluation  Patient identified by MRN, date of birth, ID band Patient awake    Reviewed: Allergy & Precautions, NPO status , Patient's Chart, lab work & pertinent test results  Airway Mallampati: II   Neck ROM: limited    Dental   Pulmonary COPDCurrent Smoker,  + rhonchi         Cardiovascular negative cardio ROS  Rhythm:regular Rate:Normal     Neuro/Psych    GI/Hepatic GERD-  ,  Endo/Other    Renal/GU      Musculoskeletal  (+) Arthritis -,   Abdominal   Peds  Hematology   Anesthesia Other Findings   Reproductive/Obstetrics                             Anesthesia Physical Anesthesia Plan  ASA: III  Anesthesia Plan: General   Post-op Pain Management:    Induction: Intravenous  Airway Management Planned: Oral ETT  Additional Equipment:   Intra-op Plan:   Post-operative Plan: Extubation in OR and Possible Post-op intubation/ventilation  Informed Consent: I have reviewed the patients History and Physical, chart, labs and discussed the procedure including the risks, benefits and alternatives for the proposed anesthesia with the patient or authorized representative who has indicated his/her understanding and acceptance.     Plan Discussed with: CRNA, Anesthesiologist and Surgeon  Anesthesia Plan Comments:         Anesthesia Quick Evaluation

## 2014-07-28 NOTE — Progress Notes (Signed)
CSW continuing to follow.   CSW reviewed chart and noted that pt had PEG tube placement this morning.   CSW followed up with pt daughter, Renda Rolls via telephone. Per pt daughter, she spoke with Dr. Ezzard Standing who reported that PEG tube feeds would likely not be initiated until tomorrow and anticipate d/c tomorrow afternoon or more likely Friday. CSW discussed with pt daughter that CSW spoke with [facility] and anticipate that there will be semi-private room available tomorrow and CSW will check with facility tomorrow regarding availability for Friday. CSW discussed with pt daughter that CSW e-mailed pt daughter alternative SNF bed offers in case [facility] not have availability upon discharge. Pt daughter plans to review bed offers in order to have a secondary option in mind.   CSW to follow up with pt daughter tomorrow regarding pt disposition plan. Pt daughter remains hopeful that [facility] will have bed available.   CSW to continue to follow to provide support and assist with pt disposition needs.  Loletta Specter, MSW, LCSW Clinical Social Work [phone]

## 2014-07-28 NOTE — Progress Notes (Signed)
SLP Cancellation Note  Patient Details Name: Phillip May MRN: [id number] DOB: 23-Apr-1945   Cancelled treatment:       Reason Eval/Treat Not Completed: Other (comment);Medical issues which prohibited therapy (pt for PEG today - under general anesthesia)   Donavan Burnet, MS [facility] SLP [phone]

## 2014-07-28 NOTE — Anesthesia Postprocedure Evaluation (Signed)
Anesthesia Post Note  Patient: Phillip May  Procedure(s) Performed: Procedure(s) (LRB): LAPAROSCOPY DIAGNOSTIC (N/A) PERCUTANEOUS ENDOSCOPIC GASTROSTOMY (PEG) PLACEMENT  Anesthesia type: General  Patient location: PACU  Post pain: Pain level controlled and Adequate analgesia  Post assessment: Post-op Vital signs reviewed, Patient's Cardiovascular Status Stable, Respiratory Function Stable, Patent Airway and Pain level controlled  Last Vitals:  Filed Vitals:   07/28/14 1125  BP: 106/66  Pulse: 87  Temp: 36.3 C  Resp: 15    Post vital signs: Reviewed and stable  Level of consciousness: awake, alert  and oriented  Complications: No apparent anesthesia complications

## 2014-07-28 NOTE — Anesthesia Procedure Notes (Signed)
Procedure Name: Intubation Date/Time: 07/28/2014 9:21 AM Performed by: Enriqueta Shutter D Pre-anesthesia Checklist: Patient identified, Emergency Drugs available, Suction available and Patient being monitored Patient Re-evaluated:Patient Re-evaluated prior to inductionOxygen Delivery Method: Circle System Utilized Preoxygenation: Pre-oxygenation with 100% oxygen Intubation Type: IV induction Ventilation: Mask ventilation without difficulty Laryngoscope Size: Mac and 4 Grade View: Grade II Tube type: Oral Tube size: 7.5 mm Number of attempts: 1 Airway Equipment and Method: Stylet and Oral airway Placement Confirmation: ETT inserted through vocal cords under direct vision,  positive ETCO2 and breath sounds checked- equal and bilateral Secured at: 22 cm Tube secured with: Tape Dental Injury: Teeth and Oropharynx as per pre-operative assessment

## 2014-07-29 DIAGNOSIS — I10 Essential (primary) hypertension: Secondary | ICD-10-CM

## 2014-07-29 LAB — GLUCOSE, CAPILLARY
Glucose-Capillary: 100 mg/dL — ABNORMAL HIGH (ref 65–99)
Glucose-Capillary: 114 mg/dL — ABNORMAL HIGH (ref 65–99)
Glucose-Capillary: 94 mg/dL (ref 65–99)
Glucose-Capillary: 97 mg/dL (ref 65–99)

## 2014-07-29 MED ORDER — JEVITY 1.2 CAL PO LIQD: 1000.0000 mL | ORAL | Status: DC

## 2014-07-29 MED ORDER — VITAMINS A & D EX OINT
TOPICAL_OINTMENT | CUTANEOUS | Status: AC
  Administered 2014-07-29: 5
  Filled 2014-07-29: qty 5

## 2014-07-29 MED ORDER — OSMOLITE 1.2 CAL PO LIQD
1000.0000 mL | ORAL | Status: DC
  Administered 2014-07-29: 1000 mL
  Filled 2014-07-29: qty 1000

## 2014-07-29 NOTE — Op Note (Signed)
NAMEMUREL, GUIJOSA NO.:  [id number]  MEDICAL RECORD NO.:  [id number]  LOCATION:  1434                         FACILITY:  [facility]  PHYSICIAN:  Sandria Bales. Ezzard Standing, M.D.  DATE OF BIRTH:  [birth year]  DATE OF PROCEDURE:  07/28/2014                               OPERATIVE REPORT   PREOPERATIVE DIAGNOSIS:  Failure to thrive with poor swallowing mechanism.  POSTOPERATIVE DIAGNOSIS:  Failure to thrive with poor swallowing mechanism.  PROCEDURES:  Diagnostic laparoscopy, percutaneous endoscopic gastrostomy placement.  SURGEON:  Sandria Bales. Ezzard Standing, M.D.  FIRST ASSISTANT:  None.  ANESTHESIA:  General endotracheal supervised by Dr. Achille Rich.  ESTIMATED BLOOD LOSS:  Minimal.  ANESTHESIA:  The local anesthetic used was 10 mL of 1% Xylocaine.  INDICATION FOR PROCEDURE:  Mr. Headings is a 69 year old African American male, whose primary care doctor is Dr. Merri Brunette.  He was admitted to [facility] on the July 17, 2014, for acute-on-chronic respiratory failure.  It is felt that possibly part of his failure is due to chronic aspiration from poor esophageal motility.  While here, he sustained a left hip fracture on July 19, 2014, and was repaired by Dr. Linna Caprice on July 20, 2014.    I had a lengthy discussion between the patient, his two children, [place] and [place], about managing his dysphagia and poor p.o. intake.  There was interest in pursuing placing a gastrostomy tube.  He was sent to Radiology, but however, there was concerned about the colon being interposed between the abdominal wall and stomach and therefore, they asked for General Surgery consultation.  I had a lengthy discussion with the patient himself and both his children by phone about the benefits and risks of surgery.  Primary risk would be leakage from the bowel and death from the surgery.  They have decided to go ahead with gastrostomy tube placement.  OPERATIVE PROCEDURE:  The patient was taken to room #6  at [facility], was given a gram of Ancef initial to procedure.  He underwent a general endotracheal anesthesia.  A time-out was held and surgical checklist run.  Because of the concern about the colon being in the abdominal wall, I thought the best thing would be to laparoscope him to see his internal anatomy, so I placed a 5-mm Hasson trocar at the infraumbilical incision and secured this with a 0 Vicryl suture.  His right and left lobes of liver was unremarkable.  Stomach was unremarkable.  He has essentially no body fat at all.  His transverse colon did lay high in his upper abdomen under the left lobe of liver and overlay his stomach.  With the laparoscope and in reverse Trendelenburg position, I was able to sweep the colon inferiorly to expose the anterior wall of the stomach.  I then broke scrub and passed a Pentax adult endoscope down his esophagus into his stomach and insufflated the stomach.  The esophagus and stomach were unremarkable and I could not find a reason for his poor swallowing.  While directly looking with the laparoscope at the anterior stomach wall, I was able to determine that there was no colon between the stomach and the anterior  abdominal wall.  I then placed a needle through the anterior abdominal wall, threaded a guidewire through this needle.  The guidewire was captured with the endoscope and brought out through the mouth.  We then overfed a percutaneous endoscopic gastrostomy tube.  This is a 24-French tube over the guidewire, this was threaded down into the stomach.  I then reinserted the endoscope and make sure that there was no bleeding around the bumper of the PEG and that it looked good.  I could laparoscope in and I saw no evidence of bowel between the anterior abdominal wall and the PEG placement.  The laparoscopic trocar was then removed.  The umbilical port was closed with a 0 Vicryl suture, the skin was closed with a 5-0 Vicryl suture.  I placed the  bumper over the PEG tube, cut the tube, placed the attachment devices to the PEG tube and put the bumper, I sewed that in place with two 2-0 nylon sutures with gauze around the gastrostomy tube.  The patient tolerated the procedure well.  He was somewhat hypotensive at the beginning of the procedure.  This was probably secondary to dehydration, behind the fluids, but otherwise, he did well.  He was transferred to the recovery room in good condition.  We will keep him n.p.o. for 24 hours with plan start feedings on him at a slow rate tomorrow that he will be able to tolerate.   Sandria Bales. Ezzard Standing, M.D., [facility] , scribe for Epic   DHN/MEDQ  D:  07/28/2014  T:  07/29/2014  Job:  [id number]  cc:   Soyla Murphy. Renne Crigler, M.D. Fax: [phone]  Jeoffrey Massed, MD

## 2014-07-29 NOTE — Progress Notes (Signed)
CSW continuing to follow for disposition planning.  Per MD, anticipate d/c tomorrow to SNF.   CSW contacted [facility] and confirmed that facility has shared room availability for tomorrow.   CSW contacted pt daughter, Renda Rolls via telephone. CSW updated pt daughter that [facility] would [facility] available, but pt would be admitted to shared room and not private room as pt and pt family were initially hopeful for. Pt daughter states that she would still like pt to admit to [facility] for pt rehab needs.   CSW notified [facility] and confirmed that facility can accept pt tomorrow.   CSW to continue to follow to provide support and assist with pt discharge planning needs to [facility] when pt medically ready for discharge.  Loletta Specter, MSW, LCSW Clinical Social Work [phone]

## 2014-07-29 NOTE — Progress Notes (Signed)
PATIENT DETAILS Name: Phillip May Age: 69 y.o. Sex: male Date of Birth: Oct 04, 1945 Admit Date: 07/17/2014 Admitting Physician Leroy Sea, MD WUJ:WJXBJ,YNWGNF DAVIDSON, MD  Brief narrative  69 year old male with history of COPD, EtOH use, tobacco abuse, chronic dysphagia, GERD who lives at home by himself with the help of neighbors was found on the floor on the day of admission and brought to the emergency room. Found to have acute on chronic respiratory failure likely secondary to presumed aspiration pneumonia, patient was subsequently admitted to the hospital. Hospital course has been complicated by development of delirium which resulted in a fall causing a left hip fracture. During this hospital stay, patient was seen by speech therapy and found to have significant dysphagia and was kept nothing by mouth. Unfortunately Dysphagia has not improved, after discussion with patient, PEG tube placement was planned, because of the anatomy this was not able to be done by interventional radiology, general surgery planned PEG tube placement on 6/13-however patient/family were hesistant to and was rescheduled for 07/28/14  Subjective: Wants to eat  Assessment/Plan: Principal Problem: Presumed aspiration pneumonia: Has completed a course of Unasyn- discontinued on 6/13. Remains afebrile and nontoxic looking.  Active Problems: Acute on chronic respiratory failure: Secondary to above. Continue nothing by mouth-await placement of PEG tube. Both family (spoke to daughter and son 6/13) and patient aware that patient will continue to have aspiration risk with or without peg tube.  Left hip fracture: Occurred on 6/7 after sustaining a mechanical fall while delirious in the hospital. Orthopedics consulted, underwent ORIF on 07/20/2014. On Lovenox for DVT prophylaxis. Hospital course was complicated by development of perioperative blood loss anemia requiring 1 unit of PRBC on 6/10.  Hemoglobin currently stable.  Chronic dysphagia: See above-underwent SLP evaluation recommendations were NPO.Peg tube placement atempted by IR 07/22/2014-however unsuccessful due to overlying small bowel, consulted general surgery on 07/23/2014, was due for PEG tube placement 6/13-however patient and family were hesitant. I spoke with patient, son and daughter 6/13 after much discussion-family has  decided to go ahead with peg tube placement. Subsequently underwent diagnostic laparoscopy and percutaneous PEG tube placement in the OR by Gen Surgery on 07/28/14.EGD during percutaneous peg placement did not show major abnormalities in the esophagus and stomach. Had a repeat SLP evaluation on 6/14, patient and family would like to continue with some minimal oral intake for comfort. Family and patient are aware of risks and accepting all risks-there with that this could lead to life-threatening/disabling pneumonia,sepsis choking, respiratory failure and death.  Hypertension: Controlled  Amlodipine and Bystolic  Sepsis: Secondary to pneumonia. Has resolved with IV fluids and antibiotics.  History of COPD: stable with clear lungs. No current bronchodilators regimen.  Tobacco abuse: Counseled extensively.  Alcohol abuse with withdrawal: No signs of withdrawal currently. He remains on Ativan per protocol.  Anemia: Multifactorial-suspect has anemia of chronic disease at baseline, worsened by perioperative blood loss following left hip repair. Hemoglobin currently stable. Monitor periodically.  Severe Protein-calorie malnutrition: Continue supplements  Disposition: Remain inpatient-SNF ?6/16 if ok with [facility]  Antimicrobial agents  See below  Anti-infectives    Start     Dose/Rate Route Frequency Ordered Stop   07/28/14 0809  [MAR Hold]  ceFAZolin (ANCEF) IVPB 2 g/50 mL premix     (MAR Hold since 07/28/14 0810)   2 g 100 mL/hr over 30 Minutes Intravenous 30 min pre-op 07/28/14 0735 07/28/14 6213  07/26/14 0000  metroNIDAZOLE (FLAGYL) IVPB 500 mg  Status:  Discontinued    Comments:  Pharmacy may adjust dosing strength, interval, or rate of medication as needed for optimal therapy for the patient Send with patient on call to the OR.  Anesthesia to complete antibiotic administration <5min prior to incision per [facility].   500 mg 100 mL/hr over 60 Minutes Intravenous 30 min pre-op 07/25/14 1043 07/28/14 0735   07/21/14 0000  ceFAZolin (ANCEF) IVPB 2 g/50 mL premix     2 g 100 mL/hr over 30 Minutes Intravenous Every 6 hours 07/20/14 2139 07/21/14 0646   07/20/14 1000  ceFAZolin (ANCEF) IVPB 2 g/50 mL premix     2 g 100 mL/hr over 30 Minutes Intravenous  Once 07/19/14 2133 07/20/14 1833   07/19/14 1000  ampicillin-sulbactam (UNASYN) 1.5 g in sodium chloride 0.9 % 50 mL IVPB  Status:  Discontinued     1.5 g 100 mL/hr over 30 Minutes Intravenous Every 6 hours 07/19/14 0812 07/26/14 1027   07/18/14 1400  azithromycin (ZITHROMAX) 500 mg in dextrose 5 % 250 mL IVPB  Status:  Discontinued     500 mg 250 mL/hr over 60 Minutes Intravenous Every 24 hours 07/17/14 1612 07/23/14 0841   07/18/14 0200  ampicillin-sulbactam (UNASYN) 1.5 g in sodium chloride 0.9 % 50 mL IVPB  Status:  Discontinued     1.5 g 100 mL/hr over 30 Minutes Intravenous Every 8 hours 07/17/14 1621 07/19/14 0812   07/18/14 0000  azithromycin (ZITHROMAX) 500 mg in dextrose 5 % 250 mL IVPB  Status:  Discontinued     500 mg 250 mL/hr over 60 Minutes Intravenous Every 24 hours 07/17/14 1344 07/17/14 1612   07/17/14 1500  Ampicillin-Sulbactam (UNASYN) 3 g in sodium chloride 0.9 % 100 mL IVPB     3 g 100 mL/hr over 60 Minutes Intravenous  Once 07/17/14 1432 07/17/14 1900   07/17/14 1230  cefTRIAXone (ROCEPHIN) 1 g in dextrose 5 % 50 mL IVPB  Status:  Discontinued     1 g 100 mL/hr over 30 Minutes Intravenous  Once 07/17/14 1219 07/17/14 1416   07/17/14 1230  azithromycin (ZITHROMAX) 500 mg in dextrose 5 % 250 mL IVPB     500  mg 250 mL/hr over 60 Minutes Intravenous  Once 07/17/14 1219 07/17/14 1550      DVT Prophylaxis: Prophylactic Lovenox   Code Status: Full code   Family Communication None at bedside  Procedures: L ORIF by Dr. Linna Caprice on 07/20/2014 PEG attempted by IR failed on 07-22-14 Diagnostic laparoscopy, percutaneous endoscopic gastrostomy placement by Dr Ezzard Standing on 6/15  CONSULTS:  orthopedic surgery  IR Gen Surgery  Time spent 25 minutes-Greater than 50% of this time was spent in counseling, explanation of diagnosis, planning of further management, and coordination of care.  MEDICATIONS: Scheduled Meds: . antiseptic oral rinse  7 mL Mouth Rinse q12n4p  . budesonide-formoterol  2 puff Inhalation BID  . chlorhexidine  15 mL Mouth Rinse BID  . enoxaparin (LOVENOX) injection  30 mg Subcutaneous Q24H  . feeding supplement (ENSURE ENLIVE)  237 mL Oral BID BM  . folic acid  1 mg Oral Daily  . levalbuterol  1.25 mg Nebulization TID  . multivitamin with minerals  1 tablet Oral Daily  . nebivolol  5 mg Oral Daily  . thiamine  100 mg Oral Daily  . tiotropium  18 mcg Inhalation Daily   Continuous Infusions: . dextrose 5 % and 0.45 % NaCl with  KCl 20 mEq/L 100 mL/hr at 07/29/14 1057  . feeding supplement (OSMOLITE 1.2 CAL)     PRN Meds:.acetaminophen **OR** [DISCONTINUED] acetaminophen, albuterol, diphenhydrAMINE, guaiFENesin, haloperidol lactate, hydrALAZINE, HYDROcodone-acetaminophen, metoprolol, morphine injection, [DISCONTINUED] ondansetron **OR** ondansetron (ZOFRAN) IV    PHYSICAL EXAM: Vital signs in last 24 hours: Filed Vitals:   07/28/14 2138 07/29/14 0455 07/29/14 0822 07/29/14 1147  BP:  141/72    Pulse:  76    Temp:  98.8 F (37.1 C)    TempSrc:  Oral    Resp:  18    Height:      Weight:      SpO2: 99% 100% 99% 98%    Weight change:  Filed Weights   07/17/14 1500 07/23/14 1312  Weight: 44.3 kg (97 lb 10.6 oz) 47.7 kg (105 lb 2.6 oz)   Body mass index is 16.47  kg/(m^2).   Gen Exam: Awake and alert with clear speech. Not in distress Neck: Supple, No JVD.  Chest: B/L Clear. Continues to have transmitted upper airway sounds CVS: S1 S2 Regular, no murmurs.  Abdomen: soft, BS +, non tender, non distended. Extremities: no edema, lower extremities warm to touch. Neurologic: Non Focal.   Skin: No Rash.  Wounds: N/A.   Intake/Output from previous day:  Intake/Output Summary (Last 24 hours) at 07/29/14 1218 Last data filed at 07/29/14 0700  Gross per 24 hour  Intake 1691.67 ml  Output    150 ml  Net 1541.67 ml     LAB RESULTS: CBC  Recent Labs Lab 07/23/14 0424 07/23/14 1550 07/24/14 0930 07/25/14 0444 07/26/14 0440  WBC 6.1  --  8.9  --  7.7  HGB 7.3* 12.9* 10.2* 10.1* 10.5*  HCT 21.8* 37.8* 28.9* 29.1* 31.0*  PLT 241  --  283  --  336  MCV 100.5*  --  92.6  --  94.5  MCH 33.6  --  32.7  --  32.0  MCHC 33.5  --  35.3  --  33.9  RDW 12.7  --  14.0  --  13.0    Chemistries   Recent Labs Lab 07/24/14 0930 07/25/14 0444 07/27/14 0441  NA 135  --   --   K 3.6  --   --   CL 96*  --   --   CO2 30  --   --   GLUCOSE 99  --   --   BUN 8  --   --   CREATININE 0.66  --  0.64  CALCIUM 8.2*  --   --   MG 1.3* 1.5*  --     CBG:  Recent Labs Lab 07/23/14 0319  GLUCAP 75    GFR Estimated Creatinine Clearance: 59.6 mL/min (by C-G formula based on Cr of 0.64).  Coagulation profile No results for input(s): INR, PROTIME in the last 168 hours.  Cardiac Enzymes No results for input(s): CKMB, TROPONINI, MYOGLOBIN in the last 168 hours.  Invalid input(s): CK  Invalid input(s): POCBNP No results for input(s): DDIMER in the last 72 hours. No results for input(s): HGBA1C in the last 72 hours. No results for input(s): CHOL, HDL, LDLCALC, TRIG, CHOLHDL, LDLDIRECT in the last 72 hours. No results for input(s): TSH, T4TOTAL, T3FREE, THYROIDAB in the last 72 hours.  Invalid input(s): FREET3 No results for input(s): VITAMINB12,  FOLATE, FERRITIN, TIBC, IRON, RETICCTPCT in the last 72 hours. No results for input(s): LIPASE, AMYLASE in the last 72 hours.  Urine Studies No results for input(s):  UHGB, CRYS in the last 72 hours.  Invalid input(s): UACOL, UAPR, USPG, UPH, UTP, UGL, UKET, UBIL, UNIT, UROB, ULEU, UEPI, UWBC, URBC, UBAC, CAST, UCOM, BILUA  MICROBIOLOGY: Recent Results (from the past 240 hour(s))  MRSA PCR Screening     Status: None   Collection Time: 07/22/14  4:22 PM  Result Value Ref Range Status   MRSA by PCR NEGATIVE NEGATIVE Final    Comment:        The GeneXpert MRSA Assay (FDA approved for NASAL specimens only), is one component of a comprehensive MRSA colonization surveillance program. It is not intended to diagnose MRSA infection nor to guide or monitor treatment for MRSA infections.     RADIOLOGY STUDIES/RESULTS: Ct Abdomen Wo Contrast  07/21/2014   CLINICAL DATA:  68 year old male with generalized weakness. Evaluate anatomy prior to gastrostomy tube placement. History of dysphagia.  EXAM: CT ABDOMEN WITHOUT CONTRAST  TECHNIQUE: Multidetector CT imaging of the abdomen was performed following the standard protocol without IV contrast.  COMPARISON:  No priors.  FINDINGS: Lower chest: Areas of airspace consolidation in the right middle lobe and right lower lobe. Small right pleural effusion layering dependently. Areas of bronchiectasis and potential cavitation in the posterior aspect of the right lower lobe, poorly evaluated on today's noncontrast CT examination. 1.6 x 1.0 cm nodular density in the medial aspect of the left lower lobe (image 13 of series 5). Trace left pleural effusion. Atherosclerotic calcifications in the left anterior descending and right coronary arteries.  Hepatobiliary: No definite cystic or solid hepatic lesions are identified on today's noncontrast CT examination. Gallbladder is not confidently identified and could be collapsed or surgically absent (no surgical clips are  noted in the region of the gallbladder fossa).  Pancreas: Pancreas is poorly depicted, but grossly unremarkable.  Spleen: Unremarkable.  Adrenals/Urinary Tract: The unenhanced appearance of the kidneys and adrenal glands is normal bilaterally. No hydroureteronephrosis.  Stomach/Bowel: The unenhanced appearance of the visualized portions of the stomach is unremarkable. Importantly, however, there are loops of small bowel and colon superficial to the visualized portions of the stomach. No pathologic dilatation of the visualized portions of the small bowel or colon.  Vascular/Lymphatic: Atherosclerosis throughout the visualized abdominal vasculature, without definite aneurysm. No definite lymphadenopathy in the visualized portions of the abdomen on today's noncontrast CT examination.  Other: No significant volume of ascites or definite pneumoperitoneum identified in the abdomen.  Musculoskeletal: Chronic appearing compression fracture of L1 with approximately 20% loss of anterior vertebral body height. There are no aggressive appearing lytic or blastic lesions noted in the visualized portions of the skeleton.  IMPRESSION: 1. Stomach is incompletely visualized on today's examination. The visualized portions of the stomach are remarkable for superficial loops of small bowel and colon interposed between the stomach in the anterior abdominal wall. 2. Airspace consolidation in the right middle and lower lobes concerning for acute infection. This appears be associated with some areas of bronchiectasis and potential cavitation in the posterior aspect of the right lower lobe. There is also a small right parapneumonic pleural effusion. 3. Pleural-based 1.6 x 1.0 cm nodular density in the medial aspect of the left lower lobe. This could also be infectious or inflammatory in etiology, however, the possibility of neoplasm is not excluded, and close attention on future followup examinations is recommended. There is also trace left  pleural effusion. At this time, noncontrast chest CT is recommended in 2-3 weeks to assess for resolution of the right lower/middle lobe pneumonia following trial of  antimicrobial therapy, and to reassess this left lower lobe nodular region. 4. Extensive atherosclerosis, including at least 2 vessel coronary artery disease. Please note that although the presence of coronary artery calcium documents the presence of coronary artery disease, the severity of this disease and any potential stenosis cannot be assessed on this non-gated CT examination. Assessment for potential risk factor modification, dietary therapy or pharmacologic therapy may be warranted, if clinically indicated. 5. Additional incidental findings, as above.   Electronically Signed   By: Trudie Reed M.D.   On: 07/21/2014 20:26   Ct Head Wo Contrast  07/19/2014   CLINICAL DATA:  Headache, hallucinating objects  EXAM: CT HEAD WITHOUT CONTRAST  TECHNIQUE: Contiguous axial images were obtained from the base of the skull through the vertex without intravenous contrast.  COMPARISON:  None  FINDINGS: There is no evidence of mass effect, midline shift, or extra-axial fluid collections. There is no evidence of a space-occupying lesion or intracranial hemorrhage. There is no evidence of a cortical-based area of acute infarction. 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 There is generalized cerebral atrophy. There is periventricular white matter low attenuation likely secondary to microangiopathy.  The ventricles and sulci are appropriate for the patient's age. The basal cisterns are patent.  Visualized portions of the orbits are unremarkable. There is near complete opacification of the left maxillary sinus. There is an air-fluid level in the right sphenoid sinus. There is mild left ethmoid sinus mucosal thickening. Cerebrovascular atherosclerotic calcifications are noted.  The osseous structures are unremarkable.  IMPRESSION: 1. No acute  intracranial pathology. 2. Chronic microvascular disease and cerebral atrophy. 3. Right sphenoid and left maxillary sinus disease. Mild left ethmoid sinus disease.   Electronically Signed   By: Elige Ko   On: 07/19/2014 18:01   Pelvis Portable  07/20/2014   CLINICAL DATA:  69 year old status post left hip repair  EXAM: PORTABLE PELVIS 1-2 VIEWS  COMPARISON:  Preoperative radiographs 07/19/2014  FINDINGS: Interval ORIF of intertrochanteric left femoral fracture with intra medullary nail and a transfemoral neck gamma nail. Improved alignment of the fracture fragments. No evidence of acute hardware complication. Expected postoperative subcutaneous emphysema. Oral contrast material opacifies the colon. Scattered atherosclerotic vascular calcifications are noted.  IMPRESSION: ORIF left intertrochanteric femoral fracture without evidence of complication.   Electronically Signed   By: Malachy Moan M.D.   On: 07/20/2014 21:15   Ir Fluoro Rm 30-60 Min  07/23/2014   CLINICAL DATA:  Dysphagia, aspiration pneumonia. Request for enteral feeding support. Recent CT had demonstrated no safe percutaneous approach to the nondistended stomach due to overlying small bowel and colon.  EXAM: IR FLOURO RM 0-60 MIN  COMPARISON:  CT 07/21/2014  TECHNIQUE: Patient was positioned prone on the supine on the procedure table.  Intravenous Fentanyl and Versed were administered as conscious sedation during continuous cardiorespiratory monitoring by the radiology RN, with a total moderate sedation time of less than 30 minutes.  A 5 French angled angiographic catheter placed as orogastric tube to allow gastric insufflation with air. Under fluoroscopy, using multiple obliquities, it was apparent that the redundant transverse colon and loops of small bowel continued overlie the gastric body and antrum, such that no safe percutaneous approach was available for gastrostomy tube placement.  IMPRESSION: 1. No safe percutaneous approach for  gastrostomy tube placement due to overlying bowel. Consider surgical placement if needed.   Electronically Signed   By: Corlis Leak M.D.   On: 07/23/2014 08:10   Dg Chest  Port 1 View  07/22/2014   CLINICAL DATA:  Right lower lobe infiltrate. Increasing shortness of breath and chest congestion.  EXAM: PORTABLE CHEST - 1 VIEW  COMPARISON:  07/17/2014  FINDINGS: Slight improvement in the pneumonia in the right lower lobe. New accentuation of the interstitial markings at the left lung base. Probable small bilateral pleural effusions superimposed on emphysema. Tortuosity of the thoracic aorta. Overall heart size is normal.  IMPRESSION: Slight improvement in right lower lobe pneumonia. Interstitial accentuation at the left lung base could represent pneumonitis. Is the patient aspirating? COPD. Probable small effusions.   Electronically Signed   By: Francene Boyers M.D.   On: 07/22/2014 07:40   Dg Chest Port 1 View  07/17/2014   CLINICAL DATA:  Pt found on kitchen floor. Believed to be there for approx 4 hrs. Sts he fell but sat himself down, wasn't a "crash." Complains of sob and congestion x3 days. Was too weak to stand up from fall. Has been taking mucinex. Per neighbor, he started coughing up blood clots yesterday.  EXAM: PORTABLE CHEST - 1 VIEW  COMPARISON:  04/27/2004  FINDINGS: There is dense consolidation in knee right lower lung, projecting in the right lower lobe. This is new from the prior study.  Lungs are hyperexpanded consistent with underlying COPD. No other lung consolidation. No pulmonary edema. No pleural effusion or pneumothorax.  Cardiac silhouette is normal in size. Aorta is uncoiled. No mediastinal or hilar masses or convincing adenopathy.  Bony thorax is demineralized but intact.  IMPRESSION: Right lower lobe consolidation consistent with pneumonia. Underlying COPD.   Electronically Signed   By: Amie Portland M.D.   On: 07/17/2014 11:58   Dg Knee Left Port  07/19/2014   CLINICAL DATA:  Status  post fall.  EXAM: PORTABLE LEFT KNEE - 1-2 VIEW  COMPARISON:  None.  FINDINGS: There is no evidence of fracture, dislocation, or joint effusion. There is generalized osteopenia. There is no evidence of arthropathy or other focal bone abnormality. Soft tissues are unremarkable. There is peripheral vascular atherosclerotic disease.  IMPRESSION: No acute osseous injury of the left knee.   Electronically Signed   By: Elige Ko   On: 07/19/2014 20:25   Dg Swallowing Func-speech Pathology  07/27/2014    Objective Swallowing Evaluation:    Patient Details  Name: KAYLOR BORT MRN: [id number] Date of Birth: 04-19-1945  Today's Date: 07/27/2014 Time: SLP Start Time (ACUTE ONLY): 0950-SLP Stop Time (ACUTE ONLY): 1033 SLP Time Calculation (min) (ACUTE ONLY): 43 min  Past Medical History:  Past Medical History  Diagnosis Date  . Inguinal hernia   . COPD (chronic obstructive pulmonary disease)   . GERD (gastroesophageal reflux disease)   . Chronic hoarseness   . Tobacco abuse   . Cervical spinal stenosis     C4-C5; C5-C6; severe stenosis and abnormal  cord signal C6-C7 s/p  decompression in 04/2004   Past Surgical History:  Past Surgical History  Procedure Laterality Date  . Inguinal hernia repair    . Posterior laminectomy / decompression cervical spine  2006  . Intramedullary (im) nail intertrochanteric Left 07/20/2014    Procedure: INTRAMEDULLARY (IM) NAIL INTERTROCHANTRIC LEFT HIP;  Surgeon:  [name], MD;  Location: [facility];  Service: Orthopedics;  Laterality:  Left;   HPI:  Other Pertinent Information: Alfredo Diekmann is a 69 y.o. male, history of  COPD (not on oxygen), ongoing smoking, occasional alcohol use, history of  dysphagia on a soft diet per MBS in  September 2014, chronic hoarse voice,  GERD, generalized weakness and deconditioning.  Pt had fall with hip fx  requiring surgical repair.  PSH + for cervical decompression surgery in  2006, orthopedic sx during this admit.  Pt reported chronic problems with   hoarseness and dysphagia s/p cervical surgery 2006.  He also experienced  30 pound weight loss within 3 years-pt reported in 2014.  He lives at home  alone and receives help from neighbors for getting groceries and  activities of daily living.  The MBS in September 2014 did show trace  aspiration of thins and penetration of nectar liquids.  CXR showed right  lower lobe pna.    Pt had planned on getting PEG tube but today this was  discontinued. hospital. Unfortunately Dysphagia had not improved, after  discussion with patient, PEG tube placement was planned, because of the  anatomy this was not able to be done by interventional radiology, general  surgery planned PEG tube placement on 6/13-however patient/family were  hesistant to proceed-and is now tentatively scheduled for 07/28/14 per MD  note,  Pt continues to express desire to consume po intake and receive PEG  for nutrition.   No Data Recorded  Assessment / Plan / Recommendation CHL IP CLINICAL IMPRESSIONS 07/27/2014  Therapy Diagnosis Severe pharyngeal phase dysphagia;Mild cervical  esophageal phase dysphagia;Moderate pharyngeal phase dysphagia;Moderate  cervical esophageal phase dysphagia  Clinical Impression Cough noted with expectoration of copious secretions  after dry swallow indicative of swallow ability.    Pt continues with moderately severe sensorimotor pharyngo-cervical  esophageal dysphagia without significant improvement since testing earlier  in this admission.  Weakness in pharyngeal contraction and tongue base  retraction/poor epiglottic deflection continues resulting in gross  pharyngeal residuals without adequate pt sensation.  With liquids pt  conducts multiple reflexive swallows (x4 - with liquids) with extended  breathhold *presumed to be compensatory to help clear and prevent gross  aspiration.  Poor hyolaryngeal elevation allows laryngeal penetration of  liquids during the swallow.  Mild amount of aspiration after the swallow  noted as  barium spills posteior into open larynx from pyriform sinus.   Pt  did not consistently sense residuals today (mixed with secretions) but  cued swallow, liquid follow solids and cued expectoration (of secretions  mixed with barium) effective to clear 80%.     Using live video, educated pt to findings, effective compensations.  Also  recommend continue with suction to help pt with his secretion management.      Pt again expresses desire for po with risks known and pursuing feeding  tube for nutritional support only.  Pt understands feeding tube will not  prevent aspiration but dysphagia prevents him from obtaining adequate  nutrition via po. Recommend consider dys3/thin with strict precautions and  KNOWN aspiration risks per pt wishes.  Pt will require crushed or liquid  medications due to level of dysphagia.        No flowsheet data found.   CHL IP DIET RECOMMENDATION 07/27/2014  SLP Diet Recommendations Dysphagia 3 (Mech soft);Thin  Liquid Administration via Straw,cup  Medication Administration Crushed with puree  Compensations Slow rate;Small sips/bites;Follow solids with  liquid;Multiple dry swallows after each bite/sip, cough and expectoration  t/o meal as needed  Postural Changes and/or Swallow Maneuvers n/a     CHL IP OTHER RECOMMENDATIONS 07/27/2014  Recommended Consults (None)  Oral Care Recommendations Oral care QID  Other Recommendations (None)     CHL IP FOLLOW UP RECOMMENDATIONS 07/26/2014  Follow up  Recommendations Skilled Nursing facility     CHL IP FREQUENCY AND DURATION 07/27/2014  Speech Therapy Frequency (ACUTE ONLY) min 2x/week  Treatment Duration 1 week         CHL IP REASON FOR REFERRAL 07/27/2014  Reason for Referral Objectively evaluate swallowing function     CHL IP ORAL PHASE 07/27/2014                    Oral Phase WFL                                                                  CHL IP PHARYNGEAL PHASE 07/27/2014  Pharyngeal Phase Impaired                                                                                                                     Pharyngeal Comment multiple swallows (x3) across consistencies with  extended breath holding noted, mild laryngeal penetration noted during  swallow, trace aspiration noted after swallow due to spillage of barium  from pyriform sinus  into open larynx, following solids with liquids  helpful, cough and expectoration helpful to decrease residuals, head turn  right/left NOT helpful, limited postural changes available to use due to  cervical decompression sx in 06      CHL IP CERVICAL ESOPHAGEAL PHASE 07/27/2014  Cervical Esophageal Phase Impaired                                   Thin Straw Esophageal backflow into the pharynx;Other (Comment)     Cervical Esophageal Comment pt continues with poor clearance of cervical  esophagus with backflow of thin and puree without pt sensation, barium  mixed with secretions, per prior MBS in 2014 pt with narrowing of cervical  esophagus - suspect hardware impinging into cervical esophagus and  contributing to decreased clearance             Donavan Burnet, MS [facility] SLP [phone]    Dg Swallowing Func-speech Pathology  07/19/2014    Objective Swallowing Evaluation:    Patient Details  Name: Phillip May MRN: [id number] Date of Birth: [birth year]  Today's Date: 07/19/2014 Time: SLP Start Time (ACUTE ONLY): 1120-SLP Stop Time (ACUTE ONLY): 1155 SLP Time Calculation (min) (ACUTE ONLY): 35 min  Past Medical History:  Past Medical History  Diagnosis Date  . Inguinal hernia   . COPD (chronic obstructive pulmonary disease)   . GERD (gastroesophageal reflux disease)   . Chronic hoarseness   . Tobacco abuse   . Cervical spinal stenosis     C4-C5; C5-C6; severe stenosis and abnormal  cord signal C6-C7 s/p  decompression in 04/2004   Past Surgical History:  Past Surgical  History  Procedure Laterality Date  . Inguinal hernia repair    . Posterior laminectomy / decompression cervical spine  2006   HPI:  Other Pertinent Information: Phillip May is a 69 y.o. male, history of  COPD (not on oxygen), ongoing smoking, occasional alcohol use, history of  dysphagia on a soft diet per MBS in September 2014, chronic hoarse voice,  GERD, generalized weakness and deconditioning.  PSH + for cervical  decompression surgery in 2006.  Pt reported chronic problems with  hoarseness and dysphagia s/p cervical surgery.  He also experienced 30  pound weight loss within 3 years-pt reported in 2014.  He lives at home  alone and receives help from neighbors for getting groceries and  activities of daily living.  The MBS in September 2014 did show trace  aspiration of thins and penetration of nectar liquids.  CXR showed right  lower lobe pna.    No Data Recorded  Assessment / Plan / Recommendation CHL IP CLINICAL IMPRESSIONS 07/19/2014  Therapy Diagnosis Severe pharyngeal phase dysphagia;Severe cervical  esophageal phase dysphagia;Moderate pharyngeal phase dysphagia;Moderate  cervical esophageal phase dysphagia  Clinical Impression Known chronic dysphagia with suspected acute  exacerbation.  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    Weakness in pharyngeal contraction and tongue base retraction/poor  epiglottic deflection results in gross pharyngeal residuals that mix with  secretions without adequate pt sensation.  With liquids, pt conducts  multiple reflexive swallows (x4 - with liquids) with extended breathhold  *presumed to be compensatory to help clear and prevent gross aspiration.   Poor hyolaryngeal elevation allows laryngeal penetration of liquids during  the swallow.  Liquid swallows helpful to reduce solid food residuals in  pharynx.    Mild amount of aspiration (silent) after the swallow noted as barium  spills posterior into open larynx from pyriform sinus.   Pt did not sense  residuals today (mixed with secretions) but cued swallow, liquid follow  solids and cued expectoration (of secretions  mixed with barium) effective  to clear 80%.    Per previous MBS in 2014 *Dr Bradly Chris, radiologist stated findings  consistent with esophagram from 08/2012 indicating narrowing of lower  cervical esophagus and dilated cervical esophagus.  SLP today noted area  near cervical hardware where barium pools slightly, clears with reflexive  swallows.   Mr Dubie again reports desire to eat with known aspiration/malnutrition  risks.  Pt does admit to further weight loss of 5 pounds since 2014 and  states he has "good days and bad days" re: swallowing.  SLP  advised him  to monitor swallowing/nutrition closely to maximize  hydration/nutrition/airway safety.  Pt readily states liquid are easier  for him to consume.    To respect pt wishes, recommend dys3/thin with precautions.  Pt will  require crushed or liquid medications due to level of dysphagia.  Using  live video, educated pt to findings, effective compensations.  Also  recommend pt have suction set up to help with his secretion management.           No flowsheet data found.   CHL IP DIET RECOMMENDATION 07/19/2014  SLP Diet Recommendations Dysphagia 3 (Mech soft);Thin  Liquid Administration via cup  Medication Administration Crushed with puree  Compensations Slow rate;Small sips/bites;Follow solids with  liquid;Multiple dry swallows after each bite/sip, cough and expectorate  Postural Changes and/or Swallow Maneuvers (None)     CHL IP OTHER RECOMMENDATIONS 07/19/2014  Recommended Consults (None)  Oral Care Recommendations Oral care QID  Other Recommendations (None)     CHL IP FOLLOW UP RECOMMENDATIONS 10/28/2012  Follow up Recommendations None     CHL IP FREQUENCY AND DURATION 07/19/2014  Speech Therapy Frequency (ACUTE ONLY) min 2x/week  Treatment Duration 1 week         CHL IP REASON FOR REFERRAL 07/19/2014  Reason for Referral Objectively evaluate swallowing function     CHL IP ORAL PHASE 07/19/2014                    Oral Phase WFL                     CHL IP PHARYNGEAL PHASE  07/19/2014  Pharyngeal Phase Impaired  Pharyngeal Comment multiple swallows across consistencies, following solid  with liquids helpful to decrease vallecular residuals, cough and  expectoration helpful, limited postural changes available due to cervical  decompression sx in 06      Donavan Burnet, MS [facility] SLP [phone]    Dg C-arm 1-60 Min  07/20/2014   CLINICAL DATA:  69 year old male with left intertrochanteric femoral fracture undergoing ORIF  EXAM: DG C-ARM 61-120 MIN; LEFT FEMUR 2 VIEWS  COMPARISON:  Preoperative radiographs 07/19/2014  FINDINGS: For intraoperative spot radiographs demonstrate open reduction internal fixation of intertrochanteric femoral fracture with an intra medullary femoral nail and transfemoral neck gamma nail. No evidence of immediate hardware complication. There is a single distal interlocking screw.  IMPRESSION: ORIF of left intertrochanteric femoral fracture without evidence of immediate complication   Electronically Signed   By: Malachy Moan M.D.   On: 07/20/2014 20:39   Dg Hip Unilat With Pelvis 2-3 Views Left  07/19/2014   CLINICAL DATA:  Fall today with left hip pain, initial encounter  EXAM: LEFT HIP (WITH PELVIS) 2-3 VIEWS  COMPARISON:  None.  FINDINGS: There is an a minimally displaced intratrochanteric fracture of the proximal left femur. No dislocation is noted. Contrast material is seen from prior speech pathology examination. The pelvic ring is otherwise intact. No other focal abnormality is seen.  IMPRESSION: Mildly displaced left intratrochanteric femoral fracture.   Electronically Signed   By: Alcide Clever M.D.   On: 07/19/2014 17:34   Dg Femur Min 2 Views Left  07/20/2014   CLINICAL DATA:  Postop left hip fracture repair  EXAM: LEFT FEMUR 2 VIEWS  COMPARISON:  07/19/2014  FINDINGS: Internal fixation across the left intertrochanteric femoral fracture. Near anatomic alignment. No hardware or bony complicating feature.  IMPRESSION: Internal fixation across the left  femoral intertrochanteric fracture. No complicating feature.   Electronically Signed   By: Charlett Nose M.D.   On: 07/20/2014 21:20   Dg Femur Min 2 Views Left  07/20/2014   CLINICAL DATA:  69 year old male with left intertrochanteric femoral fracture undergoing ORIF  EXAM: DG C-ARM 61-120 MIN; LEFT FEMUR 2 VIEWS  COMPARISON:  Preoperative radiographs 07/19/2014  FINDINGS: For intraoperative spot radiographs demonstrate open reduction internal fixation of intertrochanteric femoral fracture with an intra medullary femoral nail and transfemoral neck gamma nail. No evidence of immediate hardware complication. There is a single distal interlocking screw.  IMPRESSION: ORIF of left intertrochanteric femoral fracture without evidence of immediate complication   Electronically Signed   By: Malachy Moan M.D.   On: 07/20/2014 20:39    Jeoffrey Massed, MD  [facility] Pager:[phone]  If 7PM-7AM, please contact night-coverage [email or web address] Password [email or web address] 07/29/2014, 12:18 PM   LOS: 12 days

## 2014-07-29 NOTE — Progress Notes (Addendum)
Patient ID: Phillip May, male   DOB: 10-Oct-1945, 69 y.o.   MRN: [id number] 1 Day Post-Op  Subjective: Pt feels well today,  Some pain, but well controlled.  hungry  Objective: Vital signs in last 24 hours: Temp:  [97.3 F (36.3 C)-98.8 F (37.1 C)] 98.8 F (37.1 C) (06/16 0455) Pulse Rate:  [69-91] 76 (06/16 0455) Resp:  [12-20] 18 (06/16 0455) BP: (100-141)/(55-85) 141/72 mmHg (06/16 0455) SpO2:  [93 %-100 %] 99 % (06/16 0822) Last BM Date: 07/28/14  Intake/Output from previous day: 06/15 0701 - 06/16 0700 In: 3391.7 [I.V.:3391.7] Out: 230 [Urine:230] Intake/Output this shift:    PE: Abd: soft, appropriately tender, g-tube in place and clamped off, umbilical incision is c/d/i with dermabond, +BS  Lab Results:  No results for input(s): WBC, HGB, HCT, PLT in the last 72 hours. BMET  Recent Labs  07/27/14 0441  CREATININE 0.64   PT/INR No results for input(s): LABPROT, INR in the last 72 hours. CMP     Component Value Date/Time   NA 135 07/24/2014 0930   K 3.6 07/24/2014 0930   CL 96* 07/24/2014 0930   CO2 30 07/24/2014 0930   GLUCOSE 99 07/24/2014 0930   BUN 8 07/24/2014 0930   CREATININE 0.64 07/27/2014 0441   CALCIUM 8.2* 07/24/2014 0930   PROT 7.2 07/17/2014 1206   ALBUMIN 3.0* 07/17/2014 1206   AST 26 07/17/2014 1206   ALT 18 07/17/2014 1206   ALKPHOS 76 07/17/2014 1206   BILITOT 0.8 07/17/2014 1206   GFRNONAA >60 07/27/2014 0441   GFRAA >60 07/27/2014 0441   Lipase  No results found for: LIPASE     Studies/Results: Dg Swallowing Func-speech Pathology  07/27/2014    Objective Swallowing Evaluation:    Patient Details  Name: Phillip May MRN: [id number] Date of Birth: 1946/01/24  Today's Date: 07/27/2014 Time: SLP Start Time (ACUTE ONLY): 0950-SLP Stop Time (ACUTE ONLY): 1033 SLP Time Calculation (min) (ACUTE ONLY): 43 min  Past Medical History:  Past Medical History  Diagnosis Date  . Inguinal hernia   . COPD (chronic obstructive pulmonary  disease)   . GERD (gastroesophageal reflux disease)   . Chronic hoarseness   . Tobacco abuse   . Cervical spinal stenosis     C4-C5; C5-C6; severe stenosis and abnormal  cord signal C6-C7 s/p  decompression in 04/2004   Past Surgical History:  Past Surgical History  Procedure Laterality Date  . Inguinal hernia repair    . Posterior laminectomy / decompression cervical spine  2006  . Intramedullary (im) nail intertrochanteric Left 07/20/2014    Procedure: INTRAMEDULLARY (IM) NAIL INTERTROCHANTRIC LEFT HIP;  Surgeon:  [name], MD;  Location: [facility] ORS;  Service: Orthopedics;  Laterality:  Left;   HPI:  Other Pertinent Information: Phillip May is a 69 y.o. male, history of  COPD (not on oxygen), ongoing smoking, occasional alcohol use, history of  dysphagia on a soft diet per MBS in September 2014, chronic hoarse voice,  GERD, generalized weakness and deconditioning.  Pt had fall with hip fx  requiring surgical repair.  PSH + for cervical decompression surgery in  2006, orthopedic sx during this admit.  Pt reported chronic problems with  hoarseness and dysphagia s/p cervical surgery 2006.  He also experienced  30 pound weight loss within 3 years-pt reported in 2014.  He lives at home  alone and receives help from neighbors for getting groceries and  activities of daily living.  The MBS in September 2014  did show trace  aspiration of thins and penetration of nectar liquids.  CXR showed right  lower lobe pna.    Pt had planned on getting PEG tube but today this was  discontinued. hospital. Unfortunately Dysphagia had not improved, after  discussion with patient, PEG tube placement was planned, because of the  anatomy this was not able to be done by interventional radiology, general  surgery planned PEG tube placement on 6/13-however patient/family were  hesistant to proceed-and is now tentatively scheduled for 07/28/14 per MD  note,  Pt continues to express desire to consume po intake and receive PEG  for nutrition.    No Data Recorded  Assessment / Plan / Recommendation CHL IP CLINICAL IMPRESSIONS 07/27/2014  Therapy Diagnosis Severe pharyngeal phase dysphagia;Mild cervical  esophageal phase dysphagia;Moderate pharyngeal phase dysphagia;Moderate  cervical esophageal phase dysphagia  Clinical Impression Cough noted with expectoration of copious secretions  after dry swallow indicative of swallow ability.    Pt continues with moderately severe sensorimotor pharyngo-cervical  esophageal dysphagia without significant improvement since testing earlier  in this admission.  Weakness in pharyngeal contraction and tongue base  retraction/poor epiglottic deflection continues resulting in gross  pharyngeal residuals without adequate pt sensation.  With liquids pt  conducts multiple reflexive swallows (x4 - with liquids) with extended  breathhold *presumed to be compensatory to help clear and prevent gross  aspiration.  Poor hyolaryngeal elevation allows laryngeal penetration of  liquids during the swallow.  Mild amount of aspiration after the swallow  noted as barium spills posteior into open larynx from pyriform sinus.   Pt  did not consistently sense residuals today (mixed with secretions) but  cued swallow, liquid follow solids and cued expectoration (of secretions  mixed with barium) effective to clear 80%.     Using live video, educated pt to findings, effective compensations.  Also  recommend continue with suction to help pt with his secretion management.      Pt again expresses desire for po with risks known and pursuing feeding  tube for nutritional support only.  Pt understands feeding tube will not  prevent aspiration but dysphagia prevents him from obtaining adequate  nutrition via po. Recommend consider dys3/thin with strict precautions and  KNOWN aspiration risks per pt wishes.  Pt will require crushed or liquid  medications due to level of dysphagia.        No flowsheet data found.   CHL IP DIET RECOMMENDATION 07/27/2014  SLP  Diet Recommendations Dysphagia 3 (Mech soft);Thin  Liquid Administration via Straw,cup  Medication Administration Crushed with puree  Compensations Slow rate;Small sips/bites;Follow solids with  liquid;Multiple dry swallows after each bite/sip, cough and expectoration  t/o meal as needed  Postural Changes and/or Swallow Maneuvers n/a     CHL IP OTHER RECOMMENDATIONS 07/27/2014  Recommended Consults (None)  Oral Care Recommendations Oral care QID  Other Recommendations (None)     CHL IP FOLLOW UP RECOMMENDATIONS 07/26/2014  Follow up Recommendations Skilled Nursing facility     [facility] IP FREQUENCY AND DURATION 07/27/2014  Speech Therapy Frequency (ACUTE ONLY) min 2x/week  Treatment Duration 1 week         CHL IP REASON FOR REFERRAL 07/27/2014  Reason for Referral Objectively evaluate swallowing function     CHL IP ORAL PHASE 07/27/2014                    Oral Phase [facility]  CHL IP PHARYNGEAL PHASE 07/27/2014  Pharyngeal Phase Impaired                                                                                                                    Pharyngeal Comment multiple swallows (x3) across consistencies with  extended breath holding noted, mild laryngeal penetration noted during  swallow, trace aspiration noted after swallow due to spillage of barium  from pyriform sinus  into open larynx, following solids with liquids  helpful, cough and expectoration helpful to decrease residuals, head turn  right/left NOT helpful, limited postural changes available to use due to  cervical decompression sx in 06      CHL IP CERVICAL ESOPHAGEAL PHASE 07/27/2014  Cervical Esophageal Phase Impaired                                   Thin Straw Esophageal backflow into the pharynx;Other (Comment)     Cervical Esophageal Comment pt continues with poor clearance of cervical  esophagus with backflow of thin and puree without pt sensation, barium  mixed with secretions, per prior MBS  in 2014 pt with narrowing of cervical  esophagus - suspect hardware impinging into cervical esophagus and  contributing to decreased clearance             Donavan Burnet, MS [facility] SLP [phone]     Anti-infectives: Anti-infectives    Start     Dose/Rate Route Frequency Ordered Stop   07/28/14 0809  [MAR Hold]  ceFAZolin (ANCEF) IVPB 2 g/50 mL premix     (MAR Hold since 07/28/14 0810)   2 g 100 mL/hr over 30 Minutes Intravenous 30 min pre-op 07/28/14 0735 07/28/14 0923   07/26/14 0000  metroNIDAZOLE (FLAGYL) IVPB 500 mg  Status:  Discontinued    Comments:  Pharmacy may adjust dosing strength, interval, or rate of medication as needed for optimal therapy for the patient Send with patient on call to the OR.  Anesthesia to complete antibiotic administration <62min prior to incision per [facility].   500 mg 100 mL/hr over 60 Minutes Intravenous 30 min pre-op 07/25/14 1043 07/28/14 0735   07/21/14 0000  ceFAZolin (ANCEF) IVPB 2 g/50 mL premix     2 g 100 mL/hr over 30 Minutes Intravenous Every 6 hours 07/20/14 2139 07/21/14 0646   07/20/14 1000  ceFAZolin (ANCEF) IVPB 2 g/50 mL premix     2 g 100 mL/hr over 30 Minutes Intravenous  Once 07/19/14 2133 07/20/14 1833   07/19/14 1000  ampicillin-sulbactam (UNASYN) 1.5 g in sodium chloride 0.9 % 50 mL IVPB  Status:  Discontinued     1.5 g 100 mL/hr over 30 Minutes Intravenous Every 6 hours 07/19/14 0812 07/26/14 1027   07/18/14 1400  azithromycin (ZITHROMAX) 500 mg in dextrose 5 % 250 mL IVPB  Status:  Discontinued     500 mg 250 mL/hr over 60 Minutes Intravenous Every 24 hours 07/17/14 1612  07/23/14 0841   07/18/14 0200  ampicillin-sulbactam (UNASYN) 1.5 g in sodium chloride 0.9 % 50 mL IVPB  Status:  Discontinued     1.5 g 100 mL/hr over 30 Minutes Intravenous Every 8 hours 07/17/14 1621 07/19/14 0812   07/18/14 0000  azithromycin (ZITHROMAX) 500 mg in dextrose 5 % 250 mL IVPB  Status:  Discontinued     500 mg 250 mL/hr over 60 Minutes  Intravenous Every 24 hours 07/17/14 1344 07/17/14 1612   07/17/14 1500  Ampicillin-Sulbactam (UNASYN) 3 g in sodium chloride 0.9 % 100 mL IVPB     3 g 100 mL/hr over 60 Minutes Intravenous  Once 07/17/14 1432 07/17/14 1900   07/17/14 1230  cefTRIAXone (ROCEPHIN) 1 g in dextrose 5 % 50 mL IVPB  Status:  Discontinued     1 g 100 mL/hr over 30 Minutes Intravenous  Once 07/17/14 1219 07/17/14 1416   07/17/14 1230  azithromycin (ZITHROMAX) 500 mg in dextrose 5 % 250 mL IVPB     500 mg 250 mL/hr over 60 Minutes Intravenous  Once 07/17/14 1219 07/17/14 1550       Assessment/Plan  POD 1, s/p lap assisted PEG placement - 07/28/2014 - D. Carrigan Delafuente  -trickle TFs today, may advance to goal tomorrow if tolerates trickle TF today -diet for comfort -may give meds down g-tube but they need to be crushed and flushed.   LOS: 12 days    OSBORNE,KELLY E 07/29/2014, 9:45 AM Pager: [phone]  Agree with above. Tolerating initial tube feedings.  Minimal pain.  Ovidio Kin, MD, [facility] Surgery Pager: [phone] Office phone:  [phone]

## 2014-07-29 NOTE — Progress Notes (Signed)
Physical Therapy Treatment Patient Details Name: Phillip May MRN: [id number] DOB: February 18, 1945 Today's Date: 07/29/2014    History of Present Illness 69 yo male admitted 07/17/14 with Pna, fall. Hx of COPD, cervical spinal stenosis. Pt is from home alone. Pt s/p fall on 07/19/14 with L intertrochantric fracture and underwent IM nailing.    PT Comments    Pt required increased time and caution due to multiple lines/leads and new Peg.  Assisted from supine bed to recliner and positioned to comfort.  Pillows under buttocks and spine to offer pressure relief.   Pt was home alone and plans to D/C to SNF for ST Rehab.  Follow Up Recommendations  SNF     Equipment Recommendations       Recommendations for Other Services       Precautions / Restrictions Precautions Precautions: Fall Restrictions Weight Bearing Restrictions: No LLE Weight Bearing: Weight bearing as tolerated Other Position/Activity Restrictions: WBAT L LE    Mobility  Bed Mobility Overal bed mobility: Needs Assistance Bed Mobility: Supine to Sit     Supine to sit: Max assist;Total assist (+ 1)     General bed mobility comments: + 1 total/max assist using bed pad to complete swival and scoot to EOB.  Once B LE's on floor, pt was able to sit EOB x 5 min but with poor foward flex posture and mod posterior lean.  Pt fearful of falling.   Transfers Overall transfer level: Needs assistance Equipment used: None Transfers: Squat Pivot Transfers           General transfer comment: using "bear Hug" stand pivot, transfered pt from elevated bed to recliner 1/4 pivot to pt's R.  Pt was able to support self in standing however poor posture of flex hips/knees/trunk.   Ambulation/Gait                 Stairs            Wheelchair Mobility    Modified Rankin (Stroke Patients Only)       Balance                                    Cognition Arousal/Alertness: Awake/alert Behavior  During Therapy: WFL for tasks assessed/performed Overall Cognitive Status: Within Functional Limits for tasks assessed                      Exercises      General Comments        Pertinent Vitals/Pain Pain Assessment: No/denies pain    Home Living                      Prior Function            PT Goals (current goals can now be found in the care plan section) Progress towards PT goals: Progressing toward goals    Frequency  Min 3X/week    PT Plan Current plan remains appropriate    Co-evaluation             End of Session Equipment Utilized During Treatment: Gait belt Activity Tolerance: Patient limited by fatigue Patient left: in chair;with call bell/phone within reach     Time: 1312-1336 PT Time Calculation (min) (ACUTE ONLY): 24 min  Charges:  $Therapeutic Activity: 23-37 mins  G Codes:      Rica Koyanagi  PTA [facility]  Acute  Rehab Pager      [phone]

## 2014-07-29 NOTE — Progress Notes (Signed)
NUTRITION NOTE  New consult for RD to initiate and manage TF. Pt had PEG placed yesterday (07/28/14). Consult indicates: "please start trickle feeds today, Dr. Ezzard Standing does not want these advanced until tomorrow (07/30/14)."  Previous recommendations for Osmolite 1.2 @ 50 mL/hr to provide 1440 kcal, 67 grams protein, and 984 mL free water. Will order Osmolite 1.2 @ 10 mL/hr and see for full follow-up tomorrow and advance TF rate at that time.  Will continue to follow per protocol.    Trenton Gammon, RD, LDN Inpatient Clinical Dietitian Pager # [phone] After hours/weekend pager # [phone]

## 2014-07-30 ENCOUNTER — Encounter (HOSPITAL_COMMUNITY): Payer: Self-pay | Admitting: Surgery

## 2014-07-30 LAB — GLUCOSE, CAPILLARY
Glucose-Capillary: 113 mg/dL — ABNORMAL HIGH (ref 65–99)
Glucose-Capillary: 101 mg/dL — ABNORMAL HIGH (ref 65–99)
Glucose-Capillary: 87 mg/dL (ref 65–99)

## 2014-07-30 MED ORDER — THIAMINE HCL 100 MG PO TABS: 100.0000 mg | ORAL_TABLET | Freq: Every day | ORAL | Status: DC

## 2014-07-30 MED ORDER — NEBIVOLOL HCL 5 MG PO TABS: 5.0000 mg | ORAL_TABLET | Freq: Every day | ORAL | Status: DC

## 2014-07-30 MED ORDER — ENOXAPARIN SODIUM 30 MG/0.3ML ~~LOC~~ SOLN: 30.0000 mg | SUBCUTANEOUS | Status: DC

## 2014-07-30 MED ORDER — TIOTROPIUM BROMIDE MONOHYDRATE 18 MCG IN CAPS: 18.0000 ug | ORAL_CAPSULE | Freq: Every day | RESPIRATORY_TRACT | Status: DC

## 2014-07-30 MED ORDER — OSMOLITE 1.2 CAL PO LIQD: 1000.0000 mL | ORAL | Status: DC

## 2014-07-30 MED ORDER — ADULT MULTIVITAMIN W/MINERALS CH: 1.0000 | ORAL_TABLET | Freq: Every day | ORAL | Status: DC

## 2014-07-30 MED ORDER — FOLIC ACID 1 MG PO TABS: 1.0000 mg | ORAL_TABLET | Freq: Every day | ORAL | Status: DC

## 2014-07-30 MED ORDER — HYDROCODONE-ACETAMINOPHEN 5-325 MG PO TABS: 1.0000 | ORAL_TABLET | Freq: Four times a day (QID) | ORAL | Status: DC | PRN

## 2014-07-30 MED ORDER — LEVALBUTEROL HCL 1.25 MG/0.5ML IN NEBU: 1.2500 mg | INHALATION_SOLUTION | Freq: Three times a day (TID) | RESPIRATORY_TRACT | Status: DC

## 2014-07-30 MED ORDER — OSMOLITE 1.2 CAL PO LIQD
ORAL | Status: DC
  Filled 2014-07-30: qty 1000

## 2014-07-30 NOTE — Progress Notes (Signed)
Nutrition Follow-up  DOCUMENTATION CODES:  Severe malnutrition in context of chronic illness, Underweight  INTERVENTION: - Will increase order to reflect goal rate: Osmolite 1.2 @ 50 mL/hr to provide 1440 kcal, 67 grams protein, and 984 ml free water; increase by 10 ml Q4h. - RD will continue to monitor for needs  NUTRITION DIAGNOSIS:  Malnutrition related to chronic illness as evidenced by severe depletion of body fat, severe depletion of muscle mass. -ongoing  GOAL:  Patient will meet greater than or equal to 90% of their needs -unmet  MONITOR:  TF tolerance, Weight trends, Labs, I & O's  ASSESSMENT: 69 y.o. male, history of COPD follows with Dr. Marchelle Gearing not on oxygen, ongoing smoking, occasional alcohol use, history of dysphagia on a soft diet per speech therapy eval in 2014, chronic hoarse voice, GERD, generalized weakness and deconditioning.   6/17: - D/c summary in for pt to d/c to SNF when bed available - Pt currently receiving Osmolite 1.2 @ 20 mL/hr (despite order to not advance over 10 mL/hr) - Per surgery note, okay to advance to TF goal rate today which will meet needs - PO diet for comfort/pleasure - Medications reviewed. No recent labs  6/14:  PEG was to be placed by surgery yesterday, but per RN note yesterday, "family would like to delay surgery until they have had a chance to discuss the situation further." SLP note yesterday recommended pt be NPO. Per surgery note this AM, pt "says he is agreeable to G-tube, but he is getting a swallow test today."  Pt unable to meet needs. TF recommendations as outlined above. Medications reviewed. Labs reviewed; Cl: 96 mmol/L.  6/10: - Pt admitted with aspiration/CAP.  - Pt remains on CIWA protocol. - Left hip fx due to fall.  - SLP following- NPO except meds. Pt now wishes for peg. - Unable to be placed by IR. Surgery consulted- pt is candidate.  - Pt is at risk for refeeding given severe malnutrition and poor  po.  - RD will continue to monitor for peg/g-tube placement and initiation of TF.  Height:  Ht Readings from Last 1 Encounters:  07/23/14 5\' 7"  (1.702 m)    Weight:  Wt Readings from Last 1 Encounters:  07/30/14 101 lb 6.6 oz (46 kg)    Ideal Body Weight:  67.2 kg  Wt Readings from Last 10 Encounters:  07/30/14 101 lb 6.6 oz (46 kg)  05/21/11 126 lb (57.153 kg)  04/23/11 131 lb (59.421 kg)  03/13/11 128 lb 9.6 oz (58.333 kg)    BMI:  Body mass index is 15.88 kg/(m^2).  Estimated Nutritional Needs:  Kcal:  1300-1500  Protein:  65-75g  Fluid:  1.5L/day  Skin:  Wound (see comment) (closed incision on left thigh)  Diet Order:  DIET DYS 3 Room service appropriate?: Yes; Fluid consistency:: Thin Diet - low sodium heart healthy  EDUCATION NEEDS:  No education needs identified at this time   Intake/Output Summary (Last 24 hours) at 07/30/14 1047 Last data filed at 07/30/14 0800  Gross per 24 hour  Intake 2709.67 ml  Output    700 ml  Net 2009.67 ml    Last BM:  6/14   Trenton Gammon, RD, LDN Inpatient Clinical Dietitian Pager # [phone] After hours/weekend pager # [phone]

## 2014-07-30 NOTE — Progress Notes (Signed)
Attempted to call report to accepting RN at [facility]. Told RN on lunch break. Delford Field, RN

## 2014-07-30 NOTE — Progress Notes (Signed)
Report called to accepting RN at [facility]. Delford Field, RN

## 2014-07-30 NOTE — Discharge Summary (Signed)
PATIENT DETAILS Name: Phillip May Age: 69 y.o. Sex: male Date of Birth: June 09, 1945 MRN: [id number]. Admitting Physician: Leroy Sea, MD OZH:YQMVH,QIONGE DAVIDSON, MD  Admit Date: 07/17/2014 Discharge date: 07/30/2014  Recommendations for Outpatient Follow-up:  Repeat CBC and BMET in 1 week Ensure follow up with Ortho and Gen surgery-see below appt  PRIMARY DISCHARGE DIAGNOSIS:  Principal Problem:   CAP (community acquired pneumonia) Active Problems:   Tobacco abuse   COPD, very severe   Dysphagia   Closed comminuted intertrochanteric fracture of proximal femur   Protein-calorie malnutrition   Dysphagia, unspecified(787.20)   Unspecified protein-calorie malnutrition      PAST MEDICAL HISTORY: Past Medical History  Diagnosis Date  . Inguinal hernia   . COPD (chronic obstructive pulmonary disease)   . GERD (gastroesophageal reflux disease)   . Chronic hoarseness   . Tobacco abuse   . Cervical spinal stenosis     C4-C5; C5-C6; severe stenosis and abnormal  cord signal C6-C7 s/p decompression in 04/2004    DISCHARGE MEDICATIONS: Current Discharge Medication List    START taking these medications   Details  enoxaparin (LOVENOX) 30 MG/0.3ML injection Inject 0.3 mLs (30 mg total) into the skin daily. FOR 30 DAYS FROM 07/20/14 and then stop Qty: 0 Syringe    folic acid (FOLVITE) 1 MG tablet Take 1 tablet (1 mg total) by mouth daily.    HYDROcodone-acetaminophen (NORCO/VICODIN) 5-325 MG per tablet Take 1-2 tablets by mouth every 6 (six) hours as needed for moderate pain. Qty: 30 tablet, Refills: 0    levalbuterol (XOPENEX) 1.25 MG/0.5ML nebulizer solution Take 1.25 mg by nebulization 3 (three) times daily.    Multiple Vitamin (MULTIVITAMIN WITH MINERALS) TABS tablet Take 1 tablet by mouth daily.    nebivolol (BYSTOLIC) 5 MG tablet Take 1 tablet (5 mg total) by mouth daily.    Nutritional Supplements (FEEDING SUPPLEMENT, OSMOLITE 1.2 CAL,) LIQD Place 1,000 mLs  into feeding tube continuous. Goal of 40 cc/hr Refills: 0    thiamine 100 MG tablet Take 1 tablet (100 mg total) by mouth daily.      CONTINUE these medications which have CHANGED   Details  tiotropium (SPIRIVA HANDIHALER) 18 MCG inhalation capsule Place 1 capsule (18 mcg total) into inhaler and inhale daily.      CONTINUE these medications which have NOT CHANGED   Details  budesonide-formoterol (SYMBICORT) 160-4.5 MCG/ACT inhaler Inhale 2 puffs into the lungs 2 (two) times daily.    guaiFENesin (MUCINEX) 600 MG 12 hr tablet Take 600 mg by mouth daily as needed for cough or to loosen phlegm.      STOP taking these medications     AZOR 5-40 MG per tablet      ALPRAZolam (XANAX) 1 MG tablet         ALLERGIES:  No Known Allergies  BRIEF HPI:  See H&P, Labs, Consult and Test reports for all details in brief, patient 69 year old male with history of COPD, EtOH use, tobacco abuse, chronic dysphagia, GERD who lives at home by himself with the help of neighbors was found on the floor on the day of admission and brought to the emergency room. Found to have acute on chronic respiratory failure likely secondary to presumed aspiration pneumonia, patient was subsequently admitted to the hospital.  CONSULTATIONS:   general surgery, orthopedic surgery and IR  PERTINENT RADIOLOGIC STUDIES: Ct Abdomen Wo Contrast  08/08/14   CLINICAL DATA:  69 year old male with generalized weakness. Evaluate anatomy prior to gastrostomy tube  placement. History of dysphagia.  EXAM: CT ABDOMEN WITHOUT CONTRAST  TECHNIQUE: Multidetector CT imaging of the abdomen was performed following the standard protocol without IV contrast.  COMPARISON:  No priors.  FINDINGS: Lower chest: Areas of airspace consolidation in the right middle lobe and right lower lobe. Small right pleural effusion layering dependently. Areas of bronchiectasis and potential cavitation in the posterior aspect of the right lower lobe, poorly  evaluated on today's noncontrast CT examination. 1.6 x 1.0 cm nodular density in the medial aspect of the left lower lobe (image 13 of series 5). Trace left pleural effusion. Atherosclerotic calcifications in the left anterior descending and right coronary arteries.  Hepatobiliary: No definite cystic or solid hepatic lesions are identified on today's noncontrast CT examination. Gallbladder is not confidently identified and could be collapsed or surgically absent (no surgical clips are noted in the region of the gallbladder fossa).  Pancreas: Pancreas is poorly depicted, but grossly unremarkable.  Spleen: Unremarkable.  Adrenals/Urinary Tract: The unenhanced appearance of the kidneys and adrenal glands is normal bilaterally. No hydroureteronephrosis.  Stomach/Bowel: The unenhanced appearance of the visualized portions of the stomach is unremarkable. Importantly, however, there are loops of small bowel and colon superficial to the visualized portions of the stomach. No pathologic dilatation of the visualized portions of the small bowel or colon.  Vascular/Lymphatic: Atherosclerosis throughout the visualized abdominal vasculature, without definite aneurysm. No definite lymphadenopathy in the visualized portions of the abdomen on today's noncontrast CT examination.  Other: No significant volume of ascites or definite pneumoperitoneum identified in the abdomen.  Musculoskeletal: Chronic appearing compression fracture of L1 with approximately 20% loss of anterior vertebral body height. There are no aggressive appearing lytic or blastic lesions noted in the visualized portions of the skeleton.  IMPRESSION: 1. Stomach is incompletely visualized on today's examination. The visualized portions of the stomach are remarkable for superficial loops of small bowel and colon interposed between the stomach in the anterior abdominal wall. 2. Airspace consolidation in the right middle and lower lobes concerning for acute infection.  This appears be associated with some areas of bronchiectasis and potential cavitation in the posterior aspect of the right lower lobe. There is also a small right parapneumonic pleural effusion. 3. Pleural-based 1.6 x 1.0 cm nodular density in the medial aspect of the left lower lobe. This could also be infectious or inflammatory in etiology, however, the possibility of neoplasm is not excluded, and close attention on future followup examinations is recommended. There is also trace left pleural effusion. At this time, noncontrast chest CT is recommended in 2-3 weeks to assess for resolution of the right lower/middle lobe pneumonia following trial of antimicrobial therapy, and to reassess this left lower lobe nodular region. 4. Extensive atherosclerosis, including at least 2 vessel coronary artery disease. Please note that although the presence of coronary artery calcium documents the presence of coronary artery disease, the severity of this disease and any potential stenosis cannot be assessed on this non-gated CT examination. Assessment for potential risk factor modification, dietary therapy or pharmacologic therapy may be warranted, if clinically indicated. 5. Additional incidental findings, as above.   Electronically Signed   By: Trudie Reed M.D.   On: 07/21/2014 20:26   Ct Head Wo Contrast  07/19/2014   CLINICAL DATA:  Headache, hallucinating objects  EXAM: CT HEAD WITHOUT CONTRAST  TECHNIQUE: Contiguous axial images were obtained from the base of the skull through the vertex without intravenous contrast.  COMPARISON:  None  FINDINGS: There is no  evidence of mass effect, midline shift, or extra-axial fluid collections. There is no evidence of a space-occupying lesion or intracranial hemorrhage. There is no evidence of a cortical-based area of acute infarction. 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 There is generalized cerebral atrophy. There is periventricular white matter low attenuation  likely secondary to microangiopathy.  The ventricles and sulci are appropriate for the patient's age. The basal cisterns are patent.  Visualized portions of the orbits are unremarkable. There is near complete opacification of the left maxillary sinus. There is an air-fluid level in the right sphenoid sinus. There is mild left ethmoid sinus mucosal thickening. Cerebrovascular atherosclerotic calcifications are noted.  The osseous structures are unremarkable.  IMPRESSION: 1. No acute intracranial pathology. 2. Chronic microvascular disease and cerebral atrophy. 3. Right sphenoid and left maxillary sinus disease. Mild left ethmoid sinus disease.   Electronically Signed   By: Elige Ko   On: 07/19/2014 18:01   Pelvis Portable  07/20/2014   CLINICAL DATA:  69 year old status post left hip repair  EXAM: PORTABLE PELVIS 1-2 VIEWS  COMPARISON:  Preoperative radiographs 07/19/2014  FINDINGS: Interval ORIF of intertrochanteric left femoral fracture with intra medullary nail and a transfemoral neck gamma nail. Improved alignment of the fracture fragments. No evidence of acute hardware complication. Expected postoperative subcutaneous emphysema. Oral contrast material opacifies the colon. Scattered atherosclerotic vascular calcifications are noted.  IMPRESSION: ORIF left intertrochanteric femoral fracture without evidence of complication.   Electronically Signed   By: Malachy Moan M.D.   On: 07/20/2014 21:15   Ir Fluoro Rm 30-60 Min  07/23/2014   CLINICAL DATA:  Dysphagia, aspiration pneumonia. Request for enteral feeding support. Recent CT had demonstrated no safe percutaneous approach to the nondistended stomach due to overlying small bowel and colon.  EXAM: IR FLOURO RM 0-60 MIN  COMPARISON:  CT 07/21/2014  TECHNIQUE: Patient was positioned prone on the supine on the procedure table.  Intravenous Fentanyl and Versed were administered as conscious sedation during continuous cardiorespiratory monitoring by the  radiology RN, with a total moderate sedation time of less than 30 minutes.  A 5 French angled angiographic catheter placed as orogastric tube to allow gastric insufflation with air. Under fluoroscopy, using multiple obliquities, it was apparent that the redundant transverse colon and loops of small bowel continued overlie the gastric body and antrum, such that no safe percutaneous approach was available for gastrostomy tube placement.  IMPRESSION: 1. No safe percutaneous approach for gastrostomy tube placement due to overlying bowel. Consider surgical placement if needed.   Electronically Signed   By: Corlis Leak M.D.   On: 07/23/2014 08:10   Dg Chest Port 1 View  07/22/2014   CLINICAL DATA:  Right lower lobe infiltrate. Increasing shortness of breath and chest congestion.  EXAM: PORTABLE CHEST - 1 VIEW  COMPARISON:  07/17/2014  FINDINGS: Slight improvement in the pneumonia in the right lower lobe. New accentuation of the interstitial markings at the left lung base. Probable small bilateral pleural effusions superimposed on emphysema. Tortuosity of the thoracic aorta. Overall heart size is normal.  IMPRESSION: Slight improvement in right lower lobe pneumonia. Interstitial accentuation at the left lung base could represent pneumonitis. Is the patient aspirating? COPD. Probable small effusions.   Electronically Signed   By: Francene Boyers M.D.   On: 07/22/2014 07:40   Dg Chest Port 1 View  07/17/2014   CLINICAL DATA:  Pt found on kitchen floor. Believed to be there for approx 4 hrs. Sts he  fell but sat himself down, wasn't a "crash." Complains of sob and congestion x3 days. Was too weak to stand up from fall. Has been taking mucinex. Per neighbor, he started coughing up blood clots yesterday.  EXAM: PORTABLE CHEST - 1 VIEW  COMPARISON:  04/27/2004  FINDINGS: There is dense consolidation in knee right lower lung, projecting in the right lower lobe. This is new from the prior study.  Lungs are hyperexpanded  consistent with underlying COPD. No other lung consolidation. No pulmonary edema. No pleural effusion or pneumothorax.  Cardiac silhouette is normal in size. Aorta is uncoiled. No mediastinal or hilar masses or convincing adenopathy.  Bony thorax is demineralized but intact.  IMPRESSION: Right lower lobe consolidation consistent with pneumonia. Underlying COPD.   Electronically Signed   By: Amie Portland M.D.   On: 07/17/2014 11:58   Dg Knee Left Port  07/19/2014   CLINICAL DATA:  Status post fall.  EXAM: PORTABLE LEFT KNEE - 1-2 VIEW  COMPARISON:  None.  FINDINGS: There is no evidence of fracture, dislocation, or joint effusion. There is generalized osteopenia. There is no evidence of arthropathy or other focal bone abnormality. Soft tissues are unremarkable. There is peripheral vascular atherosclerotic disease.  IMPRESSION: No acute osseous injury of the left knee.   Electronically Signed   By: Elige Ko   On: 07/19/2014 20:25   Dg Swallowing Func-speech Pathology  07/27/2014    Objective Swallowing Evaluation:    Patient Details  Name: Phillip May MRN: [id number] Date of Birth: [birth year]  Today's Date: 07/27/2014 Time: SLP Start Time (ACUTE ONLY): 0950-SLP Stop Time (ACUTE ONLY): 1033 SLP Time Calculation (min) (ACUTE ONLY): 43 min  Past Medical History:  Past Medical History  Diagnosis Date  . Inguinal hernia   . COPD (chronic obstructive pulmonary disease)   . GERD (gastroesophageal reflux disease)   . Chronic hoarseness   . Tobacco abuse   . Cervical spinal stenosis     C4-C5; C5-C6; severe stenosis and abnormal  cord signal C6-C7 s/p  decompression in 04/2004   Past Surgical History:  Past Surgical History  Procedure Laterality Date  . Inguinal hernia repair    . Posterior laminectomy / decompression cervical spine  2006  . Intramedullary (im) nail intertrochanteric Left 07/20/2014    Procedure: INTRAMEDULLARY (IM) NAIL INTERTROCHANTRIC LEFT HIP;  Surgeon:  [name], MD;  Location: [facility];   Service: Orthopedics;  Laterality:  Left;   HPI:  Other Pertinent Information: Phuoc Huy is a 69 y.o. male, history of  COPD (not on oxygen), ongoing smoking, occasional alcohol use, history of  dysphagia on a soft diet per MBS in September 2014, chronic hoarse voice,  GERD, generalized weakness and deconditioning.  Pt had fall with hip fx  requiring surgical repair.  PSH + for cervical decompression surgery in  2006, orthopedic sx during this admit.  Pt reported chronic problems with  hoarseness and dysphagia s/p cervical surgery 2006.  He also experienced  30 pound weight loss within 3 years-pt reported in 2014.  He lives at home  alone and receives help from neighbors for getting groceries and  activities of daily living.  The MBS in September 2014 did show trace  aspiration of thins and penetration of nectar liquids.  CXR showed right  lower lobe pna.    Pt had planned on getting PEG tube but today this was  discontinued. hospital. Unfortunately Dysphagia had not improved, after  discussion with patient, PEG tube placement was  planned, because of the  anatomy this was not able to be done by interventional radiology, general  surgery planned PEG tube placement on 6/13-however patient/family were  hesistant to proceed-and is now tentatively scheduled for 07/28/14 per MD  note,  Pt continues to express desire to consume po intake and receive PEG  for nutrition.   No Data Recorded  Assessment / Plan / Recommendation CHL IP CLINICAL IMPRESSIONS 07/27/2014  Therapy Diagnosis Severe pharyngeal phase dysphagia;Mild cervical  esophageal phase dysphagia;Moderate pharyngeal phase dysphagia;Moderate  cervical esophageal phase dysphagia  Clinical Impression Cough noted with expectoration of copious secretions  after dry swallow indicative of swallow ability.    Pt continues with moderately severe sensorimotor pharyngo-cervical  esophageal dysphagia without significant improvement since testing earlier  in this admission.   Weakness in pharyngeal contraction and tongue base  retraction/poor epiglottic deflection continues resulting in gross  pharyngeal residuals without adequate pt sensation.  With liquids pt  conducts multiple reflexive swallows (x4 - with liquids) with extended  breathhold *presumed to be compensatory to help clear and prevent gross  aspiration.  Poor hyolaryngeal elevation allows laryngeal penetration of  liquids during the swallow.  Mild amount of aspiration after the swallow  noted as barium spills posteior into open larynx from pyriform sinus.   Pt  did not consistently sense residuals today (mixed with secretions) but  cued swallow, liquid follow solids and cued expectoration (of secretions  mixed with barium) effective to clear 80%.     Using live video, educated pt to findings, effective compensations.  Also  recommend continue with suction to help pt with his secretion management.      Pt again expresses desire for po with risks known and pursuing feeding  tube for nutritional support only.  Pt understands feeding tube will not  prevent aspiration but dysphagia prevents him from obtaining adequate  nutrition via po. Recommend consider dys3/thin with strict precautions and  KNOWN aspiration risks per pt wishes.  Pt will require crushed or liquid  medications due to level of dysphagia.        No flowsheet data found.   CHL IP DIET RECOMMENDATION 07/27/2014  SLP Diet Recommendations Dysphagia 3 (Mech soft);Thin  Liquid Administration via Straw,cup  Medication Administration Crushed with puree  Compensations Slow rate;Small sips/bites;Follow solids with  liquid;Multiple dry swallows after each bite/sip, cough and expectoration  t/o meal as needed  Postural Changes and/or Swallow Maneuvers n/a     CHL IP OTHER RECOMMENDATIONS 07/27/2014  Recommended Consults (None)  Oral Care Recommendations Oral care QID  Other Recommendations (None)     CHL IP FOLLOW UP RECOMMENDATIONS 07/26/2014  Follow up Recommendations Skilled  Nursing facility     [facility] IP FREQUENCY AND DURATION 07/27/2014  Speech Therapy Frequency (ACUTE ONLY) min 2x/week  Treatment Duration 1 week         CHL IP REASON FOR REFERRAL 07/27/2014  Reason for Referral Objectively evaluate swallowing function     CHL IP ORAL PHASE 07/27/2014                    Oral Phase [facility]  CHL IP PHARYNGEAL PHASE 07/27/2014  Pharyngeal Phase Impaired                                                                                                                    Pharyngeal Comment multiple swallows (x3) across consistencies with  extended breath holding noted, mild laryngeal penetration noted during  swallow, trace aspiration noted after swallow due to spillage of barium  from pyriform sinus  into open larynx, following solids with liquids  helpful, cough and expectoration helpful to decrease residuals, head turn  right/left NOT helpful, limited postural changes available to use due to  cervical decompression sx in 06      CHL IP CERVICAL ESOPHAGEAL PHASE 07/27/2014  Cervical Esophageal Phase Impaired                                   Thin Straw Esophageal backflow into the pharynx;Other (Comment)     Cervical Esophageal Comment pt continues with poor clearance of cervical  esophagus with backflow of thin and puree without pt sensation, barium  mixed with secretions, per prior MBS in 2014 pt with narrowing of cervical  esophagus - suspect hardware impinging into cervical esophagus and  contributing to decreased clearance             Donavan Burnet, MS [facility] SLP [phone]    Dg Swallowing Func-speech Pathology  07/19/2014    Objective Swallowing Evaluation:    Patient Details  Name: Phillip May MRN: [id number] Date of Birth: 03-29-1945  Today's Date: 07/19/2014 Time: SLP Start Time (ACUTE ONLY): 1120-SLP Stop Time (ACUTE ONLY): 1155 SLP Time Calculation (min) (ACUTE ONLY): 35 min  Past Medical History:  Past Medical History  Diagnosis  Date  . Inguinal hernia   . COPD (chronic obstructive pulmonary disease)   . GERD (gastroesophageal reflux disease)   . Chronic hoarseness   . Tobacco abuse   . Cervical spinal stenosis     C4-C5; C5-C6; severe stenosis and abnormal  cord signal C6-C7 s/p  decompression in 04/2004   Past Surgical History:  Past Surgical History  Procedure Laterality Date  . Inguinal hernia repair    . Posterior laminectomy / decompression cervical spine  2006   HPI:  Other Pertinent Information: Cannon Arreola is a 69 y.o. male, history of  COPD (not on oxygen), ongoing smoking, occasional alcohol use, history of  dysphagia on a soft diet per MBS in September 2014, chronic hoarse voice,  GERD, generalized weakness and deconditioning.  PSH + for cervical  decompression surgery in 2006.  Pt reported chronic problems with  hoarseness and dysphagia s/p cervical surgery.  He also experienced 30  pound weight loss within 3 years-pt reported in 2014.  He lives at home  alone and receives help from neighbors for getting groceries and  activities of daily living.  The MBS in September 2014 did show trace  aspiration of thins and penetration of  nectar liquids.  CXR showed right  lower lobe pna.    No Data Recorded  Assessment / Plan / Recommendation CHL IP CLINICAL IMPRESSIONS 07/19/2014  Therapy Diagnosis Severe pharyngeal phase dysphagia;Severe cervical  esophageal phase dysphagia;Moderate pharyngeal phase dysphagia;Moderate  cervical esophageal phase dysphagia  Clinical Impression Known chronic dysphagia with suspected acute  exacerbation.  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    Weakness in pharyngeal contraction and tongue base retraction/poor  epiglottic deflection results in gross pharyngeal residuals that mix with  secretions without adequate pt sensation.  With liquids, pt conducts  multiple reflexive swallows (x4 - with liquids) with extended breathhold  *presumed  to be compensatory to help clear and prevent gross aspiration.   Poor hyolaryngeal elevation allows laryngeal penetration of liquids during  the swallow.  Liquid swallows helpful to reduce solid food residuals in  pharynx.    Mild amount of aspiration (silent) after the swallow noted as barium  spills posterior into open larynx from pyriform sinus.   Pt did not sense  residuals today (mixed with secretions) but cued swallow, liquid follow  solids and cued expectoration (of secretions mixed with barium) effective  to clear 80%.    Per previous MBS in 2014 *Dr Bradly Chris, radiologist stated findings  consistent with esophagram from 08/2012 indicating narrowing of lower  cervical esophagus and dilated cervical esophagus.  SLP today noted area  near cervical hardware where barium pools slightly, clears with reflexive  swallows.   Mr Hevener again reports desire to eat with known aspiration/malnutrition  risks.  Pt does admit to further weight loss of 5 pounds since 2014 and  states he has "good days and bad days" re: swallowing.  SLP  advised him  to monitor swallowing/nutrition closely to maximize  hydration/nutrition/airway safety.  Pt readily states liquid are easier  for him to consume.    To respect pt wishes, recommend dys3/thin with precautions.  Pt will  require crushed or liquid medications due to level of dysphagia.  Using  live video, educated pt to findings, effective compensations.  Also  recommend pt have suction set up to help with his secretion management.           No flowsheet data found.   CHL IP DIET RECOMMENDATION 07/19/2014  SLP Diet Recommendations Dysphagia 3 (Mech soft);Thin  Liquid Administration via cup  Medication Administration Crushed with puree  Compensations Slow rate;Small sips/bites;Follow solids with  liquid;Multiple dry swallows after each bite/sip, cough and expectorate  Postural Changes and/or Swallow Maneuvers (None)     CHL IP OTHER RECOMMENDATIONS 07/19/2014  Recommended Consults (None)   Oral Care Recommendations Oral care QID  Other Recommendations (None)     CHL IP FOLLOW UP RECOMMENDATIONS 10/28/2012  Follow up Recommendations None     CHL IP FREQUENCY AND DURATION 07/19/2014  Speech Therapy Frequency (ACUTE ONLY) min 2x/week  Treatment Duration 1 week         CHL IP REASON FOR REFERRAL 07/19/2014  Reason for Referral Objectively evaluate swallowing function     CHL IP ORAL PHASE 07/19/2014                    Oral Phase WFL                     CHL IP PHARYNGEAL PHASE 07/19/2014  Pharyngeal Phase Impaired  Pharyngeal Comment multiple swallows across consistencies, following solid  with liquids helpful to decrease  vallecular residuals, cough and  expectoration helpful, limited postural changes available due to cervical  decompression sx in 06      Donavan Burnet, MS [facility] SLP [phone]    Dg C-arm 1-60 Min  07/20/2014   CLINICAL DATA:  69 year old male with left intertrochanteric femoral fracture undergoing ORIF  EXAM: DG C-ARM 61-120 MIN; LEFT FEMUR 2 VIEWS  COMPARISON:  Preoperative radiographs 07/19/2014  FINDINGS: For intraoperative spot radiographs demonstrate open reduction internal fixation of intertrochanteric femoral fracture with an intra medullary femoral nail and transfemoral neck gamma nail. No evidence of immediate hardware complication. There is a single distal interlocking screw.  IMPRESSION: ORIF of left intertrochanteric femoral fracture without evidence of immediate complication   Electronically Signed   By: Malachy Moan M.D.   On: 07/20/2014 20:39   Dg Hip Unilat With Pelvis 2-3 Views Left  07/19/2014   CLINICAL DATA:  Fall today with left hip pain, initial encounter  EXAM: LEFT HIP (WITH PELVIS) 2-3 VIEWS  COMPARISON:  None.  FINDINGS: There is an a minimally displaced intratrochanteric fracture of the proximal left femur. No dislocation is noted. Contrast material is seen from prior speech pathology examination. The pelvic ring is otherwise intact. No other focal abnormality is  seen.  IMPRESSION: Mildly displaced left intratrochanteric femoral fracture.   Electronically Signed   By: Alcide Clever M.D.   On: 07/19/2014 17:34   Dg Femur Min 2 Views Left  07/20/2014   CLINICAL DATA:  Postop left hip fracture repair  EXAM: LEFT FEMUR 2 VIEWS  COMPARISON:  07/19/2014  FINDINGS: Internal fixation across the left intertrochanteric femoral fracture. Near anatomic alignment. No hardware or bony complicating feature.  IMPRESSION: Internal fixation across the left femoral intertrochanteric fracture. No complicating feature.   Electronically Signed   By: Charlett Nose M.D.   On: 07/20/2014 21:20   Dg Femur Min 2 Views Left  07/20/2014   CLINICAL DATA:  69 year old male with left intertrochanteric femoral fracture undergoing ORIF  EXAM: DG C-ARM 61-120 MIN; LEFT FEMUR 2 VIEWS  COMPARISON:  Preoperative radiographs 07/19/2014  FINDINGS: For intraoperative spot radiographs demonstrate open reduction internal fixation of intertrochanteric femoral fracture with an intra medullary femoral nail and transfemoral neck gamma nail. No evidence of immediate hardware complication. There is a single distal interlocking screw.  IMPRESSION: ORIF of left intertrochanteric femoral fracture without evidence of immediate complication   Electronically Signed   By: Malachy Moan M.D.   On: 07/20/2014 20:39     PERTINENT LAB RESULTS: CBC: No results for input(s): WBC, HGB, HCT, PLT in the last 72 hours. CMET CMP     Component Value Date/Time   NA 135 07/24/2014 0930   K 3.6 07/24/2014 0930   CL 96* 07/24/2014 0930   CO2 30 07/24/2014 0930   GLUCOSE 99 07/24/2014 0930   BUN 8 07/24/2014 0930   CREATININE 0.64 07/27/2014 0441   CALCIUM 8.2* 07/24/2014 0930   PROT 7.2 07/17/2014 1206   ALBUMIN 3.0* 07/17/2014 1206   AST 26 07/17/2014 1206   ALT 18 07/17/2014 1206   ALKPHOS 76 07/17/2014 1206   BILITOT 0.8 07/17/2014 1206   GFRNONAA >60 07/27/2014 0441   GFRAA >60 07/27/2014 0441     GFR Estimated Creatinine Clearance: 57.5 mL/min (by C-G formula based on Cr of 0.64). No results for input(s): LIPASE, AMYLASE in the last 72 hours. No results for input(s): CKTOTAL, CKMB, CKMBINDEX, TROPONINI in the last 72 hours. Invalid input(s): POCBNP No results for input(s):  DDIMER in the last 72 hours. No results for input(s): HGBA1C in the last 72 hours. No results for input(s): CHOL, HDL, LDLCALC, TRIG, CHOLHDL, LDLDIRECT in the last 72 hours. No results for input(s): TSH, T4TOTAL, T3FREE, THYROIDAB in the last 72 hours.  Invalid input(s): FREET3 No results for input(s): VITAMINB12, FOLATE, FERRITIN, TIBC, IRON, RETICCTPCT in the last 72 hours. Coags: No results for input(s): INR in the last 72 hours.  Invalid input(s): PT Microbiology: Recent Results (from the past 240 hour(s))  MRSA PCR Screening     Status: None   Collection Time: 07/22/14  4:22 PM  Result Value Ref Range Status   MRSA by PCR NEGATIVE NEGATIVE Final    Comment:        The GeneXpert MRSA Assay (FDA approved for NASAL specimens only), is one component of a comprehensive MRSA colonization surveillance program. It is not intended to diagnose MRSA infection nor to guide or monitor treatment for MRSA infections.      BRIEF HOSPITAL COURSE:  Brief narrative  69 year old male with history of COPD, EtOH use, tobacco abuse, chronic dysphagia, GERD who lives at home by himself with the help of neighbors was found on the floor on the day of admission and brought to the emergency room. Found to have acute on chronic respiratory failure likely secondary to presumed aspiration pneumonia, patient was subsequently admitted to the hospital. Hospital course has been complicated by development of delirium which resulted in a fall causing a left hip fracture. During this hospital stay, patient was seen by speech therapy and found to have significant dysphagia and was kept nothing by mouth. Unfortunately Dysphagia  has not improved, after discussion with patient, PEG tube placement was planned, because of the anatomy this was not able to be done by interventional radiology, general surgery planned PEG tube placement on 6/13-however patient/family were hesistant to and was rescheduled for 07/28/14.  Hospital course by problem list: Presumed aspiration pneumonia: Has completed a course of Unasyn- discontinued on 6/13. Remains afebrile and nontoxic looking.No further Abx needed on discharge  Active Problems: Acute on chronic respiratory failure: Secondary to above. Continue nothing by mouth-await placement of PEG tube. Both family (spoke to daughter and son 6/13) and patient aware that patient will continue to have aspiration risk with or without peg tube.  Left hip fracture: Occurred on 6/7 after sustaining a mechanical fall while delirious in the hospital. Orthopedics consulted, underwent ORIF on 07/20/2014. On Lovenox for DVT prophylaxis. Hospital course was complicated by development of perioperative blood loss anemia requiring 1 unit of PRBC on 6/10. Hemoglobin stable.Please recheck CBC in 1 week  Chronic dysphagia: See above-underwent SLP evaluation recommendations were NPO.Peg tube placement attempted by IR 07/22/2014-however unsuccessful due to overlying small bowel, consulted general surgery on 07/23/2014, was due for PEG tube placement 6/13-however patient and family were hesitant. I spoke with patient, son and daughter 6/13 after much discussion-family has decided to go ahead with peg tube placement. Subsequently underwent diagnostic laparoscopy and percutaneous PEG tube placement in the OR by Gen Surgery on 07/28/14.EGD during percutaneous peg placement did not show major abnormalities in the esophagus and stomach. Had a repeat SLP evaluation on 6/14, patient and family would like to continue with some minimal oral intake for comfort. Family and patient are aware of risks and accepting all risks-there with  that this could lead to life-threatening/disabling pneumonia,sepsis choking, respiratory failure and death.  Hypertension: Controlled Amlodipine and Bystolic  Sepsis: Secondary to pneumonia. Has resolved with  IV fluids and antibiotics.  History of COPD: stable with clear lungs. No current bronchodilators regimen.  Tobacco abuse: Counseled extensively.  Alcohol abuse with withdrawal: No signs of withdrawal currently. He remains on Ativan per protocol.  Anemia: Multifactorial-suspect has anemia of chronic disease at baseline, worsened by perioperative blood loss following left hip repair. Hemoglobin currently stable. Monitor CBC periodically while outpatinet  Severe Protein-calorie malnutrition: Continue supplements   TODAY-DAY OF DISCHARGE:  Subjective:   [place] today has no headache,no chest abdominal pain,no new weakness tingling or numbness, feels much better wants to go home today.   Objective:   Blood pressure 136/74, pulse 67, temperature 98.4 F (36.9 C), temperature source Oral, resp. rate 20, height 5\' 7"  (1.702 m), weight 46 kg (101 lb 6.6 oz), SpO2 100 %.  Intake/Output Summary (Last 24 hours) at 07/30/14 0828 Last data filed at 07/30/14 0600  Gross per 24 hour  Intake 2759.67 ml  Output    700 ml  Net 2059.67 ml   Filed Weights   07/17/14 1500 07/23/14 1312 07/30/14 0357  Weight: 44.3 kg (97 lb 10.6 oz) 47.7 kg (105 lb 2.6 oz) 46 kg (101 lb 6.6 oz)    Exam Awake Alert, Oriented *3, No new F.N deficits, Normal affect [place].AT,PERRAL Supple Neck,No JVD, No cervical lymphadenopathy appriciated.  Symmetrical Chest wall movement, Good air movement bilaterally, CTAB RRR,No Gallops,Rubs or new Murmurs, No Parasternal Heave +ve B.Sounds, Abd Soft, Non tender, No organomegaly appriciated, No rebound -guarding or rigidity. No Cyanosis, Clubbing or edema, No new Rash or bruise  DISCHARGE CONDITION: Stable  DISPOSITION: SNF  DISCHARGE INSTRUCTIONS:     Activity:  WBAT with walker-Keep incisions clean and dry. Change dry dressings as needed  Diet recommendation: Dysphagia 3 diet for COMFORT ONLY-on peg feeds as well. Aspiration precautions:yes  Discharge Instructions    Call MD for:  redness, tenderness, or signs of infection (pain, swelling, redness, odor or green/yellow discharge around incision site)    Complete by:  As directed      Diet - low sodium heart healthy    Complete by:  As directed   Dysphagia 3 diet with thin liquids for comfort only. Strict Aspiration precautions     Increase activity slowly    Complete by:  As directed            Follow-up Information    Follow up with Swinteck, Cloyde Reams, MD. Schedule an appointment as soon as possible for a visit in 2 weeks.   Specialty:  Orthopedic Surgery   Why:  For wound re-check   Contact information:   [address] [place] [address] [phone]       Follow up with Londell Moh, MD. Schedule an appointment as soon as possible for a visit in 2 weeks.   Specialty:  Internal Medicine   Contact information:   [address] [place] [address] [phone]       Schedule an appointment as soon as possible for a visit with [facility] H, MD.   Specialty:  General Surgery   Why:  As needed, For suture removal, For wound re-check   Contact information:   [address] [place] [address] [phone]      Total Time spent on discharge equals 45 minutes.  SignedJeoffrey Massed 07/30/2014 8:28 AM

## 2014-07-30 NOTE — Progress Notes (Signed)
Pt for discharge to [facility].  CSW facilitated pt discharge needs including contacting facility, faxing pt discharge information via [facility], discussing with pt at bedside and pt daughter, Renda Rolls via telephone, providing RN phone number to call report, and scheduled ambulance transport for pt for pick up for 1 pm.   Pt appreciative of CSW notifying pt daughter who lives in [place].   No further social work needs identified at this time.  CSW signing off.   Loletta Specter, MSW, LCSW Clinical Social Work [phone]

## 2014-07-30 NOTE — Progress Notes (Signed)
[facility] Progress Note  2 Days Post-Op  Subjective: Pt still c/o some pain.  No N/V, tolerating TF at 6mL/hr.  Wants coffee.    Objective: Vital signs in last 24 hours: Temp:  [98.4 F (36.9 C)-98.8 F (37.1 C)] 98.4 F (36.9 C) (06/17 0357) Pulse Rate:  [67-82] 67 (06/17 0357) Resp:  [20] 20 (06/17 0357) BP: (136-153)/(74-87) 136/74 mmHg (06/17 0357) SpO2:  [84 %-100 %] 84 % (06/17 0913) Weight:  [46 kg (101 lb 6.6 oz)] 46 kg (101 lb 6.6 oz) (06/17 0357) Last BM Date: 07/28/14  Intake/Output from previous day: 06/16 0701 - 06/17 0700 In: 2759.7 [P.O.:60; I.V.:2300; NG/GT:169.7] Out: 700 [Urine:700] Intake/Output this shift: Total I/O In: 30 [Other:30] Out: -   PE: Gen:  Alert, NAD, pleasant Abd: Soft, mild tenderness, ND, +BS, no HSM, PEG in LUQ   Lab Results:  No results for input(s): WBC, HGB, HCT, PLT in the last 72 hours. BMET No results for input(s): NA, K, CL, CO2, GLUCOSE, BUN, CREATININE, CALCIUM in the last 72 hours. PT/INR No results for input(s): LABPROT, INR in the last 72 hours. CMP     Component Value Date/Time   NA 135 07/24/2014 0930   K 3.6 07/24/2014 0930   CL 96* 07/24/2014 0930   CO2 30 07/24/2014 0930   GLUCOSE 99 07/24/2014 0930   BUN 8 07/24/2014 0930   CREATININE 0.64 07/27/2014 0441   CALCIUM 8.2* 07/24/2014 0930   PROT 7.2 07/17/2014 1206   ALBUMIN 3.0* 07/17/2014 1206   AST 26 07/17/2014 1206   ALT 18 07/17/2014 1206   ALKPHOS 76 07/17/2014 1206   BILITOT 0.8 07/17/2014 1206   GFRNONAA >60 07/27/2014 0441   GFRAA >60 07/27/2014 0441   Lipase  No results found for: LIPASE     Studies/Results: No results found.  Anti-infectives: Anti-infectives    Start     Dose/Rate Route Frequency Ordered Stop   07/28/14 0809  [MAR Hold]  ceFAZolin (ANCEF) IVPB 2 g/50 mL premix     (MAR Hold since 07/28/14 0810)   2 g 100 mL/hr over 30 Minutes Intravenous 30 min pre-op 07/28/14 0735 07/28/14 0923   07/26/14 0000   metroNIDAZOLE (FLAGYL) IVPB 500 mg  Status:  Discontinued    Comments:  Pharmacy may adjust dosing strength, interval, or rate of medication as needed for optimal therapy for the patient Send with patient on call to the OR.  Anesthesia to complete antibiotic administration <43min prior to incision per [facility].   500 mg 100 mL/hr over 60 Minutes Intravenous 30 min pre-op 07/25/14 1043 07/28/14 0735   07/21/14 0000  ceFAZolin (ANCEF) IVPB 2 g/50 mL premix     2 g 100 mL/hr over 30 Minutes Intravenous Every 6 hours 07/20/14 2139 07/21/14 0646   07/20/14 1000  ceFAZolin (ANCEF) IVPB 2 g/50 mL premix     2 g 100 mL/hr over 30 Minutes Intravenous  Once 07/19/14 2133 07/20/14 1833   07/19/14 1000  ampicillin-sulbactam (UNASYN) 1.5 g in sodium chloride 0.9 % 50 mL IVPB  Status:  Discontinued     1.5 g 100 mL/hr over 30 Minutes Intravenous Every 6 hours 07/19/14 0812 07/26/14 1027   07/18/14 1400  azithromycin (ZITHROMAX) 500 mg in dextrose 5 % 250 mL IVPB  Status:  Discontinued     500 mg 250 mL/hr over 60 Minutes Intravenous Every 24 hours 07/17/14 1612 07/23/14 0841   07/18/14 0200  ampicillin-sulbactam (UNASYN) 1.5 g in sodium chloride  0.9 % 50 mL IVPB  Status:  Discontinued     1.5 g 100 mL/hr over 30 Minutes Intravenous Every 8 hours 07/17/14 1621 07/19/14 0812   07/18/14 0000  azithromycin (ZITHROMAX) 500 mg in dextrose 5 % 250 mL IVPB  Status:  Discontinued     500 mg 250 mL/hr over 60 Minutes Intravenous Every 24 hours 07/17/14 1344 07/17/14 1612   07/17/14 1500  Ampicillin-Sulbactam (UNASYN) 3 g in sodium chloride 0.9 % 100 mL IVPB     3 g 100 mL/hr over 60 Minutes Intravenous  Once 07/17/14 1432 07/17/14 1900   07/17/14 1230  cefTRIAXone (ROCEPHIN) 1 g in dextrose 5 % 50 mL IVPB  Status:  Discontinued     1 g 100 mL/hr over 30 Minutes Intravenous  Once 07/17/14 1219 07/17/14 1416   07/17/14 1230  azithromycin (ZITHROMAX) 500 mg in dextrose 5 % 250 mL IVPB     500 mg 250 mL/hr  over 60 Minutes Intravenous  Once 07/17/14 1219 07/17/14 1550       Assessment/Plan POD #2, s/p lap assisted PEG placement - 07/28/2014 - D. Ezzard Standing -may advance to TF goal today -diet for comfort -may give meds down g-tube but they need to be crushed and flushed.  -Sutures around PEG can be cut between 3-4 WEEKS from now (SNF can remove these) -no surgical follow up needed unless issues arise    LOS: 13 days    Nonie Hoyer 07/30/2014, 10:07 AM Pager: [phone]  Agree with above. He has done better than expected, though his long term prognosis is poor. His son called me on the phone and I reviewed things with him.  Ovidio Kin, MD, [facility] Pager: [phone] Office phone:  [phone]

## 2014-08-07 ENCOUNTER — Encounter (HOSPITAL_COMMUNITY): Payer: Self-pay | Admitting: Emergency Medicine

## 2014-08-07 ENCOUNTER — Emergency Department (HOSPITAL_COMMUNITY): Payer: Medicare Other

## 2014-08-07 ENCOUNTER — Inpatient Hospital Stay (HOSPITAL_COMMUNITY)
Admission: EM | Admit: 2014-08-07 | Discharge: 2014-08-20 | DRG: 981 | Disposition: A | Discharge status: Hospice | Payer: Medicare Other | Attending: Internal Medicine | Admitting: Internal Medicine

## 2014-08-07 DIAGNOSIS — D638 Anemia in other chronic diseases classified elsewhere: Secondary | ICD-10-CM | POA: Diagnosis present

## 2014-08-07 DIAGNOSIS — I959 Hypotension, unspecified: Secondary | ICD-10-CM | POA: Diagnosis not present

## 2014-08-07 DIAGNOSIS — J9811 Atelectasis: Secondary | ICD-10-CM | POA: Diagnosis not present

## 2014-08-07 DIAGNOSIS — R64 Cachexia: Secondary | ICD-10-CM | POA: Diagnosis present

## 2014-08-07 DIAGNOSIS — Z515 Encounter for palliative care: Secondary | ICD-10-CM

## 2014-08-07 DIAGNOSIS — R0902 Hypoxemia: Secondary | ICD-10-CM | POA: Diagnosis present

## 2014-08-07 DIAGNOSIS — E872 Acidosis: Secondary | ICD-10-CM | POA: Diagnosis present

## 2014-08-07 DIAGNOSIS — J9622 Acute and chronic respiratory failure with hypercapnia: Secondary | ICD-10-CM | POA: Diagnosis not present

## 2014-08-07 DIAGNOSIS — J9602 Acute respiratory failure with hypercapnia: Secondary | ICD-10-CM | POA: Diagnosis not present

## 2014-08-07 DIAGNOSIS — T4275XA Adverse effect of unspecified antiepileptic and sedative-hypnotic drugs, initial encounter: Secondary | ICD-10-CM | POA: Diagnosis present

## 2014-08-07 DIAGNOSIS — E43 Unspecified severe protein-calorie malnutrition: Secondary | ICD-10-CM | POA: Diagnosis present

## 2014-08-07 DIAGNOSIS — L89152 Pressure ulcer of sacral region, stage 2: Secondary | ICD-10-CM | POA: Diagnosis present

## 2014-08-07 DIAGNOSIS — I10 Essential (primary) hypertension: Secondary | ICD-10-CM | POA: Diagnosis present

## 2014-08-07 DIAGNOSIS — J690 Pneumonitis due to inhalation of food and vomit: Secondary | ICD-10-CM | POA: Diagnosis present

## 2014-08-07 DIAGNOSIS — R0600 Dyspnea, unspecified: Secondary | ICD-10-CM | POA: Diagnosis not present

## 2014-08-07 DIAGNOSIS — Z931 Gastrostomy status: Secondary | ICD-10-CM

## 2014-08-07 DIAGNOSIS — Z978 Presence of other specified devices: Secondary | ICD-10-CM | POA: Insufficient documentation

## 2014-08-07 DIAGNOSIS — R490 Dysphonia: Secondary | ICD-10-CM | POA: Diagnosis present

## 2014-08-07 DIAGNOSIS — R739 Hyperglycemia, unspecified: Secondary | ICD-10-CM | POA: Diagnosis not present

## 2014-08-07 DIAGNOSIS — J189 Pneumonia, unspecified organism: Secondary | ICD-10-CM | POA: Insufficient documentation

## 2014-08-07 DIAGNOSIS — Z66 Do not resuscitate: Secondary | ICD-10-CM | POA: Diagnosis present

## 2014-08-07 DIAGNOSIS — R1319 Other dysphagia: Secondary | ICD-10-CM | POA: Diagnosis present

## 2014-08-07 DIAGNOSIS — J9690 Respiratory failure, unspecified, unspecified whether with hypoxia or hypercapnia: Secondary | ICD-10-CM

## 2014-08-07 DIAGNOSIS — G9340 Encephalopathy, unspecified: Secondary | ICD-10-CM | POA: Diagnosis present

## 2014-08-07 DIAGNOSIS — N179 Acute kidney failure, unspecified: Secondary | ICD-10-CM | POA: Diagnosis not present

## 2014-08-07 DIAGNOSIS — K219 Gastro-esophageal reflux disease without esophagitis: Secondary | ICD-10-CM | POA: Diagnosis present

## 2014-08-07 DIAGNOSIS — Z681 Body mass index (BMI) 19 or less, adult: Secondary | ICD-10-CM

## 2014-08-07 DIAGNOSIS — J9621 Acute and chronic respiratory failure with hypoxia: Secondary | ICD-10-CM | POA: Diagnosis not present

## 2014-08-07 DIAGNOSIS — R627 Adult failure to thrive: Secondary | ICD-10-CM | POA: Diagnosis present

## 2014-08-07 DIAGNOSIS — R54 Age-related physical debility: Secondary | ICD-10-CM | POA: Diagnosis present

## 2014-08-07 DIAGNOSIS — Z23 Encounter for immunization: Secondary | ICD-10-CM | POA: Diagnosis not present

## 2014-08-07 DIAGNOSIS — E46 Unspecified protein-calorie malnutrition: Secondary | ICD-10-CM | POA: Diagnosis not present

## 2014-08-07 DIAGNOSIS — E861 Hypovolemia: Secondary | ICD-10-CM | POA: Diagnosis present

## 2014-08-07 DIAGNOSIS — E871 Hypo-osmolality and hyponatremia: Secondary | ICD-10-CM | POA: Diagnosis present

## 2014-08-07 DIAGNOSIS — I429 Cardiomyopathy, unspecified: Secondary | ICD-10-CM | POA: Diagnosis not present

## 2014-08-07 DIAGNOSIS — L8990 Pressure ulcer of unspecified site, unspecified stage: Secondary | ICD-10-CM | POA: Insufficient documentation

## 2014-08-07 DIAGNOSIS — F17210 Nicotine dependence, cigarettes, uncomplicated: Secondary | ICD-10-CM | POA: Diagnosis present

## 2014-08-07 DIAGNOSIS — R1310 Dysphagia, unspecified: Secondary | ICD-10-CM | POA: Diagnosis present

## 2014-08-07 DIAGNOSIS — Z789 Other specified health status: Secondary | ICD-10-CM | POA: Diagnosis not present

## 2014-08-07 HISTORY — DX: Acute respiratory failure, unspecified whether with hypoxia or hypercapnia: J96.00

## 2014-08-07 HISTORY — DX: Essential (primary) hypertension: I10

## 2014-08-07 LAB — POCT I-STAT 3, ART BLOOD GAS (G3+)
ACID-BASE DEFICIT: 1 mmol/L (ref 0.0–2.0)
BICARBONATE: 29.9 meq/L — AB (ref 20.0–24.0)
O2 Saturation: 91 %
PCO2 ART: 80.2 mmHg — AB (ref 35.0–45.0)
Patient temperature: 97.1
TCO2: 32 mmol/L (ref 0–100)
pH, Arterial: 7.175 — CL (ref 7.350–7.450)
pO2, Arterial: 76 mmHg — ABNORMAL LOW (ref 80.0–100.0)

## 2014-08-07 LAB — CBC WITH DIFFERENTIAL/PLATELET
Basophils Absolute: 0 10*3/uL (ref 0.0–0.1)
Basophils Relative: 0 % (ref 0–1)
Eosinophils Absolute: 0.1 10*3/uL (ref 0.0–0.7)
Eosinophils Relative: 1 % (ref 0–5)
HEMATOCRIT: 26.1 % — AB (ref 39.0–52.0)
Hemoglobin: 9 g/dL — ABNORMAL LOW (ref 13.0–17.0)
Lymphocytes Relative: 9 % — ABNORMAL LOW (ref 12–46)
Lymphs Abs: 1.3 10*3/uL (ref 0.7–4.0)
MCH: 32.1 pg (ref 26.0–34.0)
MCHC: 34.5 g/dL (ref 30.0–36.0)
MCV: 93.2 fL (ref 78.0–100.0)
Monocytes Absolute: 0.5 10*3/uL (ref 0.1–1.0)
Monocytes Relative: 3 % (ref 3–12)
NEUTROS ABS: 13.8 10*3/uL — AB (ref 1.7–7.7)
Neutrophils Relative %: 87 % — ABNORMAL HIGH (ref 43–77)
Platelets: 358 10*3/uL (ref 150–400)
RBC: 2.8 MIL/uL — ABNORMAL LOW (ref 4.22–5.81)
RDW: 13.2 % (ref 11.5–15.5)
WBC: 15.8 10*3/uL — ABNORMAL HIGH (ref 4.0–10.5)

## 2014-08-07 LAB — I-STAT ARTERIAL BLOOD GAS, ED
Acid-Base Excess: 2 mmol/L (ref 0.0–2.0)
Bicarbonate: 30.7 mEq/L — ABNORMAL HIGH (ref 20.0–24.0)
O2 Saturation: 94 %
PO2 ART: 85 mmHg (ref 80.0–100.0)
Patient temperature: 98.7
TCO2: 33 mmol/L (ref 0–100)
pCO2 arterial: 72.9 mmHg (ref 35.0–45.0)
pH, Arterial: 7.232 — ABNORMAL LOW (ref 7.350–7.450)

## 2014-08-07 LAB — COMPREHENSIVE METABOLIC PANEL
ALBUMIN: 2.4 g/dL — AB (ref 3.5–5.0)
ALT: 35 U/L (ref 17–63)
AST: 54 U/L — AB (ref 15–41)
Alkaline Phosphatase: 93 U/L (ref 38–126)
Anion gap: 9 (ref 5–15)
BUN: 15 mg/dL (ref 6–20)
CHLORIDE: 92 mmol/L — AB (ref 101–111)
CO2: 27 mmol/L (ref 22–32)
CREATININE: 0.72 mg/dL (ref 0.61–1.24)
Calcium: 8.5 mg/dL — ABNORMAL LOW (ref 8.9–10.3)
GFR calc Af Amer: >60 mL/min (ref >60)
GFR calc non Af Amer: >60 mL/min (ref >60)
Glucose, Bld: 231 mg/dL — ABNORMAL HIGH (ref 65–99)
Potassium: 4.2 mmol/L (ref 3.5–5.1)
SODIUM: 128 mmol/L — AB (ref 135–145)
Total Bilirubin: 0.4 mg/dL (ref 0.3–1.2)
Total Protein: 6.7 g/dL (ref 6.5–8.1)

## 2014-08-07 LAB — BRAIN NATRIURETIC PEPTIDE: B NATRIURETIC PEPTIDE 5: 676.8 pg/mL — AB (ref 0.0–100.0)

## 2014-08-07 LAB — I-STAT CG4 LACTIC ACID, ED: LACTIC ACID, VENOUS: 1.6 mmol/L (ref 0.5–2.0)

## 2014-08-07 LAB — I-STAT TROPONIN, ED: Troponin i, poc: 0 ng/mL (ref 0.00–0.08)

## 2014-08-07 MED ORDER — LORAZEPAM 2 MG/ML IJ SOLN
0.5000 mg | Freq: Once | INTRAMUSCULAR | Status: AC
  Administered 2014-08-07: 0.5 mg via INTRAVENOUS
  Filled 2014-08-07: qty 1

## 2014-08-07 MED ORDER — PIPERACILLIN-TAZOBACTAM 3.375 G IVPB 30 MIN
3.3750 g | Freq: Once | INTRAVENOUS | Status: AC
  Administered 2014-08-07: 3.375 g via INTRAVENOUS
  Filled 2014-08-07: qty 50

## 2014-08-07 NOTE — ED Provider Notes (Signed)
CSN: [id number]     Arrival date & time 08/07/14  1953 History   First MD Initiated Contact with Patient 08/07/14 2007     Chief Complaint  Patient presents with  . Respiratory Distress     (Consider location/radiation/quality/duration/timing/severity/associated sxs/prior Treatment) Patient is a 68 y.o. male presenting with shortness of breath.  Shortness of Breath Severity:  Severe Onset quality:  Gradual Duration:  3 hours Timing:  Constant Progression:  Worsening Chronicity:  Recurrent Context comment:  Patient with a history of COPD and dysphagia with recurrent aspiration pneumonia who was recently admitted earlier this month for aspiration pneumonia. Patient is allowed to eat with assistance for comfort however he was seen earlier eating by himse Relieved by:  Nothing Worsened by:  Nothing tried Ineffective treatments:  Inhaler and oxygen Associated symptoms: no abdominal pain, no chest pain, no claudication, no cough, no diaphoresis, no ear pain, no fever, no headaches, no PND, no rash, no sore throat, no sputum production, no vomiting and no wheezing    On EMS arrival the patient was satting in the 60s. He was placed on CPAP and has saturation improvements to the mid 80s. The patient was agitated throughout the ride to the hospital.  Past Medical History  Diagnosis Date  . Inguinal hernia   . COPD (chronic obstructive pulmonary disease)   . GERD (gastroesophageal reflux disease)   . Chronic hoarseness   . Tobacco abuse   . Cervical spinal stenosis     C4-C5; C5-C6; severe stenosis and abnormal  cord signal C6-C7 s/p decompression in 04/2004   Past Surgical History  Procedure Laterality Date  . Inguinal hernia repair    . Posterior laminectomy / decompression cervical spine  2006  . Intramedullary (im) nail intertrochanteric Left 07/20/2014    Procedure: INTRAMEDULLARY (IM) NAIL INTERTROCHANTRIC LEFT HIP;  Surgeon: [name], MD;  Location: [facility];  Service:  Orthopedics;  Laterality: Left;  . Laparoscopy N/A 07/28/2014    Procedure: LAPAROSCOPY DIAGNOSTIC;  Surgeon: Ovidio Kin, MD;  Location: [facility];  Service: General;  Laterality: N/A;  . Peg placement  07/28/2014    Procedure: PERCUTANEOUS ENDOSCOPIC GASTROSTOMY (PEG) PLACEMENT;  Surgeon: Ovidio Kin, MD;  Location: [facility];  Service: General;;   Family History  Problem Relation Age of Onset  . Emphysema Father   . Stomach cancer Sister    History  Substance Use Topics  . Smoking status: Current Every Day Smoker -- 0.20 packs/day for 30 years    Types: Cigarettes  . Smokeless tobacco: Never Used     Comment: 3-4 cigs a day  . Alcohol Use: 7.0 oz/week    14 Standard drinks or equivalent per week     Comment: 3-4 shots of liquor a day    Review of Systems  Constitutional: Negative for fever, chills, diaphoresis, appetite change and fatigue.  HENT: Negative for congestion, ear pain, facial swelling, mouth sores and sore throat.   Eyes: Negative for visual disturbance.  Respiratory: Positive for chest tightness and shortness of breath. Negative for cough, sputum production and wheezing.   Cardiovascular: Negative for chest pain, palpitations, claudication and PND.  Gastrointestinal: Negative for nausea, vomiting, abdominal pain, diarrhea and blood in stool.  Endocrine: Negative for cold intolerance and heat intolerance.  Genitourinary: Negative for frequency, decreased urine volume and difficulty urinating.  Musculoskeletal: Negative for back pain and neck stiffness.  Skin: Negative for rash.  Neurological: Negative for dizziness, weakness, light-headedness and headaches.  All other systems reviewed and  are negative.     Allergies  Review of patient's allergies indicates no known allergies.  Home Medications   Prior to Admission medications   Medication Sig Start Date End Date Taking? Authorizing Provider  budesonide-formoterol (SYMBICORT) 160-4.5 MCG/ACT inhaler Inhale 2  puffs into the lungs 2 (two) times daily.   Yes Historical Provider, MD  enoxaparin (LOVENOX) 30 MG/0.3ML injection Inject 0.3 mLs (30 mg total) into the skin daily. FOR 30 DAYS FROM 07/20/14 and then stop 07/30/14  Yes Shanker Levora Dredge, MD  folic acid (FOLVITE) 1 MG tablet Take 1 tablet (1 mg total) by mouth daily. 07/30/14  Yes Shanker Levora Dredge, MD  guaiFENesin (MUCINEX) 600 MG 12 hr tablet Take 600 mg by mouth 2 (two) times daily.    Yes Historical Provider, MD  HYDROcodone-acetaminophen (NORCO/VICODIN) 5-325 MG per tablet Take 1-2 tablets by mouth every 6 (six) hours as needed for moderate pain. 07/30/14  Yes Shanker Levora Dredge, MD  levalbuterol (XOPENEX) 1.25 MG/0.5ML nebulizer solution Take 1.25 mg by nebulization 3 (three) times daily. 07/30/14  Yes Shanker Levora Dredge, MD  Multiple Vitamin (MULTIVITAMIN WITH MINERALS) TABS tablet Take 1 tablet by mouth daily. 07/30/14  Yes Shanker Levora Dredge, MD  nebivolol (BYSTOLIC) 5 MG tablet Take 1 tablet (5 mg total) by mouth daily. 07/30/14  Yes Shanker Levora Dredge, MD  Nutritional Supplements (FEEDING SUPPLEMENT, OSMOLITE 1.2 CAL,) LIQD Place 1,000 mLs into feeding tube continuous. Goal of 40 cc/hr Patient taking differently: Place 60 mL/hr into feeding tube continuous.  07/30/14  Yes Shanker Levora Dredge, MD  thiamine 100 MG tablet Take 1 tablet (100 mg total) by mouth daily. 07/30/14  Yes Shanker Levora Dredge, MD  tiotropium (SPIRIVA HANDIHALER) 18 MCG inhalation capsule Place 1 capsule (18 mcg total) into inhaler and inhale daily. 07/30/14 07/30/15 Yes Shanker Levora Dredge, MD  Water For Irrigation, Sterile (FREE WATER) SOLN Place 150 mLs into feeding tube every 6 (six) hours.   Yes Historical Provider, MD   BP 135/86 mmHg  Pulse 76  Temp(Src) 97.1 F (36.2 C) (Axillary)  Resp 32  Ht  (1.702 m)  Wt 101 lb 3.2 oz (45.904 kg)  BMI 15.85 kg/m2  SpO2 98% Physical Exam  Constitutional: He is oriented to person, place, and time. He appears well-nourished. He appears  cachectic. No distress.  HENT:  Head: Normocephalic and atraumatic.  Right Ear: External ear normal.  Left Ear: External ear normal.  Eyes: Pupils are equal, round, and reactive to light. Right eye exhibits no discharge. Left eye exhibits no discharge. No scleral icterus.  Neck: Normal range of motion. Neck supple.  Cardiovascular: Normal rate.  Exam reveals no gallop and no friction rub.   No murmur heard. Pulmonary/Chest: No stridor. Tachypnea noted. He is in respiratory distress. He has decreased breath sounds. He has no wheezes. He has no rales. He exhibits no tenderness.  On CPAP   Abdominal: Soft. He exhibits no distension and no mass. There is no tenderness. There is no rebound and no guarding.  Musculoskeletal: He exhibits no edema or tenderness.  Neurological: He is alert and oriented to person, place, and time.  Skin: Skin is warm and dry. No rash noted. He is not diaphoretic. No erythema.    ED Course  Procedures (including critical care time) Labs Review Labs Reviewed  CBC WITH DIFFERENTIAL/PLATELET - Abnormal; Notable for the following:    WBC 15.8 (*)    RBC 2.80 (*)    Hemoglobin 9.0 (*)  HCT 26.1 (*)    Neutrophils Relative % 87 (*)    Neutro Abs 13.8 (*)    Lymphocytes Relative 9 (*)    All other components within normal limits  COMPREHENSIVE METABOLIC PANEL - Abnormal; Notable for the following:    Sodium 128 (*)    Chloride 92 (*)    Glucose, Bld 231 (*)    Calcium 8.5 (*)    Albumin 2.4 (*)    AST 54 (*)    All other components within normal limits  BRAIN NATRIURETIC PEPTIDE - Abnormal; Notable for the following:    B Natriuretic Peptide 676.8 (*)    All other components within normal limits  I-STAT ARTERIAL BLOOD GAS, ED - Abnormal; Notable for the following:    pH, Arterial 7.232 (*)    pCO2 arterial 72.9 (*)    Bicarbonate 30.7 (*)    All other components within normal limits  POCT I-STAT 3, ART BLOOD GAS (G3+) - Abnormal; Notable for the  following:    pH, Arterial 7.175 (*)    pCO2 arterial 80.2 (*)    pO2, Arterial 76.0 (*)    Bicarbonate 29.9 (*)    All other components within normal limits  CULTURE, BLOOD (ROUTINE X 2)  I-STAT CG4 LACTIC ACID, ED    Imaging Review Dg Chest Portable 1 View  08/07/2014   CLINICAL DATA:  Shortness of breath  EXAM: PORTABLE CHEST - 1 VIEW  COMPARISON:  07/22/2014  FINDINGS: Mild residual scarring/ opacity in the right middle/lower lobe, improved. No pleural effusion or pneumothorax.  The heart is normal in size.  IMPRESSION: Mild residual scarring/ opacity in the right middle/lower lobe, improved.   Electronically Signed   By: Charline Bills M.D.   On: 08/07/2014 20:34     EKG Interpretation None      MDM   69 year old male with a history of COPD, dysphagia, recurrent aspiration pneumonia who presents in respiratory distress after being found eating by himself. Rest of the history and exam as above. Patient transitioned to BiPAP on arrival and had improved work of breathing and saturations. The patient became more calm. Gas revealed a compensated respiratory acidosis. Chest x-ray with right lower lobe infiltrate concerning for aspiration. CBC with leukocytosis. Patient was covered for aspiration pneumonia. He'll remain hemodynamically stable and was admitted to stepdown unit for continued management.  He was seen in conjunction with Dr. Micheline Maze.  Final diagnoses:  Acute respiratory failure with hypercapnia  Aspiration pneumonia, unspecified aspiration pneumonia type      Drema Pry, MD 08/08/14 1610  Toy Cookey, MD 08/08/14 9604  Toy Cookey, MD 08/08/14 1211

## 2014-08-07 NOTE — ED Notes (Signed)
Patient from [facility] with acute onset of respiratory distress, history of Aspirations.  Patient was placed on CPAP by [facility] after finding him with O2 sats in the 60's.  Highest that EMS found was 85%.  MOST form with patient by not restrictive.

## 2014-08-07 NOTE — H&P (Signed)
History and Physical  Phillip May BJY:[id number] DOB: 1945/06/09 DOA: 08/07/2014  Referring physician: Dr Eudelia Bunch, ED physician PCP: Georgann Housekeeper, MD   Chief Complaint: Respiratory Failure  HPI: Phillip May is a 69 y.o. male  With a history of COPD, dysphasia, GERD, hypertension. The patient was hospitalized on 07/17/2014 after a fall and a history of community-acquired versus aspiration pneumonia. During his hospital stay, patient was found to have significant dysphagia. Surgery was consulted and a gastrostomy tube was placed. The patient has been obtaining feeds through the gastrostomy tube. Patient was discharged to [facility] nursing facility for rehabilitation as the patient had a intertrochanteric hip fracture that was repaired by orthopedic during the same hospitalization. Today the patient was eating dinner and had a witnessed aspiration event. The patient had sudden onset of respiratory failure due to the aspiration. EMS was called and was placed on CPAP due to oxygenation in the 60s.  When the patient arrived to the emergency department the patient was transitioned to BiPAP with improvement of his respiratory status with the patient currently satting approximately 90%. No provoking factors, but breathing improved on C Pap and BiPAP.  Review of Systems:   Pt denies any fevers, chills, nausea, vomiting, abdominal pain, chest pain, diarrhea, constipation.  Review of systems are otherwise negative  Past Medical History  Diagnosis Date  . Inguinal hernia   . COPD (chronic obstructive pulmonary disease)   . GERD (gastroesophageal reflux disease)   . Chronic hoarseness   . Tobacco abuse   . Cervical spinal stenosis     C4-C5; C5-C6; severe stenosis and abnormal  cord signal C6-C7 s/p decompression in 04/2004   Past Surgical History  Procedure Laterality Date  . Inguinal hernia repair    . Posterior laminectomy / decompression cervical spine  2006  . Intramedullary (im) nail  intertrochanteric Left 07/20/2014    Procedure: INTRAMEDULLARY (IM) NAIL INTERTROCHANTRIC LEFT HIP;  Surgeon: [name], MD;  Location: [facility];  Service: Orthopedics;  Laterality: Left;  . Laparoscopy N/A 07/28/2014    Procedure: LAPAROSCOPY DIAGNOSTIC;  Surgeon: Ovidio Kin, MD;  Location: [facility];  Service: General;  Laterality: N/A;  . Peg placement  07/28/2014    Procedure: PERCUTANEOUS ENDOSCOPIC GASTROSTOMY (PEG) PLACEMENT;  Surgeon: Ovidio Kin, MD;  Location: [facility];  Service: General;;   Social History:  reports that he has been smoking Cigarettes.  He has a 6 pack-year smoking history. He has never used smokeless tobacco. He reports that he drinks about 7.0 oz of alcohol per week. He reports that he does not use illicit drugs. Patient currently at [facility]  No Known Allergies  Family History  Problem Relation Age of Onset  . Emphysema Father   . Stomach cancer Sister      Prior to Admission medications   Medication Sig Start Date End Date Taking? Authorizing Provider  budesonide-formoterol (SYMBICORT) 160-4.5 MCG/ACT inhaler Inhale 2 puffs into the lungs 2 (two) times daily.    Historical Provider, MD  enoxaparin (LOVENOX) 30 MG/0.3ML injection Inject 0.3 mLs (30 mg total) into the skin daily. FOR 30 DAYS FROM 07/20/14 and then stop 07/30/14   Maretta Bees, MD  folic acid (FOLVITE) 1 MG tablet Take 1 tablet (1 mg total) by mouth daily. 07/30/14   Shanker Levora Dredge, MD  guaiFENesin (MUCINEX) 600 MG 12 hr tablet Take 600 mg by mouth daily as needed for cough or to loosen phlegm.    Historical Provider, MD  HYDROcodone-acetaminophen (NORCO/VICODIN) 5-325  MG per tablet Take 1-2 tablets by mouth every 6 (six) hours as needed for moderate pain. 07/30/14   Shanker Levora Dredge, MD  levalbuterol (XOPENEX) 1.25 MG/0.5ML nebulizer solution Take 1.25 mg by nebulization 3 (three) times daily. 07/30/14   Shanker Levora Dredge, MD  Multiple Vitamin (MULTIVITAMIN WITH MINERALS)  TABS tablet Take 1 tablet by mouth daily. 07/30/14   Shanker Levora Dredge, MD  nebivolol (BYSTOLIC) 5 MG tablet Take 1 tablet (5 mg total) by mouth daily. 07/30/14   Shanker Levora Dredge, MD  Nutritional Supplements (FEEDING SUPPLEMENT, OSMOLITE 1.2 CAL,) LIQD Place 1,000 mLs into feeding tube continuous. Goal of 40 cc/hr 07/30/14   Maretta Bees, MD  thiamine 100 MG tablet Take 1 tablet (100 mg total) by mouth daily. 07/30/14   Shanker Levora Dredge, MD  tiotropium (SPIRIVA HANDIHALER) 18 MCG inhalation capsule Place 1 capsule (18 mcg total) into inhaler and inhale daily. 07/30/14 07/30/15  Shanker Levora Dredge, MD    Physical Exam: BP 164/79 mmHg  Pulse 91  Resp 27  SpO2 98%  General: Elderly frail and cachectic appearing black male. Awake and alert and oriented x3. No acute cardiopulmonary distress.  Eyes: Pupils equal, round, reactive to light. Extraocular muscles are intact. Sclerae anicteric and noninjected.  ENT:  Moist mucosal membranes. No mucosal lesions. Teeth in poor repair  Neck: Neck supple without lymphadenopathy. No carotid bruits. No masses palpated.  Cardiovascular: Regular rate with normal S1-S2 sounds. No murmurs, rubs, gallops auscultated. No JVD.  Respiratory: Diminished breath sounds area rales in the bases right greater than left. No wheezes.  Abdomen: Soft, nontender, nondistended. G-tube in place. Area around the G-tube is clean dry and intact, without erythema or tenderness. Active bowel sounds. No masses or hepatosplenomegaly  Skin: Dry, warm to touch. 2+ dorsalis pedis and radial pulses. Musculoskeletal: No calf or leg pain. All major joints not erythematous nontender.  Psychiatric: Intact judgment and insight.  Neurologic: No focal neurological deficits. Cranial nerves II through XII are grossly intact.           Labs on Admission:  Basic Metabolic Panel:  Recent Labs Lab 08/07/14 2034  NA 128*  K 4.2  CL 92*  CO2 27  GLUCOSE 231*  BUN 15  CREATININE 0.72    CALCIUM 8.5*   Liver Function Tests:  Recent Labs Lab 08/07/14 2034  AST 54*  ALT 35  ALKPHOS 93  BILITOT 0.4  PROT 6.7  ALBUMIN 2.4*   No results for input(s): LIPASE, AMYLASE in the last 168 hours. No results for input(s): AMMONIA in the last 168 hours. CBC:  Recent Labs Lab 08/07/14 2034  WBC 15.8*  NEUTROABS 13.8*  HGB 9.0*  HCT 26.1*  MCV 93.2  PLT 358   Cardiac Enzymes: No results for input(s): CKTOTAL, CKMB, CKMBINDEX, TROPONINI in the last 168 hours.  BNP (last 3 results)  Recent Labs  07/17/14 1207 08/07/14 2034  BNP 220.5* 676.8*    ProBNP (last 3 results) No results for input(s): PROBNP in the last 8760 hours.  CBG: No results for input(s): GLUCAP in the last 168 hours.  Radiological Exams on Admission: Dg Chest Portable 1 View  08/07/2014   CLINICAL DATA:  Shortness of breath  EXAM: PORTABLE CHEST - 1 VIEW  COMPARISON:  07/22/2014  FINDINGS: Mild residual scarring/ opacity in the right middle/lower lobe, improved. No pleural effusion or pneumothorax.  The heart is normal in size.  IMPRESSION: Mild residual scarring/ opacity in the right middle/lower lobe,  improved.   Electronically Signed   By: Charline Bills M.D.   On: 08/07/2014 20:34    EKG: Independently reviewed. Sinus rhythm with PACs.  Normal PR and QRS intervals.  QTC 2.479. No ST changes  Assessment/Plan Present on Admission:  . Acute respiratory failure with hypercapnia . Aspiration pneumonia . Hyponatremia  This patient was discussed with the ED physician, including pertinent vitals, physical exam findings, labs, and imaging.  We also discussed care given by the ED provider.  #1 acute respiratory failure with hypercapnia #2 aspiration pneumonia #3 hyponatremia #4 hyperglycemia  Admit to stepdown due to BiPAP and question respiratory status Continue Zosyn Keep patient nothing by mouth due to aspiration risk Consult speech therapy Due to question of fluid status and the  patient will obtain urine osmolality, urine sodium and Retin-A, and serum osmolality in order to ascertain the etiology of the patient's hyponatremia Check metabolic panel in the morning Check CBC in the morning CBG is every 6 hours Sliding scale insulin Check hemoglobin A1c Continue feedings per G-tube   DVT prophylaxis: Lovenox  Consultants: None  Code Status: Full code  Family Communication: None   Disposition Plan: Return to nursing facility following improvement   Levie Heritage, DO [facility] Pager [phone]

## 2014-08-08 ENCOUNTER — Inpatient Hospital Stay (HOSPITAL_COMMUNITY): Payer: Medicare Other

## 2014-08-08 ENCOUNTER — Encounter (HOSPITAL_COMMUNITY): Payer: Self-pay | Admitting: Radiology

## 2014-08-08 DIAGNOSIS — J9602 Acute respiratory failure with hypercapnia: Secondary | ICD-10-CM

## 2014-08-08 DIAGNOSIS — J9690 Respiratory failure, unspecified, unspecified whether with hypoxia or hypercapnia: Secondary | ICD-10-CM | POA: Diagnosis present

## 2014-08-08 DIAGNOSIS — Z978 Presence of other specified devices: Secondary | ICD-10-CM | POA: Insufficient documentation

## 2014-08-08 DIAGNOSIS — L8990 Pressure ulcer of unspecified site, unspecified stage: Secondary | ICD-10-CM | POA: Insufficient documentation

## 2014-08-08 LAB — BASIC METABOLIC PANEL
ANION GAP: 9 (ref 5–15)
Anion gap: 9 (ref 5–15)
Anion gap: 9 (ref 5–15)
BUN: 15 mg/dL (ref 6–20)
BUN: 15 mg/dL (ref 6–20)
BUN: 17 mg/dL (ref 6–20)
CALCIUM: 8 mg/dL — AB (ref 8.9–10.3)
CO2: 26 mmol/L (ref 22–32)
CO2: 26 mmol/L (ref 22–32)
CO2: 26 mmol/L (ref 22–32)
CREATININE: 0.78 mg/dL (ref 0.61–1.24)
CREATININE: 0.89 mg/dL (ref 0.61–1.24)
CREATININE: 0.91 mg/dL (ref 0.61–1.24)
Calcium: 8.1 mg/dL — ABNORMAL LOW (ref 8.9–10.3)
Calcium: 7.9 mg/dL — ABNORMAL LOW (ref 8.9–10.3)
Chloride: 94 mmol/L — ABNORMAL LOW (ref 101–111)
Chloride: 94 mmol/L — ABNORMAL LOW (ref 101–111)
Chloride: 93 mmol/L — ABNORMAL LOW (ref 101–111)
GFR calc Af Amer: >60 mL/min (ref >60)
GFR calc Af Amer: >60 mL/min (ref >60)
GFR calc Af Amer: >60 mL/min (ref >60)
GFR calc non Af Amer: >60 mL/min (ref >60)
GLUCOSE: 134 mg/dL — AB (ref 65–99)
GLUCOSE: 123 mg/dL — AB (ref 65–99)
Glucose, Bld: 109 mg/dL — ABNORMAL HIGH (ref 65–99)
POTASSIUM: 4.5 mmol/L (ref 3.5–5.1)
POTASSIUM: 4.5 mmol/L (ref 3.5–5.1)
Potassium: 4.7 mmol/L (ref 3.5–5.1)
SODIUM: 129 mmol/L — AB (ref 135–145)
Sodium: 129 mmol/L — ABNORMAL LOW (ref 135–145)
Sodium: 128 mmol/L — ABNORMAL LOW (ref 135–145)

## 2014-08-08 LAB — BLOOD GAS, ARTERIAL
Acid-base deficit: 0.7 mmol/L (ref 0.0–2.0)
Bicarbonate: 24.9 mEq/L — ABNORMAL HIGH (ref 20.0–24.0)
DRAWN BY: 437071
FIO2: 100 %
LHR: 20 {breaths}/min
MECHVT: 400 mL
O2 Saturation: 97.7 %
PATIENT TEMPERATURE: 97.3
PEEP/CPAP: 5 cmH2O
PH ART: 7.314 — AB (ref 7.350–7.450)
PO2 ART: 102 mmHg — AB (ref 80.0–100.0)
TCO2: 26.4 mmol/L (ref 0–100)
pCO2 arterial: 49.8 mmHg — ABNORMAL HIGH (ref 35.0–45.0)

## 2014-08-08 LAB — COMPREHENSIVE METABOLIC PANEL
ALK PHOS: 84 U/L (ref 38–126)
ALT: 29 U/L (ref 17–63)
ANION GAP: 9 (ref 5–15)
AST: 36 U/L (ref 15–41)
Albumin: 2.3 g/dL — ABNORMAL LOW (ref 3.5–5.0)
BUN: 15 mg/dL (ref 6–20)
CO2: 29 mmol/L (ref 22–32)
Calcium: 8.3 mg/dL — ABNORMAL LOW (ref 8.9–10.3)
Chloride: 91 mmol/L — ABNORMAL LOW (ref 101–111)
Creatinine, Ser: 0.7 mg/dL (ref 0.61–1.24)
GFR calc Af Amer: >60 mL/min (ref >60)
GFR calc non Af Amer: >60 mL/min (ref >60)
GLUCOSE: 110 mg/dL — AB (ref 65–99)
Potassium: 4.4 mmol/L (ref 3.5–5.1)
Sodium: 129 mmol/L — ABNORMAL LOW (ref 135–145)
TOTAL PROTEIN: 6.2 g/dL — AB (ref 6.5–8.1)
Total Bilirubin: 0.5 mg/dL (ref 0.3–1.2)

## 2014-08-08 LAB — GLUCOSE, CAPILLARY
GLUCOSE-CAPILLARY: 93 mg/dL (ref 65–99)
GLUCOSE-CAPILLARY: 94 mg/dL (ref 65–99)
GLUCOSE-CAPILLARY: 119 mg/dL — AB (ref 65–99)
Glucose-Capillary: 114 mg/dL — ABNORMAL HIGH (ref 65–99)

## 2014-08-08 LAB — MAGNESIUM: Magnesium: 1.2 mg/dL — ABNORMAL LOW (ref 1.7–2.4)

## 2014-08-08 LAB — URINALYSIS, ROUTINE W REFLEX MICROSCOPIC
BILIRUBIN URINE: NEGATIVE
Glucose, UA: NEGATIVE mg/dL
HGB URINE DIPSTICK: NEGATIVE
KETONES UR: NEGATIVE mg/dL
LEUKOCYTES UA: NEGATIVE
Nitrite: NEGATIVE
Protein, ur: NEGATIVE mg/dL
Specific Gravity, Urine: 1.014 (ref 1.005–1.030)
Urobilinogen, UA: 0.2 mg/dL (ref 0.0–1.0)
pH: 6 (ref 5.0–8.0)

## 2014-08-08 LAB — POCT I-STAT 3, ART BLOOD GAS (G3+)
Acid-base deficit: 2 mmol/L (ref 0.0–2.0)
Bicarbonate: 24.1 mEq/L — ABNORMAL HIGH (ref 20.0–24.0)
O2 Saturation: 96 %
PCO2 ART: 42.6 mmHg (ref 35.0–45.0)
TCO2: 25 mmol/L (ref 0–100)
pH, Arterial: 7.356 (ref 7.350–7.450)
pO2, Arterial: 81 mmHg (ref 80.0–100.0)

## 2014-08-08 LAB — CBC
HEMATOCRIT: 26 % — AB (ref 39.0–52.0)
Hemoglobin: 8.9 g/dL — ABNORMAL LOW (ref 13.0–17.0)
MCH: 32.1 pg (ref 26.0–34.0)
MCHC: 34.2 g/dL (ref 30.0–36.0)
MCV: 93.9 fL (ref 78.0–100.0)
Platelets: 396 10*3/uL (ref 150–400)
RBC: 2.77 MIL/uL — AB (ref 4.22–5.81)
RDW: 13.2 % (ref 11.5–15.5)
WBC: 10.8 10*3/uL — ABNORMAL HIGH (ref 4.0–10.5)

## 2014-08-08 LAB — PROCALCITONIN: Procalcitonin: 1.86 ng/mL

## 2014-08-08 LAB — PHOSPHORUS: Phosphorus: 5.2 mg/dL — ABNORMAL HIGH (ref 2.5–4.6)

## 2014-08-08 LAB — OSMOLALITY, URINE: OSMOLALITY UR: 450 mosm/kg (ref 390–1090)

## 2014-08-08 LAB — TROPONIN I
Troponin I: <0.03 ng/mL (ref <0.031)
Troponin I: 0.05 ng/mL — ABNORMAL HIGH (ref <0.031)

## 2014-08-08 LAB — CREATININE, URINE, RANDOM: CREATININE, URINE: 26.17 mg/dL

## 2014-08-08 LAB — PROTIME-INR
INR: 1.06 (ref 0.00–1.49)
Prothrombin Time: 14 seconds (ref 11.6–15.2)

## 2014-08-08 LAB — SODIUM, URINE, RANDOM: SODIUM UR: 127 mmol/L

## 2014-08-08 LAB — LACTIC ACID, PLASMA
LACTIC ACID, VENOUS: 2.1 mmol/L — AB (ref 0.5–2.0)
Lactic Acid, Venous: 1.5 mmol/L (ref 0.5–2.0)

## 2014-08-08 LAB — APTT: aPTT: 31 seconds (ref 24–37)

## 2014-08-08 LAB — OSMOLALITY: Osmolality: 277 mOsm/kg (ref 275–300)

## 2014-08-08 MED ORDER — PANTOPRAZOLE SODIUM 40 MG PO PACK
40.0000 mg | PACK | Freq: Every day | ORAL | Status: DC
  Administered 2014-08-08 – 2014-08-12 (×5): 40 mg
  Filled 2014-08-08 (×5): qty 20

## 2014-08-08 MED ORDER — FENTANYL CITRATE (PF) 100 MCG/2ML IJ SOLN
INTRAMUSCULAR | Status: AC
  Filled 2014-08-08: qty 2

## 2014-08-08 MED ORDER — ALBUTEROL SULFATE HFA 108 (90 BASE) MCG/ACT IN AERS: 4.0000 | INHALATION_SPRAY | Freq: Four times a day (QID) | RESPIRATORY_TRACT | Status: DC

## 2014-08-08 MED ORDER — LEVALBUTEROL HCL 1.25 MG/0.5ML IN NEBU
1.2500 mg | INHALATION_SOLUTION | Freq: Three times a day (TID) | RESPIRATORY_TRACT | Status: DC
  Filled 2014-08-08: qty 0.5

## 2014-08-08 MED ORDER — MAGNESIUM SULFATE 4 GM/100ML IV SOLN
4.0000 g | Freq: Once | INTRAVENOUS | Status: AC
  Administered 2014-08-08: 4 g via INTRAVENOUS
  Filled 2014-08-08: qty 100

## 2014-08-08 MED ORDER — SUCCINYLCHOLINE CHLORIDE 20 MG/ML IJ SOLN
INTRAMUSCULAR | Status: AC
  Filled 2014-08-08: qty 1

## 2014-08-08 MED ORDER — CHLORHEXIDINE GLUCONATE 0.12 % MT SOLN
15.0000 mL | Freq: Two times a day (BID) | OROMUCOSAL | Status: DC
  Administered 2014-08-08 – 2014-08-19 (×24): 15 mL via OROMUCOSAL
  Filled 2014-08-08 (×26): qty 15

## 2014-08-08 MED ORDER — LIDOCAINE HCL (CARDIAC) 20 MG/ML IV SOLN
INTRAVENOUS | Status: AC
  Filled 2014-08-08: qty 5

## 2014-08-08 MED ORDER — IOHEXOL 350 MG/ML SOLN
80.0000 mL | Freq: Once | INTRAVENOUS | Status: AC | PRN
  Administered 2014-08-08: 80 mL via INTRAVENOUS

## 2014-08-08 MED ORDER — VANCOMYCIN HCL IN DEXTROSE 1-5 GM/200ML-% IV SOLN
1000.0000 mg | Freq: Once | INTRAVENOUS | Status: AC
  Administered 2014-08-08: 1000 mg via INTRAVENOUS
  Filled 2014-08-08: qty 200

## 2014-08-08 MED ORDER — NEBIVOLOL HCL 5 MG PO TABS
5.0000 mg | ORAL_TABLET | Freq: Every day | ORAL | Status: DC
  Administered 2014-08-09 – 2014-08-12 (×4): 5 mg via JEJUNOSTOMY
  Filled 2014-08-08 (×5): qty 1

## 2014-08-08 MED ORDER — DEXTROSE 5 % IV SOLN
0.0000 ug/min | INTRAVENOUS | Status: DC
  Administered 2014-08-08: 10 ug/min via INTRAVENOUS

## 2014-08-08 MED ORDER — PREDNISONE 20 MG PO TABS
40.0000 mg | ORAL_TABLET | ORAL | Status: AC
  Administered 2014-08-08: 40 mg via ORAL
  Filled 2014-08-08: qty 2

## 2014-08-08 MED ORDER — SODIUM CHLORIDE 0.9 % IV SOLN
INTRAVENOUS | Status: DC
  Administered 2014-08-08: 10:00:00 via INTRAVENOUS

## 2014-08-08 MED ORDER — ALBUTEROL SULFATE (2.5 MG/3ML) 0.083% IN NEBU
3.0000 mL | INHALATION_SOLUTION | RESPIRATORY_TRACT | Status: DC | PRN
  Administered 2014-08-08: 3 mL via RESPIRATORY_TRACT
  Filled 2014-08-08: qty 3

## 2014-08-08 MED ORDER — ACETYLCYSTEINE 20 % IN SOLN
600.0000 mg | Freq: Three times a day (TID) | RESPIRATORY_TRACT | Status: AC
  Administered 2014-08-08: 600 mg via RESPIRATORY_TRACT
  Administered 2014-08-09: 800 mg via RESPIRATORY_TRACT
  Administered 2014-08-09 (×2): 600 mg via RESPIRATORY_TRACT
  Filled 2014-08-08 (×4): qty 4

## 2014-08-08 MED ORDER — VITAMIN B-1 100 MG PO TABS
100.0000 mg | ORAL_TABLET | Freq: Every day | ORAL | Status: DC
  Administered 2014-08-08 – 2014-08-19 (×12): 100 mg via JEJUNOSTOMY
  Filled 2014-08-08 (×13): qty 1

## 2014-08-08 MED ORDER — IPRATROPIUM BROMIDE HFA 17 MCG/ACT IN AERS: 4.0000 | INHALATION_SPRAY | Freq: Four times a day (QID) | RESPIRATORY_TRACT | Status: DC

## 2014-08-08 MED ORDER — ACETYLCYSTEINE 10 % IN SOLN
4.0000 mL | Freq: Three times a day (TID) | RESPIRATORY_TRACT | Status: DC
  Administered 2014-08-08: 4 mL via RESPIRATORY_TRACT
  Filled 2014-08-08 (×5): qty 4

## 2014-08-08 MED ORDER — ACETAMINOPHEN 650 MG RE SUPP: 650.0000 mg | Freq: Four times a day (QID) | RECTAL | Status: DC | PRN

## 2014-08-08 MED ORDER — FENTANYL CITRATE (PF) 100 MCG/2ML IJ SOLN
50.0000 ug | INTRAMUSCULAR | Status: DC | PRN
  Administered 2014-08-08 – 2014-08-11 (×8): 50 ug via INTRAVENOUS
  Filled 2014-08-08: qty 2

## 2014-08-08 MED ORDER — ENOXAPARIN SODIUM 30 MG/0.3ML ~~LOC~~ SOLN
30.0000 mg | SUBCUTANEOUS | Status: DC
  Administered 2014-08-08 – 2014-08-10 (×3): 30 mg via SUBCUTANEOUS
  Filled 2014-08-08 (×3): qty 0.3

## 2014-08-08 MED ORDER — ONDANSETRON HCL 4 MG PO TABS: 4.0000 mg | ORAL_TABLET | Freq: Four times a day (QID) | ORAL | Status: DC | PRN

## 2014-08-08 MED ORDER — FENTANYL CITRATE (PF) 100 MCG/2ML IJ SOLN
50.0000 ug | INTRAMUSCULAR | Status: AC | PRN
  Administered 2014-08-08 (×3): 50 ug via INTRAVENOUS
  Filled 2014-08-08 (×4): qty 2

## 2014-08-08 MED ORDER — PIPERACILLIN-TAZOBACTAM 3.375 G IVPB
3.3750 g | Freq: Three times a day (TID) | INTRAVENOUS | Status: DC
  Administered 2014-08-08 – 2014-08-10 (×7): 3.375 g via INTRAVENOUS
  Filled 2014-08-08 (×10): qty 50

## 2014-08-08 MED ORDER — ALUM & MAG HYDROXIDE-SIMETH 200-200-20 MG/5ML PO SUSP: 30.0000 mL | Freq: Four times a day (QID) | ORAL | Status: DC | PRN

## 2014-08-08 MED ORDER — INSULIN ASPART 100 UNIT/ML ~~LOC~~ SOLN
1.0000 [IU] | SUBCUTANEOUS | Status: DC
  Administered 2014-08-08 – 2014-08-11 (×4): 1 [IU] via SUBCUTANEOUS
  Administered 2014-08-11 (×3): 2 [IU] via SUBCUTANEOUS
  Administered 2014-08-12 (×2): 1 [IU] via SUBCUTANEOUS

## 2014-08-08 MED ORDER — POTASSIUM CHLORIDE 20 MEQ/15ML (10%) PO SOLN: 20.0000 meq | Freq: Every day | ORAL | Status: DC

## 2014-08-08 MED ORDER — TIOTROPIUM BROMIDE MONOHYDRATE 18 MCG IN CAPS
18.0000 ug | ORAL_CAPSULE | Freq: Every day | RESPIRATORY_TRACT | Status: DC
  Filled 2014-08-08: qty 5

## 2014-08-08 MED ORDER — ACETAMINOPHEN 325 MG PO TABS: 650.0000 mg | ORAL_TABLET | Freq: Four times a day (QID) | ORAL | Status: DC | PRN

## 2014-08-08 MED ORDER — MIDAZOLAM HCL 2 MG/2ML IJ SOLN
INTRAMUSCULAR | Status: AC
  Administered 2014-08-08: 2 mg
  Filled 2014-08-08: qty 2

## 2014-08-08 MED ORDER — VANCOMYCIN HCL IN DEXTROSE 750-5 MG/150ML-% IV SOLN
750.0000 mg | INTRAVENOUS | Status: DC
  Administered 2014-08-09 – 2014-08-10 (×2): 750 mg via INTRAVENOUS
  Filled 2014-08-08 (×2): qty 150

## 2014-08-08 MED ORDER — OSMOLITE 1.2 CAL PO LIQD
1000.0000 mL | ORAL | Status: DC
  Filled 2014-08-08 (×2): qty 1000

## 2014-08-08 MED ORDER — BUDESONIDE-FORMOTEROL FUMARATE 160-4.5 MCG/ACT IN AERO
2.0000 | INHALATION_SPRAY | Freq: Two times a day (BID) | RESPIRATORY_TRACT | Status: DC
  Filled 2014-08-08: qty 6

## 2014-08-08 MED ORDER — IPRATROPIUM-ALBUTEROL 0.5-2.5 (3) MG/3ML IN SOLN
3.0000 mL | Freq: Four times a day (QID) | RESPIRATORY_TRACT | Status: DC
  Administered 2014-08-08 – 2014-08-20 (×50): 3 mL via RESPIRATORY_TRACT
  Filled 2014-08-08 (×50): qty 3

## 2014-08-08 MED ORDER — CETYLPYRIDINIUM CHLORIDE 0.05 % MT LIQD
7.0000 mL | Freq: Four times a day (QID) | OROMUCOSAL | Status: DC
  Administered 2014-08-08 – 2014-08-20 (×46): 7 mL via OROMUCOSAL

## 2014-08-08 MED ORDER — ETOMIDATE 2 MG/ML IV SOLN
INTRAVENOUS | Status: AC
  Filled 2014-08-08: qty 20

## 2014-08-08 MED ORDER — FENTANYL CITRATE (PF) 100 MCG/2ML IJ SOLN
INTRAMUSCULAR | Status: AC
  Administered 2014-08-08: 50 ug
  Filled 2014-08-08: qty 2

## 2014-08-08 MED ORDER — ONDANSETRON HCL 4 MG/2ML IJ SOLN: 4.0000 mg | Freq: Four times a day (QID) | INTRAMUSCULAR | Status: DC | PRN

## 2014-08-08 MED ORDER — PREDNISONE 20 MG PO TABS
40.0000 mg | ORAL_TABLET | Freq: Every day | ORAL | Status: AC
  Administered 2014-08-09 – 2014-08-12 (×4): 40 mg via ORAL
  Filled 2014-08-08 (×5): qty 2

## 2014-08-08 MED ORDER — ROCURONIUM BROMIDE 50 MG/5ML IV SOLN
INTRAVENOUS | Status: AC
  Administered 2014-08-08: 30 mg
  Filled 2014-08-08: qty 2

## 2014-08-08 MED ORDER — INSULIN ASPART 100 UNIT/ML ~~LOC~~ SOLN: 0.0000 [IU] | Freq: Three times a day (TID) | SUBCUTANEOUS | Status: DC

## 2014-08-08 MED ORDER — SODIUM CHLORIDE 0.9 % IV BOLUS (SEPSIS)
1000.0000 mL | Freq: Once | INTRAVENOUS | Status: AC
  Administered 2014-08-08: 1000 mL via INTRAVENOUS

## 2014-08-08 MED ORDER — DEXTROSE 5 % IV SOLN
0.0000 ug/min | INTRAVENOUS | Status: DC
  Filled 2014-08-08: qty 4

## 2014-08-08 NOTE — Progress Notes (Addendum)
PULMONARY / CRITICAL CARE MEDICINE   Name: Phillip May MRN: [id number] DOB: Jul 08, 1945    ADMISSION DATE:  08/07/2014  CHIEF COMPLAINT:  SOB  INITIAL PRESENTATION: 34 M with severe COPD, recent hospitalization for aspiration who was transferred to [facility] on 6/25 for worsening SOB likely 2/2 aspiration. In acute on chronic hypercarbic RF on arrival and placed on BiPAP; he did not improve and PCCM was consulted for further management.    STUDIES:  ABG 7.232/72.9/85 --> 7.175/80.2/76.0 after starting BiPAP Chest X-ray 6/25 -- > Resolving RLL opacity  SIGNIFICANT EVENTS: Intubation 08/08/2014  VITAL SIGNS: Temp:  [97.1 F (36.2 C)-98.1 F (36.7 C)] 98.1 F (36.7 C) (06/26 0800) Pulse Rate:  [36-101] 92 (06/26 0800) Resp:  [18-37] 24 (06/26 0800) BP: (89-213)/(67-140) 93/78 mmHg (06/26 0800) SpO2:  [80 %-100 %] 100 % (06/26 0800) Arterial Line BP: (96-175)/(42-79) 110/51 mmHg (06/26 0800) FiO2 (%):  [60 %-100 %] 60 % (06/26 0800) Weight:  [45.904 kg (101 lb 3.2 oz)] 45.904 kg (101 lb 3.2 oz) (06/25 2300) HEMODYNAMICS:   VENTILATOR SETTINGS: Vent Mode:  [-] PRVC FiO2 (%):  [60 %-100 %] 60 % Set Rate:  [18 bmp-20 bmp] 20 bmp Vt Set:  [400 mL-500 mL] 400 mL PEEP:  [5 cmH20] 5 cmH20 Plateau Pressure:  [25 cmH20-35 cmH20] 25 cmH20 INTAKE / OUTPUT:  Intake/Output Summary (Last 24 hours) at 08/08/14 0915 Last data filed at 08/08/14 0800  Gross per 24 hour  Intake  314.5 ml  Output    200 ml  Net  114.5 ml    PHYSICAL EXAMINATION: General:  Elderly-appearing M in NAD Neuro:  Opens eyes to name, no response to commands  HEENT:  Sclera anicteric, conjunctiva pink, MMM, Poor dentition, orally intubated  Cardiovascular:  Tachycardic, RR, NS1/S2, (-) MRG Lungs:  Diffuse Coarse Rhonchi Bilaterally Abdomen:  S/NT/NT/(+)BS Musculoskeletal:  (-) C/C/E Skin:  Grossly Intact  LABS:  CBC  Recent Labs Lab 08/07/14 2034 08/08/14 0145  WBC 15.8* 10.8*  HGB 9.0* 8.9*  HCT 26.1*  26.0*  PLT 358 396   Coag's  Recent Labs Lab 08/08/14 0145  APTT 31  INR 1.06   BMET  Recent Labs Lab 08/07/14 2034 08/08/14 0145 08/08/14 0718  NA 128* 129* 129*  K 4.2 4.4 4.5  CL 92* 91* 94*  CO2 27 29 26   BUN 15 15 15   CREATININE 0.72 0.70 0.78  GLUCOSE 231* 110* 109*   Electrolytes  Recent Labs Lab 08/07/14 2034 08/08/14 0145 08/08/14 0718  CALCIUM 8.5* 8.3* 8.0*  MG  --  1.2*  --   PHOS  --  5.2*  --    Sepsis Markers  Recent Labs Lab 08/07/14 2024 08/08/14 0145 08/08/14 0219  LATICACIDVEN 1.60  --  1.5  PROCALCITON  --  1.86  --    ABG  Recent Labs Lab 08/07/14 2354 08/08/14 0224 08/08/14 0505  PHART 7.175* 7.356 7.314*  PCO2ART 80.2* 42.6 49.8*  PO2ART 76.0* 81.0 102*   Liver Enzymes  Recent Labs Lab 08/07/14 2034 08/08/14 0145  AST 54* 36  ALT 35 29  ALKPHOS 93 84  BILITOT 0.4 0.5  ALBUMIN 2.4* 2.3*   Cardiac Enzymes  Recent Labs Lab 08/08/14 0145 08/08/14 0718  TROPONINI <0.03 <0.03   Glucose  Recent Labs Lab 08/08/14 0437 08/08/14 0806  GLUCAP 93 94    Imaging Ct Angio Chest Pe W/cm &/or Wo Cm  08/08/2014   CLINICAL DATA:  Acute onset of respiratory distress. Initial  encounter.  EXAM: CT ANGIOGRAPHY CHEST WITH CONTRAST  TECHNIQUE: Multidetector CT imaging of the chest was performed using the standard protocol during bolus administration of intravenous contrast. Multiplanar CT image reconstructions and MIPs were obtained to evaluate the vascular anatomy.  CONTRAST:  80mL OMNIPAQUE IOHEXOL 350 MG/ML SOLN  COMPARISON:  Chest radiograph performed earlier today at 2:27 a.m.  FINDINGS: There is no evidence of pulmonary embolus.  There is dense opacification of both lower lung lobes, concerning for bilateral lower lobe pneumonia. Minimal patchy opacities are seen within the right upper lobe. Bilateral emphysematous change is seen at the upper lung lobes. No definite pleural effusion or pneumothorax is seen. There is layering  fluid within the right mainstem bronchus, concerning for aspiration. No masses are identified; no abnormal focal contrast enhancement is seen.  The mediastinum is unremarkable in appearance. No mediastinal lymphadenopathy is seen. No pericardial effusion is identified. The great vessels are grossly unremarkable in appearance. The patient's endotracheal tube is seen ending 3 cm above the carina. No axillary lymphadenopathy is seen. The thyroid gland is unremarkable in appearance.  The visualized portions of the liver and spleen are unremarkable. The pancreas, adrenal glands and kidneys are not well assessed due to surrounding edema. Scattered calcification is noted along the proximal abdominal aorta.  No acute osseous abnormalities are seen. Cervical spinal fusion hardware is noted. There is mild chronic compression deformity involving vertebral body L1.  Review of the MIP images confirms the above findings.  IMPRESSION: 1. No evidence of pulmonary embolus. 2. Dense bilateral lower lobe airspace opacification, compatible with bilateral lower lobe pneumonia. Mild patchy opacities seen within the right upper lobe. Layering fluid noted within the right mainstem bronchus, concerning for aspiration. 3. Bilateral emphysematous change at the upper lung lobes.   Electronically Signed   By: Roanna Raider M.D.   On: 08/08/2014 04:08   Dg Chest Port 1 View  08/08/2014   CLINICAL DATA:  ET tube placement  EXAM: PORTABLE CHEST - 1 VIEW  COMPARISON:  08/08/2014  FINDINGS: ET tube tip is above the carina. Normal heart size. Small bilateral pleural effusions persist. No airspace consolidation.  IMPRESSION: Persistent bilateral pleural effusions.   Electronically Signed   By: Signa Kell M.D.   On: 08/08/2014 08:07   Dg Chest Port 1 View  08/08/2014   CLINICAL DATA:  Respiratory failure. Endotracheal tube placement. Initial encounter.  EXAM: PORTABLE CHEST - 1 VIEW  COMPARISON:  Chest radiograph performed 08/07/2014   FINDINGS: The patient's endotracheal tube appears to end at or just below the carina. This could be retracted 2-3 cm.  Dense right basilar airspace opacification raises concern for pneumonia. More mild left basilar airspace opacity is seen. A small right pleural effusion is suspected. No pneumothorax is seen.  The cardiomediastinal silhouette is borderline normal in size. No acute osseous abnormalities are identified. Cervical spinal fusion hardware is partially imaged.  IMPRESSION: 1. Endotracheal tube seen ending at or just below the carina. This could be retracted 2-3 cm. 2. Dense right basilar airspace opacification raises concern for worsening pneumonia. More mild left basilar airspace opacity seen. Suspect small right pleural effusion.  These results were called by telephone at the time of interpretation on 08/08/2014 at 2:50 am to Nursing on [facility], who verbally acknowledged these results.   Electronically Signed   By: Roanna Raider M.D.   On: 08/08/2014 02:50   Dg Chest Portable 1 View  08/07/2014   CLINICAL DATA:  Shortness of breath  EXAM: PORTABLE CHEST - 1 VIEW  COMPARISON:  07/22/2014  FINDINGS: Mild residual scarring/ opacity in the right middle/lower lobe, improved. No pleural effusion or pneumothorax.  The heart is normal in size.  IMPRESSION: Mild residual scarring/ opacity in the right middle/lower lobe, improved.   Electronically Signed   By: Charline Bills M.D.   On: 08/07/2014 20:34   Favor pna > effusions given CT findings. Looks like element of RML collapse   ASSESSMENT / PLAN:  PULMONARY OETT Post-intubation X-ray pending A:  Acute Hypoxic and Hypercarbic RF: favor aspiration given CT findings w/ dense bibasilar consolidation  Aspiration vs HCAP COPD ATX bases on CT P:   Lung protective ventilation VAP prevention SBT/WUA Prednisone taper  Standing and PRN nebs Add mucomyst x 48hrs  Empiric Coverage for HCAP given recent hospitalization   CARDIOVASCULAR CVL  None A:  Elevated BNP: Not grossly fluid overload on exam. Unclear significance. P:  Echocardiogram pending  Tele + volume status   RENAL A:   Hyponatremia: Most consistent with hypovolemia vs. NDI. P:   Cont NS resuscitation  I&O F/u chem   GASTROINTESTINAL A:   Dysphagia and aspiration  P: Start tubefeeds  HEMATOLOGIC A:   AOCD P:  Monitor Transfuse per ICU protocol   INFECTIOUS A:  Likely aspiration event P:   BCx2 6/25>>> UC:6/25>>> Sputum: 6/25>>> Abx:  Vanc and Zosyn, start date 6/26, day 1/8  ENDOCRINE A:  Elevated blood glucose: Noted on prior admission labs   P:   SSI A1c pending   NEUROLOGIC A:  Alcoholism Hx P: Cont Nutritional Supplementation PAD protocol   FAMILY  - Updates: Discussed current situation in depth with both son, [place], and daughter, Renda Rolls. At this point they want to pursue aggressive care but both note that patient didn't want "life support". They are discussing their ultimate goals of care.  - Inter-disciplinary family meet or Palliative Care meeting due by:  7/3    TODAY'S SUMMARY:  Stable s/p intubation. Suspect HCAP vs recurrent aspiration. Will cont IVFs, abx, and supportive care. Await cultures and narrow as indicated. Will start tube feeds and add mucomyst   08/08/2014, 9:15 AM   STAFF NOTE: I, Rory Percy, MD FACP have personally reviewed patient's available data, including medical history, events of note, physical examination and test results as part of my evaluation. I have discussed with resident/NP and other care providers such as pharmacist, RN and RRT. In addition, I personally evaluated patient and elicited key findings of: failed NIMV, ETT placed, basilar ronchi, aspiration likely, ABG reviewed, may need increase rate, CT reviewed, agree likely aspiration with history dyspagia, vanc, zosyn , follows putum, start TF, would limit steroids and reduce when able, pcxr in am , concern atx / colapse, mucomysts, may  need to escalate peep, chest pt The patient is critically ill with multiple organ systems failure and requires high complexity decision making for assessment and support, frequent evaluation and titration of therapies, application of advanced monitoring technologies and extensive interpretation of multiple databases.   Critical Care Time devoted to patient care services described in this note is 30 Minutes. This time reflects time of care of this signee: Rory Percy, MD FACP. This critical care time does not reflect procedure time, or teaching time or supervisory time of PA/NP/Med student/Med Resident etc but could involve care discussion time. Rest per NP/medical resident whose note is outlined above and that I agree with   Phillip May. Phillip Alias, MD, FACP Pgr: [phone] [facility] 08/08/2014 1:40 PM

## 2014-08-08 NOTE — Procedures (Addendum)
Intubation Procedure Note Phillip May [id number] 1945-09-14  Procedure: Intubation Indications: Respiratory insufficiency  Procedure Details Consent: Risks of procedure as well as the alternatives and risks of each were explained to the (patient/caregiver).  Consent for procedure obtained. Time Out: Verified patient identification, verified procedure, site/side was marked, verified correct patient position, special equipment/implants available, medications/allergies/relevent history reviewed, required imaging and test results available.  Performed  Maximum sterile technique was used including antiseptics, cap, gloves, gown, hand hygiene and mask.  MAC and 3    Evaluation Hemodynamic Status: BP stable throughout; O2 sats: stable throughout Patient's Current Condition: stable Complications: No apparent complications Patient did tolerate procedure well. Chest X-ray ordered to verify placement.  CXR: pending.   Rolland Bimler Jessica Priest 08/08/2014

## 2014-08-08 NOTE — Procedures (Signed)
Arterial Catheter Insertion Procedure Note Phillip May [id number] Feb 17, 1945  Procedure: Insertion of Arterial Catheter  Indications: Blood pressure monitoring and Frequent blood sampling  Procedure Details Consent: Unable to obtain consent because of altered level of consciousness. Time Out: Verified patient identification, verified procedure, site/side was marked, verified correct patient position, special equipment/implants available, medications/allergies/relevent history reviewed, required imaging and test results available.  Performed  Maximum sterile technique was used including antiseptics, cap, gloves, gown, hand hygiene, mask and sheet. Skin prep: Chlorhexidine; local anesthetic administered 20 gauge catheter was inserted into right radial artery using the Seldinger technique.  Evaluation Blood flow good; BP tracing good. Complications: No apparent complications.   Azucena Freed 08/08/2014  Inserted art line with one attempt with no apparent complications, patient is stable.

## 2014-08-08 NOTE — Progress Notes (Addendum)
ANTIBIOTIC CONSULT NOTE - INITIAL  Pharmacy Consult for Zosyn  Indication: Aspiration PNA  No Known Allergies  Patient Measurements: Height: 5\' 7"  (170.2 cm) Weight: 101 lb 3.2 oz (45.904 kg) IBW/kg (Calculated) : 66.1   Vital Signs: Temp: 97.1 F (36.2 C) (06/25 2300) Temp Source: Axillary (06/25 2300) BP: 135/86 mmHg (06/25 2300) Pulse Rate: 76 (06/26 0002)  Labs:  Recent Labs  08/07/14 2034  WBC 15.8*  HGB 9.0*  PLT 358  CREATININE 0.72   Estimated Creatinine Clearance: 57.4 mL/min (by C-G formula based on Cr of 0.72).    Medical History: Past Medical History  Diagnosis Date  . Inguinal hernia   . COPD (chronic obstructive pulmonary disease)   . GERD (gastroesophageal reflux disease)   . Chronic hoarseness   . Tobacco abuse   . Cervical spinal stenosis     C4-C5; C5-C6; severe stenosis and abnormal  cord signal C6-C7 s/p decompression in 04/2004    Assessment: 69 y/o M s/p aspiration event at nursing facility, WBC is elevated, renal function ok, CXR unremarkable.   Plan:  -Zosyn 3.375G IV q8h to be infused over 4 hours -Trend WBC, temp, renal function   Phillip May, Phillip May 08/08/2014,12:42 AM  Addendum: Adding vancomycin for aspiration PNA.  Will give vancomycin 1 g IV now, then 750 mg IV q24h  Geannie Risen, PharmD, BCPS  08/08/2014 2:22 AM

## 2014-08-08 NOTE — Progress Notes (Addendum)
PULMONARY / CRITICAL CARE MEDICINE   Name: Phillip May MRN: [id number] DOB: Sep 09, 1945    ADMISSION DATE:  08/07/2014  CHIEF COMPLAINT:  SOB  INITIAL PRESENTATION: 69 M with severe COPD, recent hospitalization for aspiration who was transferred to [facility] on 6/25 for worsening SOB likely 2/2 aspiration. In acute on chronic hypercarbic RF on arrival and placed on BiPAP; he did not improve and PCCM was consulted for further management.    STUDIES:  ABG 7.232/72.9/85 --> 7.175/80.2/76.0 after starting BiPAP Chest X-ray 6/25 -- > Resolving RLL opacity  SIGNIFICANT EVENTS: Intubation 08/08/2014  HISTORY OF PRESENT ILLNESS:  Phillip May is a 25 M with severe COPD and recurrent aspiration s/p recent hospital admission for fall with femur fracture and subsequent aspiration --> PEG. He was admitted to the hospitalist service early this evening with SOB and was placed on BiPAP. Per their notes he had a witnessed aspiration event. He did not improve and PCCM consulted for further management. He is currently unable to provide any history.   PAST MEDICAL HISTORY :   has a past medical history of Inguinal hernia; COPD (chronic obstructive pulmonary disease); GERD (gastroesophageal reflux disease); Chronic hoarseness; Tobacco abuse; and Cervical spinal stenosis.  has past surgical history that includes Inguinal hernia repair; Posterior laminectomy / decompression cervical spine (2006); Intramedullary (im) nail intertrochanteric (Left, 07/20/2014); laparoscopy (N/A, 07/28/2014); and PEG placement (07/28/2014). Prior to Admission medications   Medication Sig Start Date End Date Taking? Authorizing Provider  budesonide-formoterol (SYMBICORT) 160-4.5 MCG/ACT inhaler Inhale 2 puffs into the lungs 2 (two) times daily.   Yes Historical Provider, MD  enoxaparin (LOVENOX) 30 MG/0.3ML injection Inject 0.3 mLs (30 mg total) into the skin daily. FOR 30 DAYS FROM 07/20/14 and then stop 07/30/14  Yes Shanker Levora Dredge, MD   folic acid (FOLVITE) 1 MG tablet Take 1 tablet (1 mg total) by mouth daily. 07/30/14  Yes Shanker Levora Dredge, MD  guaiFENesin (MUCINEX) 600 MG 12 hr tablet Take 600 mg by mouth 2 (two) times daily.    Yes Historical Provider, MD  HYDROcodone-acetaminophen (NORCO/VICODIN) 5-325 MG per tablet Take 1-2 tablets by mouth every 6 (six) hours as needed for moderate pain. 07/30/14  Yes Shanker Levora Dredge, MD  levalbuterol (XOPENEX) 1.25 MG/0.5ML nebulizer solution Take 1.25 mg by nebulization 3 (three) times daily. 07/30/14  Yes Shanker Levora Dredge, MD  Multiple Vitamin (MULTIVITAMIN WITH MINERALS) TABS tablet Take 1 tablet by mouth daily. 07/30/14  Yes Shanker Levora Dredge, MD  nebivolol (BYSTOLIC) 5 MG tablet Take 1 tablet (5 mg total) by mouth daily. 07/30/14  Yes Shanker Levora Dredge, MD  Nutritional Supplements (FEEDING SUPPLEMENT, OSMOLITE 1.2 CAL,) LIQD Place 1,000 mLs into feeding tube continuous. Goal of 40 cc/hr Patient taking differently: Place 60 mL/hr into feeding tube continuous.  07/30/14  Yes Shanker Levora Dredge, MD  thiamine 100 MG tablet Take 1 tablet (100 mg total) by mouth daily. 07/30/14  Yes Shanker Levora Dredge, MD  tiotropium (SPIRIVA HANDIHALER) 18 MCG inhalation capsule Place 1 capsule (18 mcg total) into inhaler and inhale daily. 07/30/14 07/30/15 Yes Shanker Levora Dredge, MD  Water For Irrigation, Sterile (FREE WATER) SOLN Place 150 mLs into feeding tube every 6 (six) hours.   Yes Historical Provider, MD   No Known Allergies  FAMILY HISTORY:  has no family status information on file.  SOCIAL HISTORY:  reports that he has been smoking Cigarettes.  He has a 6 pack-year smoking history. He has never used smokeless tobacco. He  reports that he drinks about 7.0 oz of alcohol per week. He reports that he does not use illicit drugs.  REVIEW OF SYSTEMS:  Unable to obtain secondary to patient condition.   SUBJECTIVE:   VITAL SIGNS: Temp:  [97.1 F (36.2 C)] 97.1 F (36.2 C) (06/25 2300) Pulse Rate:   [70-101] 76 (06/26 0002) Resp:  [18-32] 32 (06/26 0002) BP: (135-184)/(72-103) 135/86 mmHg (06/25 2300) SpO2:  [81 %-100 %] 98 % (06/26 0002) Weight:  [101 lb 3.2 oz (45.904 kg)] 101 lb 3.2 oz (45.904 kg) (06/25 2300) HEMODYNAMICS:   VENTILATOR SETTINGS:   INTAKE / OUTPUT:  Intake/Output Summary (Last 24 hours) at 08/08/14 0059 Last data filed at 08/07/14 2252  Gross per 24 hour  Intake     50 ml  Output      0 ml  Net     50 ml    PHYSICAL EXAMINATION: General:  Elderly-appearing M in NAD Neuro:  Opens eyes to name, no response to commands  HEENT:  Sclera anicteric, conjunctiva pink, MMM, Poor dentition Cardiovascular:  Tachycardic, RR, NS1/S2, (-) MRG Lungs:  Diffuse Coarse Rhonchi Bilaterally Abdomen:  S/NT/NT/(+)BS Musculoskeletal:  (-) C/C/E Skin:  Grossly Intact  LABS:  CBC  Recent Labs Lab 08/07/14 2034  WBC 15.8*  HGB 9.0*  HCT 26.1*  PLT 358   Coag's No results for input(s): APTT, INR in the last 168 hours. BMET  Recent Labs Lab 08/07/14 2034  NA 128*  K 4.2  CL 92*  CO2 27  BUN 15  CREATININE 0.72  GLUCOSE 231*   Electrolytes  Recent Labs Lab 08/07/14 2034  CALCIUM 8.5*   Sepsis Markers  Recent Labs Lab 08/07/14 2024  LATICACIDVEN 1.60   ABG  Recent Labs Lab 08/07/14 2055 08/07/14 2354  PHART 7.232* 7.175*  PCO2ART 72.9* 80.2*  PO2ART 85.0 76.0*   Liver Enzymes  Recent Labs Lab 08/07/14 2034  AST 54*  ALT 35  ALKPHOS 93  BILITOT 0.4  ALBUMIN 2.4*   Cardiac Enzymes No results for input(s): TROPONINI, PROBNP in the last 168 hours. Glucose No results for input(s): GLUCAP in the last 168 hours.  Imaging Dg Chest Portable 1 View  08/07/2014   CLINICAL DATA:  Shortness of breath  EXAM: PORTABLE CHEST - 1 VIEW  COMPARISON:  07/22/2014  FINDINGS: Mild residual scarring/ opacity in the right middle/lower lobe, improved. No pleural effusion or pneumothorax.  The heart is normal in size.  IMPRESSION: Mild residual  scarring/ opacity in the right middle/lower lobe, improved.   Electronically Signed   By: Charline Bills M.D.   On: 08/07/2014 20:34     ASSESSMENT / PLAN:  PULMONARY OETT Post-intubation X-ray pending A:  Acute Hypoxic and Hypercarbic RF: DDx includes repeat aspiration/HCAP, COPD exacerbation, and PE given recent hip fracture. After extensive discussion with son and daughter [facility]) decision made to intubate patient.  COPD: P:   Lung protective ventilation VAP prevention SBT/WUA PE-protocol CT Prednisone Standing and PRN nebs Empiric Coverage for HCAP given recent hospitalization   CARDIOVASCULAR CVL None A:  Elevated BNP: Not grossly fluid overload on exam. Unclear significance. P:  Echocardiogram Serial Trops  RENAL A:   Hyponatremia: Most consistent with hypovolemia vs. NDI. P:   IVF Monitor  GASTROINTESTINAL A:  No acute issue  HEMATOLOGIC A:   AOCD P:  Monitor  INFECTIOUS A:  Likely aspiration event P:   BCx2 : Pending UC: Pending Sputum: Pending Abx:  Vanc and Zosyn, start date 6/26,  day 1/8  ENDOCRINE A:  Elevated blood glucose: Noted on prior admission labs   P:   SSI A1c  NEUROLOGIC A:  Alcoholism Hx P: Cont Nutritional Supplementation  FAMILY  - Updates: Discussed current situation in depth with both son, [place], and daughter, Renda Rolls. At this point they want to pursue aggressive care but both note that patient didn't want "life support". They are discussing their ultimate goals of care.  - Inter-disciplinary family meet or Palliative Care meeting due by:  7/3    TODAY'S SUMMARY:   CRITICAL CARE: The patient is critically ill with multiple organ systems failure and requires high complexity decision making for assessment and support, frequent evaluation and titration of therapies, application of advanced monitoring technologies and extensive interpretation of multiple databases. Critical Care Time devoted to patient care services  described in this note is 100 minutes.  Evalyn Casco, MD Pulmonary and Critical Care Medicine [facility] Pager: [phone]  08/08/2014, 12:59 AM

## 2014-08-08 NOTE — Procedures (Signed)
Intubation Procedure Note Phillip May [id number] 04-07-1945  Procedure: Intubation Indications: Respiratory insufficiency  Procedure Details Consent: Risks of procedure as well as the alternatives and risks of each were explained to the (patient/caregiver).  Consent for procedure obtained. Time Out: Verified patient identification, verified procedure, site/side was marked, verified correct patient position, special equipment/implants available, medications/allergies/relevent history reviewed, required imaging and test results available.  Performed  Maximum sterile technique was used including gloves and mask.   Sedated with 4 Versed and 50 Fentanyl Paralyzed with 30 Roc Prior to intubation lip abrasion present Intubated with 3 Glidescope with 1 attempt Positioning confirmed with ascultation and capnography Secured at 24 cm at lip   Evaluation Hemodynamic Status: Persistent hypotension treated with pressors and fluid; O2 sats: stable throughout Patient's Current Condition: stable Complications: No apparent complications Patient did tolerate procedure well. Chest X-ray ordered to verify placement.  CXR: pending.   Phillip May R. 08/08/2014

## 2014-08-08 NOTE — Progress Notes (Signed)
Withdrew tube 2cm per MD following chest xray. RT will continue to monitor.

## 2014-08-09 ENCOUNTER — Inpatient Hospital Stay (HOSPITAL_COMMUNITY): Payer: Medicare Other

## 2014-08-09 ENCOUNTER — Ambulatory Visit (HOSPITAL_COMMUNITY): Payer: Medicare Other

## 2014-08-09 DIAGNOSIS — J690 Pneumonitis due to inhalation of food and vomit: Principal | ICD-10-CM

## 2014-08-09 DIAGNOSIS — J9602 Acute respiratory failure with hypercapnia: Secondary | ICD-10-CM

## 2014-08-09 DIAGNOSIS — I429 Cardiomyopathy, unspecified: Secondary | ICD-10-CM

## 2014-08-09 LAB — CBC WITH DIFFERENTIAL/PLATELET
Basophils Absolute: 0 10*3/uL (ref 0.0–0.1)
Basophils Relative: 0 % (ref 0–1)
EOS ABS: 0 10*3/uL (ref 0.0–0.7)
Eosinophils Relative: 0 % (ref 0–5)
HEMATOCRIT: 22.2 % — AB (ref 39.0–52.0)
Hemoglobin: 7.9 g/dL — ABNORMAL LOW (ref 13.0–17.0)
LYMPHS PCT: 4 % — AB (ref 12–46)
Lymphs Abs: 0.6 10*3/uL — ABNORMAL LOW (ref 0.7–4.0)
MCH: 32.8 pg (ref 26.0–34.0)
MCHC: 35.6 g/dL (ref 30.0–36.0)
MCV: 92.1 fL (ref 78.0–100.0)
MONO ABS: 0.6 10*3/uL (ref 0.1–1.0)
Monocytes Relative: 4 % (ref 3–12)
NEUTROS PCT: 92 % — AB (ref 43–77)
Neutro Abs: 11.9 10*3/uL — ABNORMAL HIGH (ref 1.7–7.7)
Platelets: 320 10*3/uL (ref 150–400)
RBC: 2.41 MIL/uL — ABNORMAL LOW (ref 4.22–5.81)
RDW: 13.6 % (ref 11.5–15.5)
WBC: 13 10*3/uL — AB (ref 4.0–10.5)

## 2014-08-09 LAB — COMPREHENSIVE METABOLIC PANEL
ALK PHOS: 72 U/L (ref 38–126)
ALT: 23 U/L (ref 17–63)
AST: 34 U/L (ref 15–41)
Albumin: 2.1 g/dL — ABNORMAL LOW (ref 3.5–5.0)
Anion gap: 8 (ref 5–15)
BUN: 22 mg/dL — ABNORMAL HIGH (ref 6–20)
CO2: 28 mmol/L (ref 22–32)
Calcium: 8.1 mg/dL — ABNORMAL LOW (ref 8.9–10.3)
Chloride: 94 mmol/L — ABNORMAL LOW (ref 101–111)
Creatinine, Ser: 1.22 mg/dL (ref 0.61–1.24)
GFR calc non Af Amer: 59 mL/min — ABNORMAL LOW (ref >60)
GLUCOSE: 102 mg/dL — AB (ref 65–99)
Potassium: 4.8 mmol/L (ref 3.5–5.1)
Sodium: 130 mmol/L — ABNORMAL LOW (ref 135–145)
Total Bilirubin: 0.5 mg/dL (ref 0.3–1.2)
Total Protein: 6.2 g/dL — ABNORMAL LOW (ref 6.5–8.1)

## 2014-08-09 LAB — GLUCOSE, CAPILLARY
GLUCOSE-CAPILLARY: 75 mg/dL (ref 65–99)
GLUCOSE-CAPILLARY: 90 mg/dL (ref 65–99)
Glucose-Capillary: 124 mg/dL — ABNORMAL HIGH (ref 65–99)
Glucose-Capillary: 100 mg/dL — ABNORMAL HIGH (ref 65–99)
Glucose-Capillary: 76 mg/dL (ref 65–99)
Glucose-Capillary: 90 mg/dL (ref 65–99)

## 2014-08-09 LAB — POCT I-STAT 3, ART BLOOD GAS (G3+)
Acid-Base Excess: 2 mmol/L (ref 0.0–2.0)
Bicarbonate: 28.3 mEq/L — ABNORMAL HIGH (ref 20.0–24.0)
O2 Saturation: 97 %
Patient temperature: 98.6
TCO2: 30 mmol/L (ref 0–100)
pCO2 arterial: 51.2 mmHg — ABNORMAL HIGH (ref 35.0–45.0)
pH, Arterial: 7.35 (ref 7.350–7.450)
pO2, Arterial: 92 mmHg (ref 80.0–100.0)

## 2014-08-09 LAB — PROCALCITONIN: PROCALCITONIN: 30.74 ng/mL

## 2014-08-09 MED ORDER — VITAL HIGH PROTEIN PO LIQD: 1000.0000 mL | ORAL | Status: DC

## 2014-08-09 MED ORDER — SODIUM CHLORIDE 0.9 % IV BOLUS (SEPSIS)
1000.0000 mL | Freq: Once | INTRAVENOUS | Status: AC
  Administered 2014-08-09: 1000 mL via INTRAVENOUS

## 2014-08-09 MED ORDER — VITAL AF 1.2 CAL PO LIQD
1000.0000 mL | ORAL | Status: DC
  Administered 2014-08-09 – 2014-08-16 (×6): 1000 mL
  Filled 2014-08-09 (×11): qty 1000

## 2014-08-09 MED ORDER — SODIUM CHLORIDE 0.9 % IV SOLN
25.0000 ug/h | INTRAVENOUS | Status: DC
  Administered 2014-08-09: 50 ug/h via INTRAVENOUS
  Administered 2014-08-09 (×2): 200 ug/h via INTRAVENOUS
  Administered 2014-08-10 – 2014-08-12 (×5): 400 ug/h via INTRAVENOUS
  Administered 2014-08-12 – 2014-08-13 (×2): 100 ug/h via INTRAVENOUS
  Filled 2014-08-09 (×14): qty 50

## 2014-08-09 MED ORDER — FENTANYL BOLUS VIA INFUSION
25.0000 ug | INTRAVENOUS | Status: DC | PRN
  Filled 2014-08-09: qty 25

## 2014-08-09 MED ORDER — FENTANYL CITRATE (PF) 100 MCG/2ML IJ SOLN
50.0000 ug | Freq: Once | INTRAMUSCULAR | Status: AC
  Administered 2014-08-09: 50 ug via INTRAVENOUS
  Filled 2014-08-09: qty 2

## 2014-08-09 NOTE — Progress Notes (Signed)
Initial Nutrition Assessment  DOCUMENTATION CODES:  Severe malnutrition in context of chronic illness, Underweight  INTERVENTION:  Initiate Vital AF 1.2 @ 25 ml/hr via PEG tube and increase by 10 ml every 4 hours to goal rate of 45 ml/hr.    Tube feeding regimen provides 1296 kcal (98% of needs), 81 grams of protein, and 875 ml of H2O.    NUTRITION DIAGNOSIS:  Malnutrition related to chronic illness as evidenced by severe depletion of body fat, severe depletion of muscle mass.   GOAL:  Patient will meet greater than or equal to 90% of their needs   MONITOR:  TF tolerance, Skin, I & O's, Vent status, Labs  REASON FOR ASSESSMENT:  Consult Enteral/tube feeding initiation and management  ASSESSMENT:  Pt with hx of severe COPD and recent hospitalization for aspiration. Pt had PEG placed 6/15 and started on TF. Pt admitted 6/25 for worsening SOB likely secondary to aspiration, placed on BiPAP did not improve and was intubated.   Patient is currently intubated on ventilator support MV: 8.5 L/min Temp (24hrs), Avg:97.8 F (36.6 C), Min:97.5 F (36.4 C), Max:98 F (36.7 C)  Per MD ok to start TF.  Per MD notes continue aggressive care for now.  Labs reviewed: Sodium low, CBGs: 75-124 Medications reviewed and include: thiamine  Noted pressure ulcer Nutrition-Focused physical exam completed. Findings are severe fat depletion, severe muscle depletion, and no edema.   Discussed with RN.  Height:  Ht Readings from Last 1 Encounters:  08/07/14 5\' 7"  (1.702 m)    Weight:  Wt Readings from Last 1 Encounters:  08/07/14 101 lb 3.2 oz (45.904 kg)    Ideal Body Weight:  67.2 kg  Wt Readings from Last 10 Encounters:  08/07/14 101 lb 3.2 oz (45.904 kg)  07/30/14 101 lb 6.6 oz (46 kg)  05/21/11 126 lb (57.153 kg)  04/23/11 131 lb (59.421 kg)  03/13/11 128 lb 9.6 oz (58.333 kg)    BMI:  Body mass index is 15.85 kg/(m^2).  Estimated Nutritional Needs:  Kcal:   1325  Protein:  70-85 grams  Fluid:  > 1.3 L/day  Skin:  Wound (see comment) (stage II to sacrum)  Diet Order:  Diet NPO time specified  EDUCATION NEEDS:  No education needs identified at this time   Intake/Output Summary (Last 24 hours) at 08/09/14 1236 Last data filed at 08/09/14 1000  Gross per 24 hour  Intake 588.92 ml  Output    700 ml  Net -111.08 ml    Last BM:  6/26  Kendell BaneHeather Shivam Mestas RD, LDN, CNSC [phone] Pager [phone] After Hours Pager

## 2014-08-09 NOTE — Progress Notes (Signed)
Echocardiogram 2D Echocardiogram has been performed.  Tye SavoyCasey N Dawsen Krieger 08/09/2014, 1:47 PM

## 2014-08-09 NOTE — Care Management Note (Signed)
Case Management Note  Patient Details  Name: Phillip May MRN: [id number] Date of Birth: 10-31-1945  Subjective/Objective:     resp distress, vent               Action/Plan: from nsg facility   Expected Discharge Date:                  Expected Discharge Plan:  Skilled Nursing Facility  In-House Referral:  Clinical Social Work  Discharge planning Services     Post Acute Care Choice:    Choice offered to:     DME Arranged:    DME Agency:     HH Arranged:    HH Agency:     Status of Service:     Medicare Important Message Given:    Date Medicare IM Given:    Medicare IM give by:    Date Additional Medicare IM Given:    Additional Medicare Important Message give by:     If discussed at Long Length of Stay Meetings, dates discussed:    Additional Comments: ur review done  Hanley Hays, RN 08/09/2014, 8:52 AM

## 2014-08-09 NOTE — Progress Notes (Signed)
Dr. Tyson Alias at bedside and made aware of SBP arterial reading in the 80's and no urinary output for this shift. MD orders to place foley catheter and to give 1L NS bolus over 1 hour. Orders implemented. Will continue to monitor patient.

## 2014-08-09 NOTE — Progress Notes (Signed)
PULMONARY / CRITICAL CARE MEDICINE   Name: Phillip May MRN: [id number] DOB: 07-25-1945    ADMISSION DATE:  08/07/2014  CHIEF COMPLAINT:  SOB  INITIAL PRESENTATION: 74 M with severe COPD, recent hospitalization for aspiration who was transferred to [facility] on 6/25 for worsening SOB likely 2/2 aspiration. In acute on chronic hypercarbic RF on arrival and placed on BiPAP; he did not improve and PCCM was consulted for further management.    STUDIES:  ABG 7.232/72.9/85 --> 7.175/80.2/76.0 after starting BiPAP Chest X-ray 6/25 -- > Resolving RLL opacity  SIGNIFICANT EVENTS: Intubation 08/08/2014  VITAL SIGNS: Temp:  [97.7 F (36.5 C)-98 F (36.7 C)] 97.7 F (36.5 C) (06/27 0700) Pulse Rate:  [71-101] 71 (06/27 0700) Resp:  [16-26] 18 (06/27 0700) BP: (81-127)/(53-88) 104/69 mmHg (06/27 0700) SpO2:  [94 %-100 %] 99 % (06/27 0840) Arterial Line BP: (92-141)/(42-68) 114/60 mmHg (06/27 0700) FiO2 (%):  [50 %-60 %] 50 % (06/27 0840) HEMODYNAMICS:   VENTILATOR SETTINGS: Vent Mode:  [-] PRVC FiO2 (%):  [50 %-60 %] 50 % Set Rate:  [20 bmp] 20 bmp Vt Set:  [400 mL] 400 mL PEEP:  [5 cmH20] 5 cmH20 Plateau Pressure:  [15 cmH20-16 cmH20] 15 cmH20 INTAKE / OUTPUT:  Intake/Output Summary (Last 24 hours) at 08/09/14 0939 Last data filed at 08/09/14 4098  Gross per 24 hour  Intake 538.92 ml  Output    700 ml  Net -161.08 ml    PHYSICAL EXAMINATION: General:  Elderly-appearing M in NAD, int agitation Neuro:  Opens eyes to name, not following commands HEENT:  Loss muscle mass Cardiovascular:  Tachycardic, RR, NS1/S2, (-) MRG Lungs:  coarse Abdomen:  S/NT/NT/(+)BS Musculoskeletal:  (-) C/C/E Skin:  Grossly Intact  LABS:  CBC  Recent Labs Lab 08/07/14 2034 08/08/14 0145 08/09/14 0400  WBC 15.8* 10.8* 13.0*  HGB 9.0* 8.9* 7.9*  HCT 26.1* 26.0* 22.2*  PLT 358 396 320   Coag's  Recent Labs Lab 08/08/14 0145  APTT 31  INR 1.06   BMET  Recent Labs Lab 08/08/14 1028  08/08/14 1500 08/09/14 0400  NA 129* 128* 130*  K 4.5 4.7 4.8  CL 94* 93* 94*  CO2 BUN 15 17 22*  CREATININE 0.89 0.91 1.22  GLUCOSE 134* 123* 102*   Electrolytes  Recent Labs Lab 08/08/14 0145  08/08/14 1028 08/08/14 1500 08/09/14 0400  CALCIUM 8.3*  < > 8.1* 7.9* 8.1*  MG 1.2*  --   --   --   --   PHOS 5.2*  --   --   --   --   < > = values in this interval not displayed. Sepsis Markers  Recent Labs Lab 08/07/14 2024 08/08/14 0145 08/08/14 0219 08/08/14 1500 08/09/14 0400  LATICACIDVEN 1.60  --  1.5 2.1*  --   PROCALCITON  --  1.86  --   --  30.74   ABG  Recent Labs Lab 08/08/14 0224 08/08/14 0505 08/09/14 0327  PHART 7.356 7.314* 7.350  PCO2ART 42.6 49.8* 51.2*  PO2ART 81.0 102* 92.0   Liver Enzymes  Recent Labs Lab 08/07/14 2034 08/08/14 0145 08/09/14 0400  AST 54* 36 34  ALT 35 29 23  ALKPHOS 93 84 72  BILITOT 0.4 0.5 0.5  ALBUMIN 2.4* 2.3* 2.1*   Cardiac Enzymes  Recent Labs Lab 08/08/14 0145 08/08/14 0718 08/08/14 1550  TROPONINI <0.03 <0.03 0.05*   Glucose  Recent Labs Lab 08/08/14 0806 08/08/14 1228 08/08/14 1753 08/08/14 2028  08/08/14 2332 08/09/14 0400  GLUCAP 94 119* 114* 124* 75 100*    Imaging Dg Chest Port 1 View  08/09/2014   CLINICAL DATA:  Pneumonia, history COPD, hypertension, smoker  EXAM: PORTABLE CHEST - 1 VIEW  COMPARISON:  Portable exam 0458 hours compared to 08/08/2014  FINDINGS: Rotated to the LEFT.  Tip of endotracheal tube projects 9.9 cm above carina.  Normal heart size, mediastinal contours and pulmonary vascularity.  BILATERAL lower lobe infiltrates.  Underlying emphysematous changes.  No gross pleural effusion or pneumothorax.  Diffuse osseous demineralization.  IMPRESSION: COPD changes with BILATERAL lower lobe infiltrates consistent with pneumonia.  High position of endotracheal tube; may advance tube 5 cm for more stable positioning.   Electronically Signed   By: Ulyses SouthwardMark  Boles M.D.   On:  08/09/2014 07:17   pcxr - improved aeration rt base, will adjust ETT  ASSESSMENT / PLAN:  PULMONARY OETT Post-intubation X-ray pending A:  Acute Hypoxic and Hypercarbic RF: favor aspiration given CT findings w/ dense bibasilar consolidation  Aspiration vs HCAP COPD ATX bases on CT P:   Lung protective ventilation VAP prevention SBT cpa 5 ps 5, goal 1hr Will discuss DNI status after planned extubation Prednisone taper fast Add mucomyst x 48hrs  Then allow to dc There is some improvement in aeration rt base  CARDIOVASCULAR CVL None A:  Elevated BNP: Not grossly fluid overload on exam. Unclear significance. P:  Echocardiogram pending  Tele Even goals No pressors  RENAL A:   Hyponatremia: Most consistent with hypovolemia vs. NDI. ATN? ARF P:   Cont NS resuscitation  I&O F/u chem with saline  GASTROINTESTINAL A:   Dysphagia and aspiration  P: tubefeeds ppi  HEMATOLOGIC A:   AOCD P:  Cbc am  dvt prevention  INFECTIOUS A:  Likely aspiration event P:   BCx2 6/25>>> UC:6/25>>> Sputum: 6/25>>> Abx:   Vanc 6/26>>>  Zosyn 6/26>>>  In am dc vanc is no mrsa Maintain current regimen  ENDOCRINE A:  Elevated blood glucose: Noted on prior admission labs   P:   SSI  NEUROLOGIC A:  Alcoholism Hx P: Cont Nutritional Supplementation PAD protocol fenrt  Needed wua  FAMILY  - Updates: Discussed current situation in depth with both son, [place], and daughter, Renda Rollsajah. At this point they want to pursue aggressive care but both note that patient didn't want "life support". They are discussing their ultimate goals of care.  - Inter-disciplinary family meet or Palliative Care meeting due by:  7/3    TODAY'S SUMMARY:  Ill call family today and discuss goals  Ccm time 30 min  Mcarthur RossettiDaniel J. Tyson AliasFeinstein, MD, FACP Pgr: [phone]  Pulmonary & Critical Care 08/09/2014 9:39 AM

## 2014-08-10 ENCOUNTER — Inpatient Hospital Stay (HOSPITAL_COMMUNITY): Payer: Medicare Other

## 2014-08-10 DIAGNOSIS — E871 Hypo-osmolality and hyponatremia: Secondary | ICD-10-CM

## 2014-08-10 DIAGNOSIS — Z789 Other specified health status: Secondary | ICD-10-CM

## 2014-08-10 DIAGNOSIS — J189 Pneumonia, unspecified organism: Secondary | ICD-10-CM

## 2014-08-10 LAB — GLUCOSE, CAPILLARY
GLUCOSE-CAPILLARY: 103 mg/dL — AB (ref 65–99)
GLUCOSE-CAPILLARY: 102 mg/dL — AB (ref 65–99)
GLUCOSE-CAPILLARY: 96 mg/dL (ref 65–99)
GLUCOSE-CAPILLARY: 126 mg/dL — AB (ref 65–99)
Glucose-Capillary: 108 mg/dL — ABNORMAL HIGH (ref 65–99)
Glucose-Capillary: 134 mg/dL — ABNORMAL HIGH (ref 65–99)
Glucose-Capillary: 86 mg/dL (ref 65–99)

## 2014-08-10 LAB — BASIC METABOLIC PANEL
Anion gap: 9 (ref 5–15)
BUN: 31 mg/dL — ABNORMAL HIGH (ref 6–20)
CALCIUM: 7.7 mg/dL — AB (ref 8.9–10.3)
CO2: 24 mmol/L (ref 22–32)
Chloride: 97 mmol/L — ABNORMAL LOW (ref 101–111)
Creatinine, Ser: 1.22 mg/dL (ref 0.61–1.24)
GFR calc Af Amer: >60 mL/min (ref >60)
GFR, EST NON AFRICAN AMERICAN: 59 mL/min — AB (ref >60)
GLUCOSE: 120 mg/dL — AB (ref 65–99)
Potassium: 3.8 mmol/L (ref 3.5–5.1)
Sodium: 130 mmol/L — ABNORMAL LOW (ref 135–145)

## 2014-08-10 LAB — PREPARE RBC (CROSSMATCH)

## 2014-08-10 LAB — PROCALCITONIN: Procalcitonin: 19.52 ng/mL

## 2014-08-10 LAB — CBC WITH DIFFERENTIAL/PLATELET
Basophils Absolute: 0 10*3/uL (ref 0.0–0.1)
Basophils Relative: 0 % (ref 0–1)
EOS PCT: 0 % (ref 0–5)
Eosinophils Absolute: 0 10*3/uL (ref 0.0–0.7)
HCT: 19.5 % — ABNORMAL LOW (ref 39.0–52.0)
HEMOGLOBIN: 6.8 g/dL — AB (ref 13.0–17.0)
LYMPHS ABS: 0.4 10*3/uL — AB (ref 0.7–4.0)
LYMPHS PCT: 4 % — AB (ref 12–46)
MCH: 32.4 pg (ref 26.0–34.0)
MCHC: 34.9 g/dL (ref 30.0–36.0)
MCV: 92.9 fL (ref 78.0–100.0)
MONO ABS: 0.4 10*3/uL (ref 0.1–1.0)
MONOS PCT: 4 % (ref 3–12)
NEUTROS PCT: 93 % — AB (ref 43–77)
Neutro Abs: 9.3 10*3/uL — ABNORMAL HIGH (ref 1.7–7.7)
Platelets: 289 10*3/uL (ref 150–400)
RBC: 2.1 MIL/uL — ABNORMAL LOW (ref 4.22–5.81)
RDW: 13.7 % (ref 11.5–15.5)
WBC: 10.1 10*3/uL (ref 4.0–10.5)

## 2014-08-10 LAB — ABO/RH: ABO/RH(D): B POS

## 2014-08-10 MED ORDER — MIDAZOLAM HCL 2 MG/2ML IJ SOLN
1.0000 mg | INTRAMUSCULAR | Status: DC | PRN
  Administered 2014-08-10 – 2014-08-12 (×12): 1 mg via INTRAVENOUS
  Filled 2014-08-10 (×12): qty 2

## 2014-08-10 MED ORDER — CEFTRIAXONE SODIUM IN DEXTROSE 20 MG/ML IV SOLN
1.0000 g | INTRAVENOUS | Status: AC
  Administered 2014-08-10 – 2014-08-15 (×6): 1 g via INTRAVENOUS
  Filled 2014-08-10 (×6): qty 50

## 2014-08-10 MED ORDER — FUROSEMIDE 10 MG/ML IJ SOLN
40.0000 mg | Freq: Two times a day (BID) | INTRAMUSCULAR | Status: DC
  Administered 2014-08-10 – 2014-08-11 (×3): 40 mg via INTRAVENOUS
  Filled 2014-08-10 (×3): qty 4

## 2014-08-10 MED ORDER — SODIUM CHLORIDE 0.9 % IV SOLN: Freq: Once | INTRAVENOUS | Status: DC

## 2014-08-10 NOTE — Progress Notes (Signed)
PULMONARY / CRITICAL CARE MEDICINE   Name: Francene CastleJames L Kleen MRN: [id number] DOB: [birth year]    ADMISSION DATE:  08/07/2014  CHIEF COMPLAINT:  SOB  INITIAL PRESENTATION: [age] M with severe COPD, recent hospitalization for aspiration who [facility] for worsening SOB likely 2/2 aspiration. In acute on chronic hypercarbic RF on arrival and placed on BiPAP; he did not improve and PCCM was consulted for further management.    STUDIES:  ABG 7.232/72.9/85 --> 7.175/80.2/76.0 after starting BiPAP Chest X-ray 6/25 -- > Resolving RLL opacity  SIGNIFICANT EVENTS: Intubation 08/08/2014 6/27- discussion about plan of care  VITAL SIGNS: Temp:  [97.5 F (36.4 C)-98.7 F (37.1 C)] 97.8 F (36.6 C) (06/28 0749) Pulse Rate:  [55-89] 83 (06/28 0843) Resp:  [17-27] 20 (06/28 0433) BP: (83-157)/(53-105) 132/68 mmHg (06/28 0843) SpO2:  [88 %-100 %] 100 % (06/28 0433) Arterial Line BP: (82-165)/(48-82) 126/64 mmHg (06/28 0400) FiO2 (%):  [40 %] 40 % (06/28 0843) Weight:  [50.7 kg (111 lb 12.4 oz)-51.2 kg (112 lb 14 oz)] 51.2 kg (112 lb 14 oz) (06/28 0400) HEMODYNAMICS:   VENTILATOR SETTINGS: Vent Mode:  [-] PRVC FiO2 (%):  [40 %] 40 % Set Rate:  [20 bmp] 20 bmp Vt Set:  [400 mL] 400 mL PEEP:  [5 cmH20] 5 cmH20 Plateau Pressure:  [18 cmH20-23 cmH20] 18 cmH20 INTAKE / OUTPUT:  Intake/Output Summary (Last 24 hours) at 08/10/14 0931 Last data filed at 08/10/14 0600  Gross per 24 hour  Intake   2514 ml  Output    675 ml  Net   1839 ml    PHYSICAL EXAMINATION: General:  Elderly-appearing M in NAD Neuro:  Opens eyes to name, following simple commands HEENT:  cachectic Cardiovascular:  RR, NS1/S2, (-) MRG Lungs:  Coarse bilat unchanged, reduced rt  Abdomen:  S/NT/NT/(+)BS Musculoskeletal:  (-) C/C/E Skin:  Grossly Intact  LABS:  CBC  Recent Labs Lab 08/08/14 0145 08/09/14 0400 08/10/14 0358  WBC 10.8* 13.0* 10.1  HGB 8.9* 7.9* 6.8*  HCT 26.0* 22.2* 19.5*  PLT  396 320 289   Coag's  Recent Labs Lab 08/08/14 0145  APTT 31  INR 1.06   BMET  Recent Labs Lab 08/08/14 1500 08/09/14 0400 08/10/14 0358  NA 128* 130* 130*  K 4.7 4.8 3.8  CL 93* 94* 97*  CO2 26 28 24   BUN 17 22* 31*  CREATININE 0.91 1.22 1.22  GLUCOSE 123* 102* 120*   Electrolytes  Recent Labs Lab 08/08/14 0145  08/08/14 1500 08/09/14 0400 08/10/14 0358  CALCIUM 8.3*  < > 7.9* 8.1* 7.7*  MG 1.2*  --   --   --   --   PHOS 5.2*  --   --   --   --   < > = values in this interval not displayed. Sepsis Markers  Recent Labs Lab 08/07/14 2024 08/08/14 0145 08/08/14 0219 08/08/14 1500 08/09/14 0400 08/10/14 0358  LATICACIDVEN 1.60  --  1.5 2.1*  --   --   PROCALCITON  --  1.86  --   --  30.74 19.52   ABG  Recent Labs Lab 08/08/14 0224 08/08/14 0505 08/09/14 0327  PHART 7.356 7.314* 7.350  PCO2ART 42.6 49.8* 51.2*  PO2ART 81.0 102* 92.0   Liver Enzymes  Recent Labs Lab 08/07/14 2034 08/08/14 0145 08/09/14 0400  AST 54* 36 34  ALT 35 29 23  ALKPHOS 93 84 72  BILITOT 0.4 0.5 0.5  ALBUMIN 2.4* 2.3*  2.1*   Cardiac Enzymes  Recent Labs Lab 08/08/14 0145 08/08/14 0718 08/08/14 1550  TROPONINI <0.03 <0.03 0.05*   Glucose  Recent Labs Lab 08/09/14 1149 08/09/14 1603 08/09/14 2004 08/09/14 2321 08/10/14 0352 08/10/14 0748  GLUCAP 76 90 103* 108* 134* 86    Imaging Dg Chest Port 1 View  08/10/2014   CLINICAL DATA:  Pneumonia  EXAM: PORTABLE CHEST - 1 VIEW  COMPARISON:  08/09/2014  FINDINGS: The endotracheal tube is 3.7 cm above the carina. Basilar airspace opacities persist bilaterally. There probably are small effusions. There is no pneumothorax.  IMPRESSION: Satisfactory ET tube position. Unchanged basilar airspace opacities and probable small effusions.   Electronically Signed   By: Ellery Plunk M.D.   On: 08/10/2014 05:13   pcxr - improved aeration rt base, will adjust ETT  ASSESSMENT / PLAN:  PULMONARY OETT  Post-intubation X-ray pending A:  Acute Hypoxic and Hypercarbic RF: favor aspiration given CT findings w/ dense bibasilar consolidation  Aspiration vs HCAP COPD ATX bases on CT P:   Lung protective ventilation Wean cpap 5 ps 5-10, failed, ps increase D/w daughter, consider comfort thur if not progressing  CARDIOVASCULAR CVL None A:  Elevated BNP: Not grossly fluid overload on exam. Unclear significance. P:   Tele Even goals No pressors, would not change outcome  RENAL A:   Hyponatremia: Most consistent with hypovolemia vs. NDI. ATN? ARF P:   kvo Chem in am  Consider lasix to neg balance otoday  GASTROINTESTINAL A:   Dysphagia and aspiration  Has PEG P: Tube feeds at goal ppi  HEMATOLOGIC A:   AOCD, diluted P:  Cbc am  dvt prevention lasix  INFECTIOUS A:  Likely aspiration event P:   BCx2 6/25>>> UC:6/25>>> Sputum: 6/25>>> Abx:   Vanc 6/26>>> 6/28 Zosyn 6/26>>>6/28 Ceftriaxone 6/28>>> Ceftriaxone start Dc vanc, zosyn  ENDOCRINE A:  Elevated blood glucose: Noted on prior admission labs   P:   SSI  NEUROLOGIC A:  Alcoholism Hx P: Cont Nutritional Supplementation PAD protocol fenrt  Needed wua  FAMILY  - Updates: Discussed current situation in depth with both son, [place], and daughter, Renda Rolls. At this point they want to pursue aggressive care but both note that patient didn't want "life support". They are discussing their ultimate goals of care.  - Inter-disciplinary family meet or Palliative Care meeting due by:  6/28 done, df, await response from son for dnr and dc vent threshold  Ccm time 30 min  Mcarthur Rossetti. Tyson Alias, MD, FACP Pgr: [phone] [place] [facility] 08/10/2014 9:31 AM

## 2014-08-10 NOTE — Progress Notes (Signed)
eLink Physician-Brief Progress Note Patient Name: Phillip May DOB: 1945/03/23 MRN: [id number]   Date of Service  08/10/2014  HPI/Events of Note  Hb 6.8  eICU Interventions  1 U PRBC     Intervention Category Intermediate Interventions: Bleeding - evaluation and treatment with blood products  [name] V. 08/10/2014, 4:38 AM

## 2014-08-10 NOTE — Clinical Documentation Improvement (Signed)
  Per 6/27 eval by Registered Dietician "Severe protein calorie malnutrition in context of chronic illness, Underweight" and "evidenced by severe depletion of body fat, severe depletion of muscle mass" with BMI 15.85. If you agree with this eval please document in PN to illustrate severity of illness and risk of mortality.   . Severity: --Mild (first degree) --Moderate (second degree) --Severe (third degree) . Avoid documenting a range of severity, such as "moderate to severe" . Form: --Kwashiorkor (rarely seen in the [place] --Marasmus --Marasmic kwashiorkor --Other  Thank Harle BattiestYou, Ethne Jeon RN CDI [phone] HIM department

## 2014-08-10 NOTE — Care Management (Signed)
Important Message  Patient Details  Name: Phillip May MRN: [id number] Date of Birth: October 25, 1945   Medicare Important Message Given:  Yes-second notification given    Yvonna Alanisndra M Robinson 08/10/2014, 7:59 AM

## 2014-08-11 ENCOUNTER — Inpatient Hospital Stay (HOSPITAL_COMMUNITY): Payer: Medicare Other

## 2014-08-11 LAB — BASIC METABOLIC PANEL
Anion gap: 12 (ref 5–15)
BUN: 30 mg/dL — AB (ref 6–20)
CALCIUM: 8.2 mg/dL — AB (ref 8.9–10.3)
CO2: 27 mmol/L (ref 22–32)
CREATININE: 1.08 mg/dL (ref 0.61–1.24)
Chloride: 95 mmol/L — ABNORMAL LOW (ref 101–111)
GFR calc non Af Amer: >60 mL/min (ref >60)
Glucose, Bld: 125 mg/dL — ABNORMAL HIGH (ref 65–99)
Potassium: 3.7 mmol/L (ref 3.5–5.1)
Sodium: 134 mmol/L — ABNORMAL LOW (ref 135–145)

## 2014-08-11 LAB — GLUCOSE, CAPILLARY
GLUCOSE-CAPILLARY: 86 mg/dL (ref 65–99)
GLUCOSE-CAPILLARY: 108 mg/dL — AB (ref 65–99)
Glucose-Capillary: 122 mg/dL — ABNORMAL HIGH (ref 65–99)
Glucose-Capillary: 98 mg/dL (ref 65–99)
Glucose-Capillary: 191 mg/dL — ABNORMAL HIGH (ref 65–99)
Glucose-Capillary: 180 mg/dL — ABNORMAL HIGH (ref 65–99)

## 2014-08-11 LAB — TYPE AND SCREEN
ABO/RH(D): B POS
Antibody Screen: NEGATIVE
UNIT DIVISION: 0

## 2014-08-11 LAB — MAGNESIUM: Magnesium: 1.8 mg/dL (ref 1.7–2.4)

## 2014-08-11 LAB — PHOSPHORUS: PHOSPHORUS: 3.5 mg/dL (ref 2.5–4.6)

## 2014-08-11 LAB — CULTURE, RESPIRATORY

## 2014-08-11 LAB — CBC
HEMATOCRIT: 26.1 % — AB (ref 39.0–52.0)
Hemoglobin: 8.9 g/dL — ABNORMAL LOW (ref 13.0–17.0)
MCH: 31.3 pg (ref 26.0–34.0)
MCHC: 34.1 g/dL (ref 30.0–36.0)
MCV: 91.9 fL (ref 78.0–100.0)
Platelets: 324 10*3/uL (ref 150–400)
RBC: 2.84 MIL/uL — ABNORMAL LOW (ref 4.22–5.81)
RDW: 15.3 % (ref 11.5–15.5)
WBC: 10 10*3/uL (ref 4.0–10.5)

## 2014-08-11 LAB — CULTURE, RESPIRATORY W GRAM STAIN

## 2014-08-11 MED ORDER — MAGNESIUM SULFATE 2 GM/50ML IV SOLN
2.0000 g | Freq: Once | INTRAVENOUS | Status: AC
  Administered 2014-08-11: 2 g via INTRAVENOUS
  Filled 2014-08-11: qty 50

## 2014-08-11 MED ORDER — FUROSEMIDE 10 MG/ML IJ SOLN
40.0000 mg | Freq: Two times a day (BID) | INTRAMUSCULAR | Status: DC
  Administered 2014-08-11 – 2014-08-12 (×2): 40 mg via INTRAVENOUS
  Filled 2014-08-11 (×4): qty 4

## 2014-08-11 MED ORDER — POTASSIUM CHLORIDE 20 MEQ/15ML (10%) PO SOLN
20.0000 meq | ORAL | Status: AC
  Administered 2014-08-11 (×2): 20 meq
  Filled 2014-08-11 (×2): qty 15

## 2014-08-11 MED ORDER — RISPERIDONE 0.5 MG PO TABS
0.5000 mg | ORAL_TABLET | Freq: Two times a day (BID) | ORAL | Status: DC
  Administered 2014-08-11 – 2014-08-13 (×5): 0.5 mg via ORAL
  Filled 2014-08-11 (×6): qty 1

## 2014-08-11 MED ORDER — POLYETHYLENE GLYCOL 3350 17 G PO PACK
17.0000 g | PACK | Freq: Every day | ORAL | Status: DC
  Administered 2014-08-11 – 2014-08-18 (×3): 17 g via ORAL
  Filled 2014-08-11 (×10): qty 1

## 2014-08-11 NOTE — Progress Notes (Signed)
Upon entering room patient appeared to have pulled ETT tube out 2-3 cm.  RT at bedside to reposition tube and stat chest xray ordered to check placement. Patient continues to be very agitated, restless, pulling at lines/tubing. Patient is on max fentanyl gtt and safety mittens ineffective. RT and 2 RN's at bedside. Dr. Tyson AliasFeinstein made aware. New orders given for soft bilateral wrist restraints. Will continue to monitor.

## 2014-08-11 NOTE — Progress Notes (Signed)
PULMONARY / CRITICAL CARE MEDICINE   Name: Francene CastleJames L Lacorte MRN: [id number] DOB: 09-21-1945    ADMISSION DATE:  08/07/2014  CHIEF COMPLAINT:  SOB  INITIAL PRESENTATION: [age] M with severe COPD, recent hospitalization for aspiration who was [facility] for worsening SOB likely 2/2 aspiration. In acute on chronic hypercarbic RF on arrival and placed on BiPAP; he did not improve and PCCM was consulted for further management.    STUDIES:  ABG 7.232/72.9/85 --> 7.175/80.2/76.0 after starting BiPAP Chest X-ray 6/25 -- > Resolving RLL opacity  SIGNIFICANT EVENTS: Intubation 08/08/2014 6/27- discussion about plan of care 6/28- d/w son- wants full code up until maximal therapy given, abx duration then dnr and extubation then after further discussions. If coded, they would want one shot at resus then dnr  VITAL SIGNS: Temp:  [97.2 F (36.2 C)-98.3 F (36.8 C)] 97.2 F (36.2 C) (06/29 0803) Pulse Rate:  [25-90] 86 (06/29 0803) Resp:  [12-24] 16 (06/29 0803) BP: (100-169)/(57-117) 143/71 mmHg (06/29 0803) SpO2:  [99 %-100 %] 100 % (06/29 0803) Arterial Line BP: (105-171)/(61-75) 171/75 mmHg (06/28 1600) FiO2 (%):  [40 %] 40 % (06/29 0737) Weight:  [49.3 kg (108 lb 11 oz)] 49.3 kg (108 lb 11 oz) (06/29 0442) HEMODYNAMICS:   VENTILATOR SETTINGS: Vent Mode:  [-] PRVC FiO2 (%):  [40 %] 40 % Set Rate:  [20 bmp] 20 bmp Vt Set:  [400 mL] 400 mL PEEP:  [5 cmH20] 5 cmH20 Plateau Pressure:  [17 cmH20-25 cmH20] 25 cmH20 INTAKE / OUTPUT:  Intake/Output Summary (Last 24 hours) at 08/11/14 1100 Last data filed at 08/11/14 0930  Gross per 24 hour  Intake   2390 ml  Output   2900 ml  Net   -510 ml    PHYSICAL EXAMINATION: General:  Elderly-appearing M in NAD Neuro:  Opens eyes to name, following simple commands HEENT:  cachectic Cardiovascular:  RR, NS1/S2 Lungs:  Coarse distant  Abdomen:  S/NT/NT/(+)BS Musculoskeletal:  No edema Skin: no rash  LABS:  CBC  Recent  Labs Lab 08/09/14 0400 08/10/14 0358 08/11/14 0750  WBC 13.0* 10.1 10.0  HGB 7.9* 6.8* 8.9*  HCT 22.2* 19.5* 26.1*  PLT 320 289 324   Coag's  Recent Labs Lab 08/08/14 0145  APTT 31  INR 1.06   BMET  Recent Labs Lab 08/09/14 0400 08/10/14 0358 08/11/14 0303  NA 130* 130* 134*  K 4.8 3.8 3.7  CL 94* 97* 95*  CO2 28 24 27   BUN 22* 31* 30*  CREATININE 1.22 1.22 1.08  GLUCOSE 102* 120* 125*   Electrolytes  Recent Labs Lab 08/08/14 0145  08/09/14 0400 08/10/14 0358 08/11/14 0303  CALCIUM 8.3*  < > 8.1* 7.7* 8.2*  MG 1.2*  --   --   --  1.8  PHOS 5.2*  --   --   --  3.5  < > = values in this interval not displayed. Sepsis Markers  Recent Labs Lab 08/07/14 2024 08/08/14 0145 08/08/14 0219 08/08/14 1500 08/09/14 0400 08/10/14 0358  LATICACIDVEN 1.60  --  1.5 2.1*  --   --   PROCALCITON  --  1.86  --   --  30.74 19.52   ABG  Recent Labs Lab 08/08/14 0224 08/08/14 0505 08/09/14 0327  PHART 7.356 7.314* 7.350  PCO2ART 42.6 49.8* 51.2*  PO2ART 81.0 102* 92.0   Liver Enzymes  Recent Labs Lab 08/07/14 2034 08/08/14 0145 08/09/14 0400  AST 54* 36 34  ALT 35  29 23  ALKPHOS 93 84 72  BILITOT 0.4 0.5 0.5  ALBUMIN 2.4* 2.3* 2.1*   Cardiac Enzymes  Recent Labs Lab 08/08/14 0145 08/08/14 0718 08/08/14 1550  TROPONINI <0.03 <0.03 0.05*   Glucose  Recent Labs Lab 08/10/14 1159 08/10/14 1630 08/10/14 2016 08/11/14 0046 08/11/14 0449 08/11/14 0800  GLUCAP 102* 96 126* 122* 86 98    Imaging Dg Chest Port 1 View  08/11/2014   CLINICAL DATA:  Hypoxia  EXAM: PORTABLE CHEST - 1 VIEW  COMPARISON:  August 10, 2014  FINDINGS: Endotracheal tube tip is 3.9 cm above the carina. No pneumothorax. There is a right pleural effusion. There is patchy atelectasis in the lung bases. Lungs elsewhere clear. The heart is upper normal in size with pulmonary vascularity within normal limits. Aorta is tortuous. No adenopathy.  IMPRESSION: Endotracheal tube as  described. No pneumothorax. Persistent right pleural effusion with patchy bibasilar atelectasis. No new parenchymal lung opacity. No change in cardiac silhouette.   Electronically Signed   By: Bretta Bang III M.D.   On: 08/11/2014 08:03   pcxr - improved aeration rt base, will adjust ETT  ASSESSMENT / PLAN:  PULMONARY OETT Post-intubation X-ray pending A:  Acute Hypoxic and Hypercarbic RF: favor aspiration given CT findings w/ dense bibasilar consolidation  Aspiration vs HCAP COPD ATX bases on CT P:   Lung protective ventilation Wean cpap 5 ps 5-10, failed yet again fast, PS to 15-20 attempts D/w daughter and son, see above, need to call back again , he is suffering and no beenfit now from vent No role tap thora Ad lasix  CARDIOVASCULAR CVL None A:  Elevated BNP: Not grossly fluid overload on exam. Unclear significance. P:   Tele Even goals  RENAL A:   Hyponatremia: Most consistent with hypovolemia slow improved hypmag P:   kvo Chem in am  Consider lasix today to neg 1 liter Mag supp  GASTROINTESTINAL A:   Dysphagia and aspiration  Has PEG P: Tube feeds at goal ppi Assess last BM, add mirilax  HEMATOLOGIC A:   AOCD, diluted S/p Tx No bleeding noted P:  Cbc am  dvt prevention Lasix addition to neg balance  INFECTIOUS A:  Likely aspiration event P:   BCx2 6/25>>> UC:6/25>>> Sputum: 6/25>>> Abx:   Vanc 6/26>>> 6/28 Zosyn 6/26>>>6/28 Ceftriaxone 6/28>>>  Add stop date 8 days  ENDOCRINE A:  Elevated blood glucose: Noted on prior admission labs   P:   SSI  NEUROLOGIC A:  Alcoholism Hx P: Cont Nutritional Supplementation PAD protocol fenrt  Needed wua  FAMILY  - Updates: son and duaghter sperate, need to cal back and reconsider earlier extubation for comfort  - Inter-disciplinary family meet or Palliative Care meeting due by:  6/28 done Ccm time 30 min  Mcarthur Rossetti. Tyson Alias, MD, FACP Pgr: [phone] [place] [facility] 08/11/2014 11:00 AM

## 2014-08-11 NOTE — Clinical Social Work Note (Signed)
CS[facility] CSW reviewed notes and patient is currently on a lung protection ventilation and per attending MD's note on 6/29 more discussion will be had with family as patient is suffering and no benefit now from vent. CSW will continue to monitor patient's progress and talk with family regarding appropriate discharge plans.  Genelle BalVanessa Khayman Kirsch, MSW, LCSW Licensed Clinical Social Worker Clinical Social Work Department [facility] [phone]

## 2014-08-11 NOTE — Progress Notes (Signed)
[facility] FOR AM LAB REPLACEMENT ONLY  The patient does apply for the [facility] Electrolyte Replac[facility] Protocol based on the criteria listed below:   1. Is GFR >/= 40 ml/min? Yes.    Patient's GFR today is >60 2. Is urine output >/= 0.5 ml/kg/hr for the last 6 hours? Yes.   Patient's UOP is 1.5 ml/kg/hr 3. Is BUN < 60 mg/dL? Yes.    Patient's BUN today is 30 4. Abnormal electrolyte(s): Potassium 3.7, Magnesium 1.8 5. Ordered repletion with: [facility] adult ICU replacement protocol 6. If a panic level lab has been reported, has the CCM MD in charge been notified? Yes.  .   Physician:  Dr. Loletha GrayerSteven Sommer  [facility] CampusRAMZAH, Phillip BertholdYOUNKAI May 08/11/2014 6:12 AM

## 2014-08-12 ENCOUNTER — Inpatient Hospital Stay (HOSPITAL_COMMUNITY): Payer: Medicare Other

## 2014-08-12 DIAGNOSIS — E46 Unspecified protein-calorie malnutrition: Secondary | ICD-10-CM

## 2014-08-12 LAB — CULTURE, BLOOD (ROUTINE X 2)
CULTURE: NO GROWTH
Culture: NO GROWTH

## 2014-08-12 LAB — BASIC METABOLIC PANEL
Anion gap: 7 (ref 5–15)
BUN: 28 mg/dL — ABNORMAL HIGH (ref 6–20)
CO2: 31 mmol/L (ref 22–32)
CREATININE: 0.72 mg/dL (ref 0.61–1.24)
Calcium: 8.7 mg/dL — ABNORMAL LOW (ref 8.9–10.3)
Chloride: 98 mmol/L — ABNORMAL LOW (ref 101–111)
GFR calc Af Amer: >60 mL/min (ref >60)
GFR calc non Af Amer: >60 mL/min (ref >60)
Glucose, Bld: 140 mg/dL — ABNORMAL HIGH (ref 65–99)
Potassium: 4.1 mmol/L (ref 3.5–5.1)
SODIUM: 136 mmol/L (ref 135–145)

## 2014-08-12 LAB — CBC
HEMATOCRIT: 29.6 % — AB (ref 39.0–52.0)
HEMOGLOBIN: 9.9 g/dL — AB (ref 13.0–17.0)
MCH: 30.9 pg (ref 26.0–34.0)
MCHC: 33.4 g/dL (ref 30.0–36.0)
MCV: 92.5 fL (ref 78.0–100.0)
Platelets: 332 10*3/uL (ref 150–400)
RBC: 3.2 MIL/uL — AB (ref 4.22–5.81)
RDW: 14.9 % (ref 11.5–15.5)
WBC: 6.6 10*3/uL (ref 4.0–10.5)

## 2014-08-12 LAB — GLUCOSE, CAPILLARY
GLUCOSE-CAPILLARY: 126 mg/dL — AB (ref 65–99)
GLUCOSE-CAPILLARY: 151 mg/dL — AB (ref 65–99)
GLUCOSE-CAPILLARY: 151 mg/dL — AB (ref 65–99)
GLUCOSE-CAPILLARY: 94 mg/dL (ref 65–99)
Glucose-Capillary: 135 mg/dL — ABNORMAL HIGH (ref 65–99)
Glucose-Capillary: 138 mg/dL — ABNORMAL HIGH (ref 65–99)

## 2014-08-12 LAB — MAGNESIUM: MAGNESIUM: 1.9 mg/dL (ref 1.7–2.4)

## 2014-08-12 LAB — PHOSPHORUS: Phosphorus: 2.2 mg/dL — ABNORMAL LOW (ref 2.5–4.6)

## 2014-08-12 MED ORDER — DEXMEDETOMIDINE HCL IN NACL 200 MCG/50ML IV SOLN
0.2000 ug/kg/h | INTRAVENOUS | Status: DC
  Administered 2014-08-12: 0.7 ug/kg/h via INTRAVENOUS
  Administered 2014-08-12: 0.4 ug/kg/h via INTRAVENOUS
  Administered 2014-08-13 (×2): 0.7 ug/kg/h via INTRAVENOUS
  Administered 2014-08-13: 0.4 ug/kg/h via INTRAVENOUS
  Administered 2014-08-13: 0.7 ug/kg/h via INTRAVENOUS
  Administered 2014-08-14: 0.3 ug/kg/h via INTRAVENOUS
  Filled 2014-08-12 (×6): qty 50

## 2014-08-12 MED ORDER — HEPARIN SODIUM (PORCINE) 5000 UNIT/ML IJ SOLN
5000.0000 [IU] | Freq: Three times a day (TID) | INTRAMUSCULAR | Status: DC
  Administered 2014-08-12 – 2014-08-19 (×21): 5000 [IU] via SUBCUTANEOUS
  Filled 2014-08-12 (×24): qty 1

## 2014-08-12 MED ORDER — HYDRALAZINE HCL 20 MG/ML IJ SOLN
10.0000 mg | INTRAMUSCULAR | Status: DC | PRN
  Administered 2014-08-12 – 2014-08-13 (×3): 10 mg via INTRAVENOUS
  Filled 2014-08-12 (×2): qty 1

## 2014-08-12 MED ORDER — FREE WATER
100.0000 mL | Freq: Three times a day (TID) | Status: DC
  Administered 2014-08-12 – 2014-08-20 (×24): 100 mL

## 2014-08-12 MED ORDER — ACETAMINOPHEN 325 MG PO TABS: 650.0000 mg | ORAL_TABLET | Freq: Four times a day (QID) | ORAL | Status: DC | PRN

## 2014-08-12 MED ORDER — NEBIVOLOL HCL 10 MG PO TABS
10.0000 mg | ORAL_TABLET | Freq: Every day | ORAL | Status: DC
  Administered 2014-08-13 – 2014-08-19 (×6): 10 mg via JEJUNOSTOMY
  Filled 2014-08-12 (×8): qty 1

## 2014-08-12 MED ORDER — FAMOTIDINE 40 MG/5ML PO SUSR
20.0000 mg | Freq: Two times a day (BID) | ORAL | Status: DC
  Administered 2014-08-12 – 2014-08-19 (×16): 20 mg
  Filled 2014-08-12 (×19): qty 2.5

## 2014-08-12 MED ORDER — HALOPERIDOL LACTATE 5 MG/ML IJ SOLN
2.5000 mg | INTRAMUSCULAR | Status: DC | PRN
  Administered 2014-08-12: 2.5 mg via INTRAVENOUS
  Filled 2014-08-12: qty 1

## 2014-08-12 NOTE — Progress Notes (Signed)
Patient very agitated again this morning. Unable to follow commands during wake up assessment. BP elevated. Dr. Tyson AliasFeinstein aware, orders to renew soft wrist restraints and Haldol PRN.

## 2014-08-12 NOTE — Progress Notes (Signed)
Pt continues to work ETT tube a little at a time with his tongue which cause position changes.  Pt ett tube found at 24lip repositioned at 25 lip will continue to monitor

## 2014-08-12 NOTE — Progress Notes (Signed)
eLink Physician-Brief Progress Note Patient Name: Francene CastleJames L Hsu DOB: 12-02-1945 MRN: [id number]  Date of Service  08/12/2014   HPI/Events of Note   sbp 180, diast 103, map 135, HR 60s on precedex  eICU Interventions  Hydralazine prn for sbp < 160   Intervention Category Intermediate Interventions: hypertension  Calloway Andrus 08/12/2014, 8:47 PM

## 2014-08-12 NOTE — Care Management (Signed)
Important Message  Patient Details  Name: Francene CastleJames L Ahr MRN: [id number] Date of Birth: 1945/05/16   Medicare Important Message Given:  Yes-third notification given    Yvonna Alanisndra M Robinson 08/12/2014, 2:29 PM

## 2014-08-12 NOTE — Progress Notes (Signed)
eLink Physician-Brief Progress Note Patient Name: Francene CastleJames L Mancera DOB: 1945/08/22 MRN: [id number]   Date of Service  08/12/2014  HPI/Events of Note  Et tube ewas readjusted. Repeat cxr reviewed - et tube in good position  eICU Interventions  monitor     Intervention Category Intermediate Interventions: Diagnostic test evaluation  Charlie Char 08/12/2014, 7:19 PM

## 2014-08-12 NOTE — Progress Notes (Signed)
PULMONARY / CRITICAL CARE MEDICINE   Name: Phillip May MRN: [id number] DOB: 1945-08-17    ADMISSION DATE:  08/07/2014  CHIEF COMPLAINT:  SOB  INITIAL PRESENTATION: 53 M with severe COPD, recent hospitalization for aspiration who was transferred to [facility] on 6/25 for worsening SOB likely 2/2 aspiration. In acute on chronic hypercarbic RF on arrival and placed on BiPAP; he did not improve and PCCM was consulted for further management.    STUDIES:  ABG 7.232/72.9/85 --> 7.175/80.2/76.0 after starting BiPAP Chest X-ray 6/25 -- > Resolving RLL opacity  SIGNIFICANT EVENTS: Intubation 08/08/2014 6/27- discussion about plan of care 6/28- d/w son- wants full code up until maximal therapy given, abx duration then dnr and extubation then after further discussions. If coded, they would want one shot at resus then dnr 6/30 Not weanable. CXR gradually improving. Further discussion of goals of care with son. Recommended continued support for time limit (into early to mid part of next week) with one way extubation when status dictates and DNR  SUBJECTIVE: RASS -3. + F/C. Not weanable  VITAL SIGNS: Temp:  [97.8 F (36.6 C)-98.5 F (36.9 C)] 97.8 F (36.6 C) (06/30 1659) Pulse Rate:  [26-89] 26 (06/30 1700) Resp:  [16-23] 20 (06/30 1700) BP: (107-233)/(71-160) 151/99 mmHg (06/30 1700) SpO2:  [76 %-100 %] 76 % (06/30 1700) FiO2 (%):  [40 %] 40 % (06/30 1557) Weight:  [48.7 kg (107 lb 5.8 oz)] 48.7 kg (107 lb 5.8 oz) (06/30 0351) HEMODYNAMICS:   VENTILATOR SETTINGS: Vent Mode:  [-] PRVC FiO2 (%):  [40 %] 40 % Set Rate:  [20 bmp] 20 bmp Vt Set:  [400 mL] 400 mL PEEP:  [5 cmH20] 5 cmH20 Plateau Pressure:  [16 cmH20-19 cmH20] 16 cmH20 INTAKE / OUTPUT:  Intake/Output Summary (Last 24 hours) at 08/12/14 1803 Last data filed at 08/12/14 1754  Gross per 24 hour  Intake 2346.04 ml  Output   2495 ml  Net -148.96 ml    PHYSICAL EXAMINATION: General: Cachectic, RASS -3, + F/C Neuro:  MAEs HEENT: temporal wasting Cardiovascular:  freq extrasystoles Lungs: diminished throughout, no wheezes, prolonged exp phase Abdomen: scaphoid, NT, G tube site clean Ext: warm, no edema  LABS:  CBC  Recent Labs Lab 08/10/14 0358 08/11/14 0750 08/12/14 0318  WBC 10.1 10.0 6.6  HGB 6.8* 8.9* 9.9*  HCT 19.5* 26.1* 29.6*  PLT 289 324 332   Coag's  Recent Labs Lab 08/08/14 0145  APTT 31  INR 1.06   BMET  Recent Labs Lab 08/10/14 0358 08/11/14 0303 08/12/14 0318  NA 130* 134* 136  K 3.8 3.7 4.1  CL 97* 95* 98*  CO2 BUN 31* 30* 28*  CREATININE 1.22 1.08 0.72  GLUCOSE 120* 125* 140*   Electrolytes  Recent Labs Lab 08/08/14 0145  08/10/14 0358 08/11/14 0303 08/12/14 0318  CALCIUM 8.3*  < > 7.7* 8.2* 8.7*  MG 1.2*  --   --  1.8 1.9  PHOS 5.2*  --   --  3.5 2.2*  < > = values in this interval not displayed. Sepsis Markers  Recent Labs Lab 08/07/14 2024 08/08/14 0145 08/08/14 0219 08/08/14 1500 08/09/14 0400 08/10/14 0358  LATICACIDVEN 1.60  --  1.5 2.1*  --   --   PROCALCITON  --  1.86  --   --  30.74 19.52   ABG  Recent Labs Lab 08/08/14 0224 08/08/14 0505 08/09/14 0327  PHART 7.356 7.314* 7.350  PCO2ART 42.6 49.8* 51.2*  PO2ART 81.0 102* 92.0   Liver Enzymes  Recent Labs Lab 08/07/14 2034 08/08/14 0145 08/09/14 0400  AST 54* 36 34  ALT 35 29 23  ALKPHOS 93 84 72  BILITOT 0.4 0.5 0.5  ALBUMIN 2.4* 2.3* 2.1*   Cardiac Enzymes  Recent Labs Lab 08/08/14 0145 08/08/14 0718 08/08/14 1550  TROPONINI <0.03 <0.03 0.05*   Glucose  Recent Labs Lab 08/11/14 2014 08/11/14 2336 08/12/14 0350 08/12/14 0838 08/12/14 1209 08/12/14 1702  GLUCAP 180* 151* 135* 94 126* 151*    Imaging Dg Chest Port 1 View  08/12/2014   CLINICAL DATA:  Followup exam.  Intubated patient.  EXAM: PORTABLE CHEST - 1 VIEW  COMPARISON:  08/12/2014 at 4:54 a.m.  FINDINGS: Endotracheal tube tip projects 3.5 cm above the carina.  Hazy opacity  at the right lung base consistent with combination of atelectasis and pleural fluid is similar to the prior exam. Opacity at the left lung base has improved from prior studies, but is similar to the most recent exam, likely also atelectasis. No pulmonary edema. No pneumothorax.  Cardiac silhouette is normal in size.  Aorta is uncoiled.  IMPRESSION: 1. No significant change from the earlier chest radiograph allowing for differences in patient positioning and technique. 2. Endotracheal tube is well positioned. 3. Right greater than left lung base opacity is stable   Electronically Signed   By: Amie Portlandavid  Ormond M.D.   On: 08/12/2014 17:57   Dg Chest Port 1 View  08/12/2014   CLINICAL DATA:  Respiratory failure.  Endotracheal intubation.  EXAM: PORTABLE CHEST - 1 VIEW  COMPARISON:  08/11/2014  FINDINGS: The endotracheal tube is 4.6 cm above the carina. There is persistent basilar opacity, right greater than left, unchanged. There is tortuosity and ectasia of the aorta, unchanged. There is no large effusion. There is no pneumothorax  IMPRESSION: No significant interval change in the bilateral airspace opacities.  Support equipment appears satisfactorily positioned.   Electronically Signed   By: Ellery Plunkaniel R Mitchell M.D.   On: 08/12/2014 06:30     ASSESSMENT / PLAN:  PULMONARY A:  Acute hypoxic, hypercarbic resp failure BLL consolidation Severe COPD (FEV1 < 1 liter in 2013) Poor candidate for trach tube and long term vent P:   Cont full vent support - settings reviewed and/or adjusted Cont vent bundle Daily SBT if/when meets criteria  CARDIOVASCULAR A:  Freq PACs P:   Increase nebivolol 6/30  RENAL A:   Hyponatremia, resolved AKI, resolved P:   Monitor BMET intermittently Monitor I/Os Correct electrolytes as indicated  GASTROINTESTINAL A:   Chronic dysphagia and aspiration  G tube present Severe protein-calorie malnutrition P: SUP: enteral famotidine Cont TFs  HEMATOLOGIC A:    Chronic anemia - chronic disease +/- nutritional deficiency P:  DVT px: SQ heparin Monitor CBC intermittently Transfuse per usual ICU guidelines   INFECTIOUS A:   Severe BLL PNA, NOS P:   BCx2 6/25>>> NEG UC:6/25>>>NEG Sputum: 6/25>> NOF Abx:   Vanc 6/26>>> 6/28 Zosyn 6/26>>>6/28 Ceftriaxone 6/28>>>  Add stop date 8 days  ENDOCRINE A:   Hyperglycemia, mild P:   DC SSI 6/30 Change CBGs to q 8 hrs Resume SSI for glu > 180  NEUROLOGIC A:   H/O EtOH abuse Very high sedation/analgesia reqts Severe baseline deconditioning P: Cont fent gtt Cont daily WUA Cont goals of care/EOL discussions  CCM: 50 mins inc 20 mins of goals of care conf with son and cousins  08/12/2014 6:03 PM

## 2014-08-13 ENCOUNTER — Inpatient Hospital Stay (HOSPITAL_COMMUNITY): Payer: Medicare Other

## 2014-08-13 DIAGNOSIS — J9600 Acute respiratory failure, unspecified whether with hypoxia or hypercapnia: Secondary | ICD-10-CM

## 2014-08-13 HISTORY — DX: Acute respiratory failure, unspecified whether with hypoxia or hypercapnia: J96.00

## 2014-08-13 LAB — CBC
HEMATOCRIT: 29 % — AB (ref 39.0–52.0)
Hemoglobin: 9.9 g/dL — ABNORMAL LOW (ref 13.0–17.0)
MCH: 31.3 pg (ref 26.0–34.0)
MCHC: 34.1 g/dL (ref 30.0–36.0)
MCV: 91.8 fL (ref 78.0–100.0)
PLATELETS: 305 10*3/uL (ref 150–400)
RBC: 3.16 MIL/uL — AB (ref 4.22–5.81)
RDW: 14.3 % (ref 11.5–15.5)
WBC: 6.3 10*3/uL (ref 4.0–10.5)

## 2014-08-13 LAB — COMPREHENSIVE METABOLIC PANEL
ALT: 25 U/L (ref 17–63)
ANION GAP: 9 (ref 5–15)
AST: 28 U/L (ref 15–41)
Albumin: 2.1 g/dL — ABNORMAL LOW (ref 3.5–5.0)
Alkaline Phosphatase: 58 U/L (ref 38–126)
BUN: 25 mg/dL — ABNORMAL HIGH (ref 6–20)
CHLORIDE: 97 mmol/L — AB (ref 101–111)
CO2: 30 mmol/L (ref 22–32)
Calcium: 8.8 mg/dL — ABNORMAL LOW (ref 8.9–10.3)
Creatinine, Ser: 0.61 mg/dL (ref 0.61–1.24)
GFR calc Af Amer: >60 mL/min (ref >60)
GFR calc non Af Amer: >60 mL/min (ref >60)
GLUCOSE: 148 mg/dL — AB (ref 65–99)
Potassium: 3.8 mmol/L (ref 3.5–5.1)
SODIUM: 136 mmol/L (ref 135–145)
Total Bilirubin: 0.5 mg/dL (ref 0.3–1.2)
Total Protein: 6.2 g/dL — ABNORMAL LOW (ref 6.5–8.1)

## 2014-08-13 LAB — PROCALCITONIN: PROCALCITONIN: 1.26 ng/mL

## 2014-08-13 LAB — GLUCOSE, CAPILLARY
GLUCOSE-CAPILLARY: 113 mg/dL — AB (ref 65–99)
Glucose-Capillary: 118 mg/dL — ABNORMAL HIGH (ref 65–99)

## 2014-08-13 MED ORDER — FENTANYL CITRATE (PF) 100 MCG/2ML IJ SOLN
25.0000 ug | INTRAMUSCULAR | Status: DC | PRN
  Administered 2014-08-13: 50 ug via INTRAVENOUS
  Administered 2014-08-19 (×2): 25 ug via INTRAVENOUS
  Filled 2014-08-13 (×3): qty 2

## 2014-08-13 MED ORDER — BUDESONIDE 0.25 MG/2ML IN SUSP
0.2500 mg | Freq: Four times a day (QID) | RESPIRATORY_TRACT | Status: DC
  Administered 2014-08-13 – 2014-08-20 (×28): 0.25 mg via RESPIRATORY_TRACT
  Filled 2014-08-13 (×42): qty 2

## 2014-08-13 MED ORDER — QUETIAPINE FUMARATE 50 MG PO TABS
50.0000 mg | ORAL_TABLET | Freq: Every day | ORAL | Status: DC
  Administered 2014-08-13 – 2014-08-19 (×7): 50 mg
  Filled 2014-08-13 (×8): qty 1

## 2014-08-13 MED ORDER — BUDESONIDE 0.25 MG/2ML IN SUSP
0.2500 mg | Freq: Four times a day (QID) | RESPIRATORY_TRACT | Status: DC
  Filled 2014-08-13 (×4): qty 2

## 2014-08-13 MED ORDER — CLONAZEPAM 0.1 MG/ML ORAL SUSPENSION
1.0000 mg | Freq: Two times a day (BID) | ORAL | Status: DC
  Filled 2014-08-13 (×2): qty 10

## 2014-08-13 MED ORDER — CLONAZEPAM 1 MG PO TABS
1.0000 mg | ORAL_TABLET | Freq: Two times a day (BID) | ORAL | Status: DC
  Administered 2014-08-13 – 2014-08-14 (×4): 1 mg via ORAL
  Filled 2014-08-13 (×5): qty 1

## 2014-08-13 NOTE — Progress Notes (Signed)
PULMONARY / CRITICAL CARE MEDICINE   Name: Phillip May MRN: [id number] DOB: 1945/05/10    ADMISSION DATE:  08/07/2014  CHIEF COMPLAINT:  SOB  INITIAL PRESENTATION: 44 M with severe COPD, recent hospitalization for aspiration who was transferred to [facility] on 6/25 for worsening SOB likely 2/2 aspiration. In acute on chronic hypercarbic RF on arrival and placed on BiPAP; he did not improve and PCCM was consulted for further management.    STUDIES:  ABG 7.232/72.9/85 --> 7.175/80.2/76.0 after starting BiPAP Chest X-ray 6/25 -- > Resolving RLL opacity  SIGNIFICANT EVENTS: Intubation 08/08/2014 6/27- discussion about plan of care 6/28- d/w son- wants full code up until maximal therapy given, abx duration then dnr and extubation then after further discussions. If coded, they would want one shot at resus then dnr 6/30 Not weanable. CXR gradually improving. Further discussion of goals of care with son. Recommended continued support for time limit (into early to mid part of next week) with one way extubation when status dictates and DNR 7/01 Much improved. Calmer. Tolerated PS 5-10 cm H2O. Copious secretions and intermittent agitation prohibited extubation  SUBJECTIVE: RASS 0. + F/C. Tolerating PS 5-10 cm H2O  VITAL SIGNS: Temp:  [97.3 F (36.3 C)-98 F (36.7 C)] 97.3 F (36.3 C) (07/01 1704) Pulse Rate:  [54-103] 57 (07/01 1700) Resp:  [12-22] 14 (07/01 1700) BP: (80-185)/(44-133) 120/73 mmHg (07/01 1700) SpO2:  [97 %-100 %] 100 % (07/01 1700) FiO2 (%):  [10 %-40 %] 40 % (07/01 1600) HEMODYNAMICS:   VENTILATOR SETTINGS: Vent Mode:  [-] PSV;CPAP FiO2 (%):  [10 %-40 %] 40 % Set Rate:  [20 bmp] 20 bmp Vt Set:  [400 mL] 400 mL PEEP:  [5 cmH20] 5 cmH20 Pressure Support:  [5 cmH20-10 cmH20] 10 cmH20 Plateau Pressure:  [16 cmH20-19 cmH20] 16 cmH20 INTAKE / OUTPUT:  Intake/Output Summary (Last 24 hours) at 08/13/14 1717 Last data filed at 08/13/14 1700  Gross per 24 hour  Intake  1972.84 ml  Output   1230 ml  Net 742.84 ml    PHYSICAL EXAMINATION: General: Cachectic, RASS -1, + F/C Neuro: MAEs, CNs intact HEENT: temporal wasting Cardiovascular: Reg, no M Lungs: diminished throughout, no wheezes, prolonged exp phase Abdomen: scaphoid, NT, G tube site clean Ext: warm, no edema  LABS:  CBC  Recent Labs Lab 08/11/14 0750 08/12/14 0318 08/13/14 0345  WBC 10.0 6.6 6.3  HGB 8.9* 9.9* 9.9*  HCT 26.1* 29.6* 29.0*  PLT 324 332 305   Coag's  Recent Labs Lab 08/08/14 0145  APTT 31  INR 1.06   BMET  Recent Labs Lab 08/11/14 0303 08/12/14 0318 08/13/14 0345  NA 134* 136 136  K 3.7 4.1 3.8  CL 95* 98* 97*  CO2 BUN 30* 28* 25*  CREATININE 1.08 0.72 0.61  GLUCOSE 125* 140* 148*   Electrolytes  Recent Labs Lab 08/08/14 0145  08/11/14 0303 08/12/14 0318 08/13/14 0345  CALCIUM 8.3*  < > 8.2* 8.7* 8.8*  MG 1.2*  --  1.8 1.9  --   PHOS 5.2*  --  3.5 2.2*  --   < > = values in this interval not displayed. Sepsis Markers  Recent Labs Lab 08/07/14 2024  08/08/14 0219 08/08/14 1500 08/09/14 0400 08/10/14 0358 08/13/14 0345  LATICACIDVEN 1.60  --  1.5 2.1*  --   --   --   PROCALCITON  --   < >  --   --  30.74 19.52 1.26  < > =  values in this interval not displayed. ABG  Recent Labs Lab 08/08/14 0224 08/08/14 0505 08/09/14 0327  PHART 7.356 7.314* 7.350  PCO2ART 42.6 49.8* 51.2*  PO2ART 81.0 102* 92.0   Liver Enzymes  Recent Labs Lab 08/08/14 0145 08/09/14 0400 08/13/14 0345  AST 36 34 28  ALT 29 23 25   ALKPHOS 84 72 58  BILITOT 0.5 0.5 0.5  ALBUMIN 2.3* 2.1* 2.1*   Cardiac Enzymes  Recent Labs Lab 08/08/14 0145 08/08/14 0718 08/08/14 1550  TROPONINI <0.03 <0.03 0.05*   Glucose  Recent Labs Lab 08/12/14 0350 08/12/14 0838 08/12/14 1209 08/12/14 1702 08/12/14 2311 08/13/14 0747  GLUCAP 135* 94 126* 151* 138* 113*    CXR:     ASSESSMENT / PLAN:  PULMONARY A:  Acute hypoxic,  hypercarbic resp failure RLL PNA Severe COPD (FEV1 < 1 liter in 2013) Poor candidate for trach tube and long term vent P:   Cont vent support - settings reviewed and/or adjusted Wean in PSV as toelrated Cont vent bundle Daily SBT if/when meets criteria Need to clarify re-intubation status before extubation  CARDIOVASCULAR A:  Freq PACs, resolved P:   Cont nebivolol   RENAL A:   Hyponatremia, resolved AKI, resolved P:   Monitor BMET intermittently Monitor I/Os Correct electrolytes as indicated  GASTROINTESTINAL A:   Chronic dysphagia and aspiration  G tube present Severe protein-calorie malnutrition P: SUP: enteral famotidine Cont TFs  HEMATOLOGIC A:   Chronic anemia without overt blood loss P:  DVT px: SQ heparin Monitor CBC intermittently Transfuse per usual ICU guidelines  INFECTIOUS A:   Severe R>L LL PNA, NOS P:   BCx2 6/25>>> NEG UC:6/25>>>NEG Sputum: 6/25>> NOF Abx:   Vanc 6/26>>> 6/28 Zosyn 6/26>>>6/28 Ceftriaxone 6/28>>>  Stop date ordered  ENDOCRINE A:   Hyperglycemia, mild P:   CBGs q 8 hrs Resume SSI for glu > 180  NEUROLOGIC A:   H/O EtOH abuse Acute encephalopathy Severe agitation - much improved Severe baseline deconditioning P: Cont dexmedetomidine gtt Cont PRN fentanyl Low dsoe clonazepam initiated 7/01 Cont daily WUA Cont goals of care/EOL discussions  CCM: 35 mins   Billy Fischeravid Toiya Morrish, MD ; [facility] service Mobile [phone].  After 5:30 PM or weekends, call [phone]  08/13/2014 5:17 PM

## 2014-08-13 NOTE — Progress Notes (Signed)
Nutrition Follow-up  DOCUMENTATION CODES:  Severe malnutrition in context of chronic illness, Underweight  INTERVENTION:  Vital AF 1.2 @ 45 ml/hr via PEG tube   Tube feeding regimen provides 1296 kcal (98% of needs), 81 grams of protein, and 875 ml of H2O.  Total free water: 1175 ml/hr  NUTRITION DIAGNOSIS:  Malnutrition related to chronic illness as evidenced by severe depletion of body fat, severe depletion of muscle mass.  ongoing  GOAL:  Patient will meet greater than or equal to 90% of their needs  met  MONITOR:  TF tolerance, Skin, I & O's, Vent status, Labs  REASON FOR ASSESSMENT:  Consult Enteral/tube feeding initiation and management  ASSESSMENT:  Pt with hx of severe COPD and recent hospitalization for aspiration. Pt had PEG placed 6/15 and started on TF. Pt admitted 6/25 for worsening SOB likely secondary to aspiration, placed on BiPAP did not improve and was intubated.   Patient is currently intubated on ventilator support MV: 7.1 L/min Temp (24hrs), Avg:97.7 F (36.5 C), Min:97.3 F (36.3 C), Max:98 F (36.7 C)   Per MD notes continue aggressive care for now.  Labs reviewed: CBGs: 113-130 Medications reviewed and include:miralax Noted pressure ulcer Free water: 100 ml TID  Height:  Ht Readings from Last 1 Encounters:  08/07/14 _0  (1.702 m)    Weight:  Wt Readings from Last 1 Encounters:  08/12/14 107 lb 5.8 oz (48.7 kg)    Ideal Body Weight:  67.2 kg  Wt Readings from Last 10 Encounters:  08/12/14 107 lb 5.8 oz (48.7 kg)  07/30/14 101 lb 6.6 oz (46 kg)  05/21/11 126 lb (57.153 kg)  04/23/11 131 lb (59.421 kg)  03/13/11 128 lb 9.6 oz (58.333 kg)    BMI:  Body mass index is 16.81 kg/(m^2).  Estimated Nutritional Needs:  Kcal:  1325  Protein:  70-85 grams  Fluid:  > 1.3 L/day  Skin:  Wound (see comment) (stage II sacrum)  Diet Order:     EDUCATION NEEDS:  No education needs identified at this  time   Intake/Output Summary (Last 24 hours) at 08/13/14 1313 Last data filed at 08/13/14 1200  Gross per 24 hour  Intake 2063.38 ml  Output   1255 ml  Net 808.38 ml    Last BM:  6/30  [place], [place], [place] Pager [phone] After Hours Pager

## 2014-08-13 NOTE — Clinical Social Work Note (Signed)
Clinical Social Work Assessment  Patient Details  Name: Phillip May MRN: [id number] Date of Birth: 1945-08-23  Date of referral:  08/13/14               Reason for consult:  Facility Placement                Permission sought to share information with:  Family Supports (CSW unable to talk with patient, spoke with son Italyhad We[place]old) Permission granted to share information::  No (Permission not given by patient)  Name::     Son, Italyhad [place] [phone]) and daughter Lorin Picketajah Sundberg are HCPOA's [phone])  Agency::     Relationship::     Contact Information:     Housing/Transportation Living arrangements for the past 2 months:  Independent Living Facility, Skilled Nursing Facility (Patient admitted to [facility] 7 days before coming to hospital per family) Source of Information:  Adult Children (CSW talked with son, Italyhad [place]) Patient Interpreter Needed:  None Criminal Activity/Legal Involvement Pertinent to Current Situation/Hospitalization:  No - Comment as needed Significant Relationships:    Lives with:  Adult Children Do you feel safe going back to the place where you live?  Yes Need for family participation in patient care:  Yes (Comment)  Care giving concerns:  Patient critically ill, currently intubated.    Social Worker assessment / plan:  CSW talked by phone with patient's son, Italyhad Keitt [phone]) regarding discharge plans. Son confirmed that patient is from [facility] and he and his sister have not been very pleased with the care there.  [name] indicated that when patient becomes ready for d/c, if another facility not chosen, he will return to [facility] and they will continue to search for other placement. Son informed that SNF list can be emailed to him and email address provided: [email or web address].  Employment status:  Disabled (Comment on whether or not currently receiving Disability), Retired Health and safety inspectornsurance information:  Medicare, Managed Care PT  Recommendations:  Not assessed at this time Information / Referral to community resources:  Other (Comment Required) (None requested at this time)  Patient/Family's Response to care:  No concerns expressed regarding current care provided.  Patient/Family's Understanding of and Emotional Response to Diagnosis, Current Treatment, and Prognosis:  Not discussed.  Emotional Assessment Appearance:  Other (Comment Required (Unable to assess at this time) Attitude/Demeanor/Rapport:  Unable to Assess - patient intubated Affect (typically observed):  Unable to Assess - patient intubated Orientation:   (Patient intubated) Alcohol / Substance use:  Tobacco Use, Alcohol Use (Patient previously reported that he smokes and drinks alcohol) Psych involvement (Current and /or in the community):  No (Comment)  Discharge Needs  Concerns to be addressed:  Discharge Planning Concerns Readmission within the last 30 days:  Yes Current discharge risk:  None Barriers to Discharge:  No Barriers Identified   Cristobal GoldmannCrawford, Garnette Greb Bradley, LCSW 08/13/2014, 4:15 PM

## 2014-08-13 NOTE — Progress Notes (Signed)
Visit to patients room for ventilator monitor check noticed patient has worked his ett tube out again now at 24 lip.Phillip May. ett holder is not secured well due to busy activity of patient.  Replaced holder and advanced back to 25

## 2014-08-14 ENCOUNTER — Inpatient Hospital Stay (HOSPITAL_COMMUNITY): Payer: Medicare Other

## 2014-08-14 LAB — GLUCOSE, CAPILLARY
GLUCOSE-CAPILLARY: 87 mg/dL (ref 65–99)
Glucose-Capillary: 110 mg/dL — ABNORMAL HIGH (ref 65–99)

## 2014-08-14 LAB — BASIC METABOLIC PANEL
Anion gap: 11 (ref 5–15)
BUN: 25 mg/dL — ABNORMAL HIGH (ref 6–20)
CALCIUM: 8.6 mg/dL — AB (ref 8.9–10.3)
CHLORIDE: 98 mmol/L — AB (ref 101–111)
CO2: 26 mmol/L (ref 22–32)
CREATININE: 0.58 mg/dL — AB (ref 0.61–1.24)
GFR calc non Af Amer: >60 mL/min (ref >60)
Glucose, Bld: 87 mg/dL (ref 65–99)
Potassium: 4.3 mmol/L (ref 3.5–5.1)
SODIUM: 135 mmol/L (ref 135–145)

## 2014-08-14 LAB — CBC
HCT: 33.8 % — ABNORMAL LOW (ref 39.0–52.0)
Hemoglobin: 11.2 g/dL — ABNORMAL LOW (ref 13.0–17.0)
MCH: 31.1 pg (ref 26.0–34.0)
MCHC: 33.1 g/dL (ref 30.0–36.0)
MCV: 93.9 fL (ref 78.0–100.0)
PLATELETS: 267 10*3/uL (ref 150–400)
RBC: 3.6 MIL/uL — AB (ref 4.22–5.81)
RDW: 14.3 % (ref 11.5–15.5)
WBC: 10.2 10*3/uL (ref 4.0–10.5)

## 2014-08-14 LAB — CLOSTRIDIUM DIFFICILE BY PCR: Toxigenic C. Difficile by PCR: NEGATIVE

## 2014-08-14 NOTE — Progress Notes (Signed)
PULMONARY / CRITICAL CARE MEDICINE   Name: [name] MRN: [id number] DOB: [birth year]    ADMISSION DATE:  08/07/2014  CHIEF COMPLAINT:  SOB  INITIAL PRESENTATION: 53 M with severe COPD, recent hospitalization for aspiration who was transferred to [facility] on 6/25 for worsening SOB likely 2/2 aspiration. In acute on chronic hypercarbic RF on arrival and placed on BiPAP; he did not improve and PCCM was consulted for further management.    STUDIES:  ABG 7.232/72.9/85 --> 7.175/80.2/76.0 after starting BiPAP Chest X-ray 6/25 -- > Resolving RLL opacity  SIGNIFICANT EVENTS: Intubation 08/08/2014 6/27- discussion about plan of care 6/28- d/w son- wants full code up until maximal therapy given, abx duration then dnr and extubation then after further discussions. If coded, they would want one shot at resus then dnr 6/30 Not weanable. CXR gradually improving. Further discussion of goals of care with son. Recommended continued support for time limit (into early to mid part of next week) with one way extubation when status dictates and DNR 7/01 Much improved. Calmer. Tolerated PS 5-10 cm H2O. Copious secretions and intermittent agitation prohibited extubation  SUBJECTIVE: Follow commands, secretions small tomoderate  VITAL SIGNS: Temp:  [97.3 F (36.3 C)-98.3 F (36.8 C)] 98.3 F (36.8 C) (07/02 0811) Pulse Rate:  [31-77] 77 (07/02 0846) Resp:  [12-24] 24 (07/02 0846) BP: (88-143)/(62-92) 136/89 mmHg (07/02 0846) SpO2:  [98 %-100 %] 98 % (07/02 0846) FiO2 (%):  [40 %] 40 % (07/02 0846) HEMODYNAMICS:   VENTILATOR SETTINGS: Vent Mode:  [-] CPAP;PSV FiO2 (%):  [40 %] 40 % Set Rate:  [20 bmp] 20 bmp Vt Set:  [400 mL] 400 mL PEEP:  [5 cmH20] 5 cmH20 Pressure Support:  [5 cmH20-10 cmH20] 5 cmH20 Plateau Pressure:  [17 cmH20-20 cmH20] 17 cmH20 INTAKE / OUTPUT:  Intake/Output Summary (Last 24 hours) at 08/14/14 1010 Last data filed at 08/14/14 0900  Gross per 24 hour  Intake   1417 ml   Output    655 ml  Net    762 ml    PHYSICAL EXAMINATION: General: Cachectic, RASS 0, fc Neuro: MAEs, CNs intact HEENT: temporal wasting Cardiovascular: Reg, no M Lungs: diminished bases Abdomen: scaphoid, NT, G tube site clean Ext: warm, no edema  LABS:  CBC  Recent Labs Lab 08/12/14 0318 08/13/14 0345 08/14/14 0238  WBC 6.6 6.3 10.2  HGB 9.9* 9.9* 11.2*  HCT 29.6* 29.0* 33.8*  PLT 332 305 267   Coag's  Recent Labs Lab 08/08/14 0145  APTT 31  INR 1.06   BMET  Recent Labs Lab 08/12/14 0318 08/13/14 0345 08/14/14 0238  NA 136 136 135  K 4.1 3.8 4.3  CL 98* 97* 98*  CO2 31 30 26   BUN 28* 25* 25*  CREATININE 0.72 0.61 0.58*  GLUCOSE 140* 148* 87   Electrolytes  Recent Labs Lab 08/08/14 0145  08/11/14 0303 08/12/14 0318 08/13/14 0345 08/14/14 0238  CALCIUM 8.3*  < > 8.2* 8.7* 8.8* 8.6*  MG 1.2*  --  1.8 1.9  --   --   PHOS 5.2*  --  3.5 2.2*  --   --   < > = values in this interval not displayed. Sepsis Markers  Recent Labs Lab 08/07/14 2024  08/08/14 0219 08/08/14 1500 08/09/14 0400 08/10/14 0358 08/13/14 0345  LATICACIDVEN 1.60  --  1.5 2.1*  --   --   --   PROCALCITON  --   < >  --   --  30.74 19.52  1.26  < > = values in this interval not displayed. ABG  Recent Labs Lab 08/08/14 0224 08/08/14 0505 08/09/14 0327  PHART 7.356 7.314* 7.350  PCO2ART 42.6 49.8* 51.2*  PO2ART 81.0 102* 92.0   Liver Enzymes  Recent Labs Lab 08/08/14 0145 08/09/14 0400 08/13/14 0345  AST 36 34 28  ALT 29 23 25   ALKPHOS 84 72 58  BILITOT 0.5 0.5 0.5  ALBUMIN 2.3* 2.1* 2.1*   Cardiac Enzymes  Recent Labs Lab 08/08/14 0145 08/08/14 0718 08/08/14 1550  TROPONINI <0.03 <0.03 0.05*   Glucose  Recent Labs Lab 08/12/14 1702 08/12/14 2311 08/13/14 0747 08/13/14 1708 08/13/14 2322 08/14/14 0804  GLUCAP 151* 138* 113* 118* 110* 87    CXR:     ASSESSMENT / PLAN:  PULMONARY A:  Acute hypoxic, hypercarbic resp failure RLL  PNA Severe COPD (FEV1 < 1 liter in 2013) P:   Weaning this am cpap 5 ps 5 goal 1 hr assess rsbi Even balance At rest avoid alk  CARDIOVASCULAR A:  Freq PACs, resolved P:   Cont nebivolol   RENAL A:   Hyponatremia, resolved AKI, resolved P:   Even balance kvod  GASTROINTESTINAL A:   Chronic dysphagia and aspiration  G tube present Severe protein-calorie malnutrition P: SUP: enteral famotidine Cont TFs  HEMATOLOGIC A:   Chronic anemia without overt blood loss P:  DVT px: SQ heparin Monitor CBC intermittently limit blood draws as able  INFECTIOUS A:   Severe R>L LL PNA, NOS R/o cdiff P:   BCx2 6/25>>> NEG UC:6/25>>>NEG Sputum: 6/25>> NOF Abx:   Vanc 6/26>>> 6/28 Zosyn 6/26>>>6/28 Ceftriaxone 6/28>>>7/3  Consider dc all abx day 8 in am  Sending cdiff, low suspcion  ENDOCRINE A:   Hyperglycemia, mild P:   CBGs q 8 hrs Resume SSI for glu > 180  NEUROLOGIC A:   H/O EtOH abuse Acute encephalopathy Severe agitation - much improved Severe baseline deconditioning P: Cont dexmedetomidine gtt Cont PRN fentanyl Low dsoe clonazepam initiated 7/01 Cont daily WUA Cont goals of care/EOL discussions - son has not visited once. He had told me trach would not be wished. Will pray that if we are able to extubate, can get pt to decide on his wishes.  CCM: 30 min  Lavon Paganini. Titus Mould, MD, [place] Pgr: [facility]

## 2014-08-14 NOTE — Procedures (Signed)
Extubation Procedure Note  Patient Details:   Name: Phillip CastleJames L Cronk DOB: 28-Nov-1945 MRN: [id number]   Airway Documentation:  + air leak around cuff prior to extubation.    Evaluation  O2 sats: stable throughout Complications: No apparent complications Patient did tolerate procedure well. Bilateral Breath Sounds: Diminished, Other (Comment) (coarse) Suctioning: Oral, Airway Yes pt able to speak.  No stridor noted. No distress noted, VSS.  RN at bedside.   Jennette KettleBrowning, Makhiya Coburn Joy 08/14/2014, 11:24 AM

## 2014-08-14 NOTE — Progress Notes (Signed)
RN called d/t pt desat at 68% on [place], pt immediately placed on NRB 100% fio2.  Noted [facility] w/ diffuse rhonchi.  Black box called, pt NTS for copious amount of secretions.  MD aware, MD ordered prn bipap if pt sat doesn't improve.  Sat 100% on NRB post suctioning pt.  No resp distress noted post suctioning.  VSS. RN at bedside, cleaning pt.

## 2014-08-14 NOTE — Progress Notes (Signed)
Wasted 225 ml of fentanyl in sink with Lubertha BasqueSara RN.

## 2014-08-15 ENCOUNTER — Inpatient Hospital Stay (HOSPITAL_COMMUNITY): Payer: Medicare Other

## 2014-08-15 DIAGNOSIS — Z978 Presence of other specified devices: Secondary | ICD-10-CM | POA: Insufficient documentation

## 2014-08-15 LAB — BLOOD GAS, ARTERIAL
Acid-Base Excess: 6 mmol/L — ABNORMAL HIGH (ref 0.0–2.0)
Bicarbonate: 30 mEq/L — ABNORMAL HIGH (ref 20.0–24.0)
DRAWN BY: 437071
O2 CONTENT: 6 L/min
O2 SAT: 96.8 %
PCO2 ART: 43.7 mmHg (ref 35.0–45.0)
PO2 ART: 80 mmHg (ref 80.0–100.0)
Patient temperature: 98.6
TCO2: 31.3 mmol/L (ref 0–100)
pH, Arterial: 7.452 — ABNORMAL HIGH (ref 7.350–7.450)

## 2014-08-15 LAB — GLUCOSE, CAPILLARY
GLUCOSE-CAPILLARY: 91 mg/dL (ref 65–99)
Glucose-Capillary: 106 mg/dL — ABNORMAL HIGH (ref 65–99)

## 2014-08-15 MED ORDER — ACETYLCYSTEINE 20 % IN SOLN
4.0000 mL | Freq: Two times a day (BID) | RESPIRATORY_TRACT | Status: DC
  Administered 2014-08-15 – 2014-08-20 (×10): 4 mL via RESPIRATORY_TRACT
  Filled 2014-08-15 (×13): qty 4

## 2014-08-15 MED ORDER — FUROSEMIDE 10 MG/ML IJ SOLN
40.0000 mg | Freq: Two times a day (BID) | INTRAMUSCULAR | Status: DC
Start: 2014-08-15 — End: 2014-08-16
  Administered 2014-08-15 (×2): 40 mg via INTRAVENOUS
  Filled 2014-08-15 (×3): qty 4

## 2014-08-15 NOTE — Progress Notes (Signed)
rec'd pt on 5 lpm [place], sat 100%.  Per RN, pt coughed large amount of secretions into bipap mask.  RN removed bipap mask to clean pt and oral suction and placed pt on [place]. No distress noted presently.  VSS, sat 100%.

## 2014-08-15 NOTE — Progress Notes (Signed)
PT Cancellation Note  Patient Details Name: Francene CastleJames L Becvar MRN: [id number] DOB: [birth year]   Cancelled Treatment:    Reason Eval/Treat Not Completed: Medical issues which prohibited therapy.  Patient was extubated 08/14/14 (yesterday).  Today, returned to BiPAP > NRB due to increased resp distress.  Will hold PT today, and return tomorrow for PT evaluation if appropriate for patient.   Vena AustriaDavis, Renwick Asman H 08/15/2014, 5:43 PM Durenda HurtSusan H. Renaldo Fiddleravis, PT, [facility] Acute [facility] Services Pager [phone]

## 2014-08-15 NOTE — Progress Notes (Signed)
eLink Physician-Brief Progress Note Patient Name: Phillip CastleJames L May DOB: 01-Jun-1945 MRN: [id number]   Date of Service  08/15/2014  HPI/Events of Note  S/p extubation 08/14/14 . Currently RN concerned aptient somnolent and needing high flow o2   Camera exam confirms lethargy and tachypnea and hypoxemia  eICU Interventions  Stat abg decideon bipap v intubation based on abg     Intervention Category Major Interventions: Respiratory failure - evaluation and management  Matvey Llanas 08/15/2014, 2:07 AM

## 2014-08-15 NOTE — Progress Notes (Signed)
Pt w/ rhonchi t/o, MD at bedside and requests NTS.  Post NTS, pt desaturated to 75% and did not recover on NRB.  BIPAP reinitiated, sats immed improved to 98-100%.  RN and MD at bedside during this episode.

## 2014-08-15 NOTE — Progress Notes (Signed)
eLink Physician-Brief Progress Note Patient Name: Phillip CastleJames L May DOB: 1945-03-22 MRN: [id number]   Date of Service  08/15/2014  HPI/Events of Note   Recent Labs Lab 08/08/14 0505 08/09/14 0327 08/15/14 0240  PHART 7.314* 7.350 7.452*  PCO2ART 49.8* 51.2* 43.7  PO2ART 102* 92.0 80.0  HCO3 24.9* 28.3* 30.0*  TCO2 26.4 30 31.3  O2SAT 97.7 97.0 96.8     eICU Interventions  abg ok byut with mild-mod resp distress  Start bipap     Intervention Category Major Interventions: Respiratory failure - evaluation and management  Shaquaya Wuellner 08/15/2014, 4:08 AM

## 2014-08-15 NOTE — Progress Notes (Signed)
Patient ID: Phillip CastleJames L Pautsch, male   DOB: 1945/08/15, 69 y.o.   MRN: [id number] I have had extensive discussions with patient alert and oreinted. We discussed patients current circumstances and organ failures and pNA and poor secretion control, copd baseline HE  has decided to NOT perform resuscitation if arrest but to continue current medical support for now. He also DOES NOT WANT ETT OR TRACH under any circumstances and comfort if fails I will call son to update him  Mcarthur RossettiDaniel J. Tyson AliasFeinstein, MD, FACP Pgr: [phone] [place] [facility]  Nena PolioShaw Rn in room , confirmed wishes

## 2014-08-15 NOTE — Progress Notes (Signed)
PULMONARY / CRITICAL CARE MEDICINE   Name: TORE CARREKER MRN: [id number] DOB: 07-Jul-1945    ADMISSION DATE:  08/07/2014  CHIEF COMPLAINT:  SOB  INITIAL PRESENTATION: 54 M with severe COPD, recent hospitalization for aspiration who was transferred to [facility] on 6/25 for worsening SOB likely 2/2 aspiration. In acute on chronic hypercarbic RF on arrival and placed on BiPAP; he did not improve and PCCM was consulted for further management.    STUDIES:  ABG 7.232/72.9/85 --> 7.175/80.2/76.0 after starting BiPAP Chest X-ray 6/25 -- > Resolving RLL opacity  SIGNIFICANT EVENTS: Intubation 08/08/2014 6/27- discussion about plan of care 6/28- d/w son- wants full code up until maximal therapy given, abx duration then dnr and extubation then after further discussions. If coded, they would want one shot at resus then dnr 6/30 Not weanable. CXR gradually improving. Further discussion of goals of care with son. Recommended continued support for time limit (into early to mid part of next week) with one way extubation when status dictates and DNR 7/01 Much improved. Calmer. Tolerated PS 5-10 cm H2O. Copious secretions and intermittent agitation prohibited extubation 7/2 extubated   SUBJECTIVE: Distress overnight,. NIMV required, secretions concerning  VITAL SIGNS: Temp:  [98.2 F (36.8 C)-98.9 F (37.2 C)] 98.4 F (36.9 C) (07/03 0800) Pulse Rate:  [39-93] 93 (07/03 0900) Resp:  [20-30] 28 (07/03 0900) BP: (104-174)/(69-121) 148/86 mmHg (07/03 0900) SpO2:  [83 %-100 %] 100 % (07/03 0900) FiO2 (%):  [60 %-100 %] 60 % (07/03 0800) HEMODYNAMICS:   VENTILATOR SETTINGS: Vent Mode:  [-] BIPAP FiO2 (%):  [60 %-100 %] 60 % PEEP:  [5 cmH20] 5 cmH20 Pressure Support:  [5 cmH20] 5 cmH20 INTAKE / OUTPUT:  Intake/Output Summary (Last 24 hours) at 08/15/14 0943 Last data filed at 08/15/14 0900  Gross per 24 hour  Intake 1162.3 ml  Output   1257 ml  Net  -94.7 ml    PHYSICAL EXAMINATION: General:  Cachectic, RASS 0 follows commands, appropriate Neuro: CNs intact HEENT: temporal wasting, jvd  noted Cardiovascular: Reg, no M Lungs: reduced ronchi Abdomen: scaphoid, NT, G tube site clean Ext: warm, no edema  LABS:  CBC  Recent Labs Lab 08/12/14 0318 08/13/14 0345 08/14/14 0238  WBC 6.6 6.3 10.2  HGB 9.9* 9.9* 11.2*  HCT 29.6* 29.0* 33.8*  PLT 332 305 267   Coag's No results for input(s): APTT, INR in the last 168 hours. BMET  Recent Labs Lab 08/12/14 0318 08/13/14 0345 08/14/14 0238  NA 136 136 135  K 4.1 3.8 4.3  CL 98* 97* 98*  CO2 BUN 28* 25* 25*  CREATININE 0.72 0.61 0.58*  GLUCOSE 140* 148* 87   Electrolytes  Recent Labs Lab 08/11/14 0303 08/12/14 0318 08/13/14 0345 08/14/14 0238  CALCIUM 8.2* 8.7* 8.8* 8.6*  MG 1.8 1.9  --   --   PHOS 3.5 2.2*  --   --    Sepsis Markers  Recent Labs Lab 08/08/14 1500 08/09/14 0400 08/10/14 0358 08/13/14 0345  LATICACIDVEN 2.1*  --   --   --   PROCALCITON  --  30.74 19.52 1.26   ABG  Recent Labs Lab 08/09/14 0327 08/15/14 0240  PHART 7.350 7.452*  PCO2ART 51.2* 43.7  PO2ART 92.0 80.0   Liver Enzymes  Recent Labs Lab 08/09/14 0400 08/13/14 0345  AST 34 28  ALT 23 25  ALKPHOS 72 58  BILITOT 0.5 0.5  ALBUMIN 2.1* 2.1*   Cardiac Enzymes  Recent Labs  Lab 08/08/14 1550  TROPONINI 0.05*   Glucose  Recent Labs Lab 08/12/14 2311 08/13/14 0747 08/13/14 1708 08/13/14 2322 08/14/14 0804 08/15/14 0423  GLUCAP 138* 113* 118* 110* 87 91    CXR: none  ASSESSMENT / PLAN:  PULMONARY A:  Acute hypoxic, hypercarbic resp failure RLL PNA Severe COPD (FEV1 < 1 liter in 2013) P:   I have concerns for use NIMV with secretions, dc Will discuss with pt wishes for re intubation followed by trach pcxr now for collapse Add mucomysts Chest pt pcxr in am  abg wnl Consider lasix further  CARDIOVASCULAR A:  Freq PACs, resolved P:   Cont nebivolol  tele  RENAL A:    Hyponatremia, resolved AKI, resolved P:   Even balance kvod  GASTROINTESTINAL A:   Chronic dysphagia and aspiration  G tube present Severe protein-calorie malnutrition P: SUP: enteral famotidine Cont TFs peg  HEMATOLOGIC A:   Chronic anemia without overt blood loss P:  DVT px: SQ heparin  INFECTIOUS A:   Severe R>L LL PNA, NOS R/o cdiff P:   BCx2 6/25>>> NEG UC:6/25>>>NEG Sputum: 6/25>> NOF Abx:   Vanc 6/26>>> 6/28 Zosyn 6/26>>>6/28 Ceftriaxone 6/28>>>7/3  Allow abx to dc cdiff neg  ENDOCRINE A:   Hyperglycemia, mild P:   CBGs q 8 hrs Resume SSI for glu > 180  NEUROLOGIC A:   H/O EtOH abuse Acute encephalopathy Severe agitation - much improved Severe baseline deconditioning P: Cont PRN fentanyl clonazepam idc Cont daily WUA Pt alert, oriented, will discuss his own wishes (see additional note) PT needed  CCM: 30 min  Mcarthur Rossettianiel J. Tyson AliasFeinstein, MD, FACP Pgr: [phone] [place] [facility]

## 2014-08-15 NOTE — Progress Notes (Signed)
Patient ID: Phillip May, male   DOB: 12/24/1945, 69 y.o.   MRN: [id number] Spoke to son. Re confiRMED with pt again his wishes. He is appropriate and judgment is intact. I then had [facility] also to repeat discussion with same result. Called son back. He now accepts and supports pt decision for no ett , dnr. He is better to some degree with suctioning and off bipap. Continue aggressive medical CARE OTHERWISE.  Phillip Rossettianiel J. Tyson AliasFeinstein, MD, FACP Pgr: [phone] [place] [facility]

## 2014-08-15 NOTE — Progress Notes (Signed)
At the bedside with Dr. Tyson AliasFeinstein discussing with patient his current medical status and options/treatments. Pt is alert and oriented, following commands. He was asked on multiple occassions what his wishes were and pt wishes to NOT be resuscitated, intubated, or have a tracheostomy.  Pt's son, Phillip May was called and updated and MD had long discussion with him about Phillip May's situation and his wishes.

## 2014-08-16 ENCOUNTER — Inpatient Hospital Stay (HOSPITAL_COMMUNITY): Payer: Medicare Other

## 2014-08-16 LAB — MAGNESIUM
Magnesium: 1.5 mg/dL — ABNORMAL LOW (ref 1.7–2.4)
Magnesium: 2.6 mg/dL — ABNORMAL HIGH (ref 1.7–2.4)

## 2014-08-16 LAB — BASIC METABOLIC PANEL
ANION GAP: 9 (ref 5–15)
BUN: 16 mg/dL (ref 6–20)
CALCIUM: 8.7 mg/dL — AB (ref 8.9–10.3)
CHLORIDE: 93 mmol/L — AB (ref 101–111)
CO2: 36 mmol/L — AB (ref 22–32)
CREATININE: 0.72 mg/dL (ref 0.61–1.24)
GFR calc Af Amer: >60 mL/min (ref >60)
GFR calc non Af Amer: >60 mL/min (ref >60)
GLUCOSE: 110 mg/dL — AB (ref 65–99)
Potassium: 3.6 mmol/L (ref 3.5–5.1)
Sodium: 138 mmol/L (ref 135–145)

## 2014-08-16 LAB — GLUCOSE, CAPILLARY
GLUCOSE-CAPILLARY: 103 mg/dL — AB (ref 65–99)
GLUCOSE-CAPILLARY: 95 mg/dL (ref 65–99)
GLUCOSE-CAPILLARY: 102 mg/dL — AB (ref 65–99)
GLUCOSE-CAPILLARY: 90 mg/dL (ref 65–99)

## 2014-08-16 LAB — PHOSPHORUS: Phosphorus: 3.9 mg/dL (ref 2.5–4.6)

## 2014-08-16 MED ORDER — SODIUM CHLORIDE 0.9 % IV SOLN
6.0000 g | Freq: Once | INTRAVENOUS | Status: AC
  Administered 2014-08-16: 6 g via INTRAVENOUS
  Filled 2014-08-16: qty 12

## 2014-08-16 MED ORDER — POTASSIUM CHLORIDE 20 MEQ/15ML (10%) PO SOLN
20.0000 meq | ORAL | Status: AC
  Administered 2014-08-16 (×2): 20 meq
  Filled 2014-08-16 (×2): qty 15

## 2014-08-16 MED ORDER — FUROSEMIDE 10 MG/ML IJ SOLN
40.0000 mg | Freq: Every day | INTRAMUSCULAR | Status: DC
  Administered 2014-08-16 – 2014-08-20 (×5): 40 mg via INTRAVENOUS
  Filled 2014-08-16 (×4): qty 4

## 2014-08-16 NOTE — Progress Notes (Signed)
PULMONARY / CRITICAL CARE MEDICINE   Name: Phillip May MRN: [id number] DOB: May 06, 1945    ADMISSION DATE:  08/07/2014  CHIEF COMPLAINT:  SOB  INITIAL PRESENTATION: 30 M with severe COPD, recent hospitalization for aspiration who was transferred to [facility] on 6/25 for worsening SOB likely 2/2 aspiration. In acute on chronic hypercarbic RF on arrival and placed on BiPAP; he did not improve and PCCM was consulted for further management.    STUDIES:  ABG 7.232/72.9/85 --> 7.175/80.2/76.0 after starting BiPAP Chest X-ray 6/25 -- > Resolving RLL opacity  SIGNIFICANT EVENTS: Intubation 08/08/2014 6/27- discussion about plan of care 6/28- d/w son- wants full code up until maximal therapy given, abx duration then dnr and extubation then after further discussions. If coded, they would want one shot at resus then dnr 6/30 Not weanable. CXR gradually improving. Further discussion of goals of care with son. Recommended continued support for time limit (into early to mid part of next week) with one way extubation when status dictates and DNR 7/01 Much improved. Calmer. Tolerated PS 5-10 cm H2O. Copious secretions and intermittent agitation prohibited extubation 7/2 extubated   SUBJECTIVE: Did ok overnight, Nasal cann O2, NTS needed Neg 1 liter  VITAL SIGNS: Temp:  [98.3 F (36.8 C)-98.8 F (37.1 C)] 98.6 F (37 C) (07/04 0400) Pulse Rate:  [48-98] 89 (07/04 0600) Resp:  [20-33] 25 (07/04 0600) BP: (117-212)/(59-111) 137/82 mmHg (07/04 0600) SpO2:  [77 %-100 %] 97 % (07/04 0740) FiO2 (%):  [40 %-100 %] 40 % (07/03 1100) HEMODYNAMICS:   VENTILATOR SETTINGS: Vent Mode:  [-] BIPAP;PCV FiO2 (%):  [40 %-100 %] 40 % Set Rate:  [12 bmp] 12 bmp PEEP:  [8 cmH20] 8 cmH20 INTAKE / OUTPUT:  Intake/Output Summary (Last 24 hours) at 08/16/14 0856 Last data filed at 08/16/14 0649  Gross per 24 hour  Intake 1501.75 ml  Output   2500 ml  Net -998.25 ml    PHYSICAL EXAMINATION: General:  Cachectic, RASS 0 follows commands, appropriate,poor phonation from secretions, said his name Neuro: CNs intact HEENT: temporal wasting, clears ronchi in throat Cardiovascular: Reg, no M Lungs: reduced, anterior cta Abdomen: scaphoid, NT, G tube site wnl Ext: warm, no edema  LABS:  CBC  Recent Labs Lab 08/12/14 0318 08/13/14 0345 08/14/14 0238  WBC 6.6 6.3 10.2  HGB 9.9* 9.9* 11.2*  HCT 29.6* 29.0* 33.8*  PLT 332 305 267   Coag's No results for input(s): APTT, INR in the last 168 hours. BMET  Recent Labs Lab 08/13/14 0345 08/14/14 0238 08/16/14 0228  NA 136 135 138  K 3.8 4.3 3.6  CL 97* 98* 93*  CO2 30 26 36*  BUN 25* 25* 16  CREATININE 0.61 0.58* 0.72  GLUCOSE 148* 87 110*   Electrolytes  Recent Labs Lab 08/11/14 0303 08/12/14 0318 08/13/14 0345 08/14/14 0238 08/16/14 0228  CALCIUM 8.2* 8.7* 8.8* 8.6* 8.7*  MG 1.8 1.9  --   --  1.5*  PHOS 3.5 2.2*  --   --  3.9   Sepsis Markers  Recent Labs Lab 08/10/14 0358 08/13/14 0345  PROCALCITON 19.52 1.26   ABG  Recent Labs Lab 08/15/14 0240  PHART 7.452*  PCO2ART 43.7  PO2ART 80.0   Liver Enzymes  Recent Labs Lab 08/13/14 0345  AST 28  ALT 25  ALKPHOS 58  BILITOT 0.5  ALBUMIN 2.1*   Cardiac Enzymes No results for input(s): TROPONINI, PROBNP in the last 168 hours. Glucose  Recent Labs Lab 08/13/14 1708  08/13/14 2322 08/14/14 0804 08/15/14 0423 08/15/14 1625 08/16/14 0209  GLUCAP 118* 110* 87 91 106* 103*    CXR: hand on chest, improved atx rt   ASSESSMENT / PLAN:  PULMONARY A:  Acute hypoxic, hypercarbic resp failure RLL PNA Severe COPD (FEV1 < 1 liter in 2013) P:   Hope son ewill be able to visit today, he has accepted pt wishes NTS crucial, remain in icu for secretion statu sneeds mucomysts x 24 hrs more Chest pt as he tolerates pcxr improved over all Last abg ventilation wnl, no repeat needed Neg 500 cc goal Prn bipap ok , was not needed  CARDIOVASCULAR A:   SR P:   Cont nebivolol  tele  RENAL A:   Hyponatremia, resolved AKI, resolved Hypomag, k  P:   Even balance kvo k , mag supp  GASTROINTESTINAL A:   Chronic dysphagia and aspiration  G tube present Severe protein-calorie malnutrition P: SUP: enteral famotidine Cont TFs peg  HEMATOLOGIC A:   Chronic anemia without overt blood loss P:  DVT px: SQ heparin  INFECTIOUS A:   Severe R>L LL PNA, NOS R/o cdiff P:   BCx2 6/25>>> NEG UC:6/25>>>NEG Sputum: 6/25>> NOF Abx:   Vanc 6/26>>> 6/28 Zosyn 6/26>>>6/28 Ceftriaxone 6/28>>>7/3  S/p full course abx, pcxr also better cdiff neg  ENDOCRINE A:   Hyperglycemia, mild P:   Resume SSI for glu > 180  NEUROLOGIC A:   H/O EtOH abuse Acute encephalopathy- resolved off sedation Severe baseline deconditioning P: Cont PRN fentanyl PT needed  Mcarthur Rossettianiel J. Tyson AliasFeinstein, MD, FACP Pgr: [phone]  Pulmonary & Critical Care

## 2014-08-16 NOTE — Evaluation (Signed)
Physical Therapy Evaluation Patient Details Name: Phillip May MRN: [id number] DOB: 1946/01/31 Today's Date: 08/16/2014   History of Present Illness  27 M with severe COPD, recent hospitalization for aspiration who was transferred to [facility] on 6/25 for worsening SOB likely 2/2 aspiration. In acute on chronic hypercarbic RF on arrival and placed on BiPAP; he did not improve and PCCM was consulted for further management.   Clinical Impression  Pt admitted with above diagnosis. Pt currently with functional limitations due to the deficits listed below (see PT Problem List). Pt able to sit EOB with +2 assist.  Limitied by desaturation to 77% with activity on 5LO2.  Will need to return to NH for therapy at d/c unless he has 24 hourassist.  Will follow acutely. Pt will benefit from skilled PT to increase their independence and safety with mobility to allow discharge to the venue listed below.      Follow Up Recommendations SNF;Supervision/Assistance - 24 hour    Equipment Recommendations  None recommended by PT    Recommendations for Other Services       Precautions / Restrictions Precautions Precautions: Fall Restrictions Weight Bearing Restrictions: No LLE Weight Bearing: Weight bearing as tolerated Other Position/Activity Restrictions: WBAT L LE      Mobility  Bed Mobility Overal bed mobility: Needs Assistance Bed Mobility: Rolling;Sidelying to Sit Rolling: +2 for physical assistance;Max assist Sidelying to sit: +2 for physical assistance;Max assist       General bed mobility comments: Assist for elevation of trunk and to bring LEs off bed as well as to  scoot to EOB.  Once B LE's on floor, pt was able to sit EOB x 5 min but with poor foward flex posture and mod posterior lean.    Transfers                    Ambulation/Gait                Stairs            Wheelchair Mobility    Modified Rankin (Stroke Patients Only)       Balance Overall  balance assessment: Needs assistance;History of Falls Sitting-balance support: Feet supported;Bilateral upper extremity supported Sitting balance-Leahy Scale: Poor Sitting balance - Comments: Could not sit up on own.  Sat 5 min with posterior lean and could not get balance.                                       Pertinent Vitals/Pain Pain Assessment: No/denies pain  O2 sats dropped to 77% on 5LO2 with activity and back to 96% at rest.  Will continue to monitor vitals.  Other VSS.     Home Living Family/patient expects to be discharged to:: Skilled nursing facility     Type of Home: House Home Access: Stairs to enter Entrance Stairs-Rails: Right Entrance Stairs-Number of Steps: 5 Home Layout: One level Home Equipment: Walker - 2 wheels;Cane - single point;Shower seat Additional Comments: Had been at [facility] for therapy for 7 days prior to this admit. Recent hip fx    Prior Function Level of Independence: Needs assistance   Gait / Transfers Assistance Needed: Therapy was working with pt at [facility] with RW.   ADL's / Homemaking Assistance Needed: mod to max assist at [facility]        Hand Dominance  Extremity/Trunk Assessment   Upper Extremity Assessment: Defer to OT evaluation           Lower Extremity Assessment: RLE deficits/detail;LLE deficits/detail RLE Deficits / Details: grossly 3-/5 LLE Deficits / Details: grossly 3-/5  Cervical / Trunk Assessment: Kyphotic  Communication   Communication:  (Mumbling and unable to understand speech)  Cognition Arousal/Alertness: Awake/alert Behavior During Therapy: Flat affect Overall Cognitive Status: History of cognitive impairments - at baseline Area of Impairment: Safety/judgement;Problem solving;Following commands     Memory: Decreased short-term memory Following Commands: Follows one step commands with increased time Safety/Judgement: Decreased awareness of safety;Decreased awareness of deficits    Problem Solving: Difficulty sequencing;Requires verbal cues;Requires tactile cues;Slow processing;Decreased initiation General Comments: Pt not remembering that he was in NH and came to hospital.      General Comments      Exercises        Assessment/Plan    PT Assessment Patient needs continued PT services  PT Diagnosis Generalized weakness   PT Problem List Decreased strength;Decreased activity tolerance;Decreased balance;Decreased mobility;Decreased knowledge of use of DME;Decreased cognition  PT Treatment Interventions DME instruction;Gait training;Functional mobility training;Therapeutic activities;Therapeutic exercise;Patient/family education;Balance training   PT Goals (Current goals can be found in the Care Plan section) Acute Rehab PT Goals Patient Stated Goal: none stated. PT Goal Formulation: With patient Time For Goal Achievement: 08/30/14 Potential to Achieve Goals: Fair    Frequency Min 2X/week   Barriers to discharge Decreased caregiver support      Co-evaluation               End of Session Equipment Utilized During Treatment: Gait belt;Oxygen Activity Tolerance: Patient limited by fatigue Patient left: in bed;with call bell/phone within reach;with bed alarm set Nurse Communication: Mobility status;Need for lift equipment         Time: 1359-1411 PT Time Calculation (min) (ACUTE ONLY): 12 min   Charges:   PT Evaluation $Initial PT Evaluation Tier I: 1 Procedure     PT G CodesBerline Lopes:        Gizzelle Lacomb F 08/16/2014, 3:48 PM Darlyn Repsher [facility],PT Acute Rehabilitation [phone] (pager)

## 2014-08-16 NOTE — Progress Notes (Signed)
[facility]OC[facility] FOR AM LAB REPLACEMENT ONLY  The patient does apply for the [facility] Electrolyte Re[facility]ment Protocol based on the criteria listed below:   1. Is GFR >/= 40 ml/min? Yes.    Patient's GFR today is >60 2. Is urine output >/= 0.5 ml/kg/hr for the last 6 hours? Yes.   Patient's UOP is 1.3 ml/kg/hr 3. Is BUN < 60 mg/dL? Yes.    Patient's BUN today is 16 4. Abnormal electrolyte(s): Potassium 3.6, Magnesium 1.5 5. Ordered repletion with: [facility] adult ICU replacement protocol 6. If a panic level lab has been reported, has the CCM MD in charge been notified? Yes.  .   Physician:  Dr. Loletha GrayerSteven Sommer  [facility] CampusRAMZAH, Alda BertholdYOUNKAI E 08/16/2014 6:26 AM

## 2014-08-16 NOTE — Progress Notes (Signed)
No current change in d/c plan. Patient is a resident of [facility].  Currently intubated. CSW will continue to monitor and assist with d/c needs when medically stable.  Lorri Frederickonna T. Jaci LazierCrowder, KentuckyLCSW [phone]

## 2014-08-17 LAB — PHOSPHORUS: Phosphorus: 2.9 mg/dL (ref 2.5–4.6)

## 2014-08-17 LAB — GLUCOSE, CAPILLARY
GLUCOSE-CAPILLARY: 115 mg/dL — AB (ref 65–99)
GLUCOSE-CAPILLARY: 88 mg/dL (ref 65–99)
Glucose-Capillary: 104 mg/dL — ABNORMAL HIGH (ref 65–99)
Glucose-Capillary: 134 mg/dL — ABNORMAL HIGH (ref 65–99)

## 2014-08-17 LAB — BASIC METABOLIC PANEL
Anion gap: 9 (ref 5–15)
BUN: 22 mg/dL — AB (ref 6–20)
CHLORIDE: 95 mmol/L — AB (ref 101–111)
CO2: 33 mmol/L — AB (ref 22–32)
Calcium: 8.4 mg/dL — ABNORMAL LOW (ref 8.9–10.3)
Creatinine, Ser: 0.65 mg/dL (ref 0.61–1.24)
GFR calc non Af Amer: >60 mL/min (ref >60)
Glucose, Bld: 124 mg/dL — ABNORMAL HIGH (ref 65–99)
POTASSIUM: 3.9 mmol/L (ref 3.5–5.1)
Sodium: 137 mmol/L (ref 135–145)

## 2014-08-17 LAB — MAGNESIUM: Magnesium: 2.4 mg/dL (ref 1.7–2.4)

## 2014-08-17 MED ORDER — OSMOLITE 1.2 CAL PO LIQD
1000.0000 mL | ORAL | Status: DC
  Administered 2014-08-17 – 2014-08-19 (×3): 1000 mL
  Filled 2014-08-17 (×8): qty 1000

## 2014-08-17 MED ORDER — ADULT MULTIVITAMIN LIQUID CH
5.0000 mL | Freq: Every day | ORAL | Status: DC
  Administered 2014-08-17 – 2014-08-19 (×3): 5 mL
  Filled 2014-08-17 (×4): qty 5

## 2014-08-17 MED ORDER — PRO-STAT SUGAR FREE PO LIQD
30.0000 mL | Freq: Every day | ORAL | Status: DC
Start: 2014-08-17 — End: 2014-08-20
  Administered 2014-08-17 – 2014-08-19 (×3): 30 mL
  Filled 2014-08-17 (×4): qty 30

## 2014-08-17 NOTE — Care Management (Signed)
Important Message  Patient Details  Name: Phillip May MRN: [id number] Date of Birth: 1945/12/07   Medicare Important Message Given:  Yes-third notification given    Bernadette HoitShoffner, Azeem Poorman Coleman 08/17/2014, 10:17 AM

## 2014-08-17 NOTE — Clinical Social Work Note (Signed)
Patient has transferred to unit 5W. CSW will follow for DC needs.   Roddie McBryant Hae Ahlers MSW, [place]CSWA, [place], [phone]

## 2014-08-17 NOTE — Progress Notes (Signed)
PULMONARY / CRITICAL CARE MEDICINE   Name: Phillip May MRN: [id number] DOB: 10-04-1945    ADMISSION DATE:  08/07/2014  CHIEF COMPLAINT:  SOB  INITIAL PRESENTATION: 42 M with severe COPD, recent hospitalization for aspiration who was transferred to [facility] on 6/25 for worsening SOB likely 2/2 aspiration. In acute on chronic hypercarbic RF on arrival and placed on BiPAP; he did not improve and PCCM was consulted for further management.    STUDIES:  ABG 7.232/72.9/85 --> 7.175/80.2/76.0 after starting BiPAP Chest X-ray 6/25 -- > Resolving RLL opacity  SIGNIFICANT EVENTS: Intubation 08/08/2014 6/27- discussion about plan of care 6/28- d/w son- wants full code up until maximal therapy given, abx duration then dnr and extubation then after further discussions. If coded, they would want one shot at resus then dnr 6/30 Not weanable. CXR gradually improving. Further discussion of goals of care with son. Recommended continued support for time limit (into early to mid part of next week) with one way extubation when status dictates and DNR 7/01 Much improved. Calmer. Tolerated PS 5-10 cm H2O. Copious secretions and intermittent agitation prohibited extubation 7/2 extubated 08/15/13 " did ok overnight, Nasal cann O2, NTS needed Neg 1 liter   SUBJECTIVE/OVERNIGHT/INTERVAL HX 08/17/14: Now DNR, DNI but with basic medical care. FOr further goals son awaited from [place]; supposed to have come yesterday but did not. RN says he is ECOG 4, frail and failure to thrive +  VITAL SIGNS: Temp:  [98 F (36.7 C)-99.8 F (37.7 C)] 98 F (36.7 C) (07/05 0800) Pulse Rate:  [58-98] 83 (07/05 0600) Resp:  [21-33] 25 (07/05 0600) BP: (99-126)/(45-89) 99/64 mmHg (07/05 0600) SpO2:  [89 %-100 %] 100 % (07/05 0600) HEMODYNAMICS:   VENTILATOR SETTINGS:   INTAKE / OUTPUT:  Intake/Output Summary (Last 24 hours) at 08/17/14 1042 Last data filed at 08/17/14 1000  Gross per 24 hour  Intake 1643.7 ml  Output   1885 ml   Net -241.3 ml    PHYSICAL EXAMINATION: General: Cachectic, RASS 0 follows commands, appropriate,poor phonation from secretions, said his name, Looks very wasted Neuro: CNs intact HEENT: temporal wasting, clears ronchi in throat Cardiovascular: Reg, no M Lungs: reduced, anterior cta Abdomen: scaphoid, NT, G tube site wnl Ext: warm, no edema  LABS: PULMONARY  Recent Labs Lab 08/15/14 0240  PHART 7.452*  PCO2ART 43.7  PO2ART 80.0  HCO3 30.0*  TCO2 31.3  O2SAT 96.8    CBC  Recent Labs Lab 08/12/14 0318 08/13/14 0345 08/14/14 0238  HGB 9.9* 9.9* 11.2*  HCT 29.6* 29.0* 33.8*  WBC 6.6 6.3 10.2  PLT 332 305 267    COAGULATION No results for input(s): INR in the last 168 hours.  CARDIAC  No results for input(s): TROPONINI in the last 168 hours. No results for input(s): PROBNP in the last 168 hours.   CHEMISTRY  Recent Labs Lab 08/11/14 0303 08/12/14 0318 08/13/14 0345 08/14/14 0238 08/16/14 0228 08/16/14 2120 08/17/14 0334  NA 134* 136 136 135 138  --  137  K 3.7 4.1 3.8 4.3 3.6  --  3.9  CL 95* 98* 97* 98* 93*  --  95*  CO2 36*  --  33*  GLUCOSE 125* 140* 148* 87 110*  --  124*  BUN 30* 28* 25* 25* 16  --  22*  CREATININE 1.08 0.72 0.61 0.58* 0.72  --  0.65  CALCIUM 8.2* 8.7* 8.8* 8.6* 8.7*  --  8.4*  MG 1.8 1.9  --   --  1.5* 2.6* 2.4  PHOS 3.5 2.2*  --   --  3.9  --  2.9   Estimated Creatinine Clearance: 60.9 mL/min (by C-G formula based on Cr of 0.65).   LIVER  Recent Labs Lab 08/13/14 0345  AST 28  ALT 25  ALKPHOS 58  BILITOT 0.5  PROT 6.2*  ALBUMIN 2.1*     INFECTIOUS  Recent Labs Lab 08/13/14 0345  PROCALCITON 1.26     ENDOCRINE CBG (last 3)   Recent Labs  08/16/14 1621 08/17/14 0020 08/17/14 0752  GLUCAP 90 134* 115*         IMAGING x48h  - image(s) personally visualized  -   highlighted in bold Dg Chest Port 1 View  08/16/2014   CLINICAL DATA:  Follow-up pneumonia.  EXAM: PORTABLE CHEST - 1  VIEW  COMPARISON:  08/15/2014  FINDINGS: Bibasilar airspace opacities are again noted, slightly improved since prior study. Somewhat limited study with patient's right arm and hand projecting over the right hemithorax. Small right pleural effusion is stable. Heart is upper limits normal in size.  IMPRESSION: Bibasilar opacities slightly improved since prior study. Small right effusion.   Electronically Signed   By: Charlett NoseKevin  Dover M.D.   On: 08/16/2014 07:55        ASSESSMENT / PLAN:  PULMONARY A:  Acute on chronic  hypoxic, hypercarbic resp failure due to RLL PNA with baseline Severe COPD (FEV1 < 1 liter in 2013)  - too frail to even tolerate bipap prn. DNI  P:   o2 NTS suction Pulm toilet  CARDIOVASCULAR A:  SR P:   Cont nebivolol  Dc   RENAL A:   AKI, resolved   P:   Even balance kvo   GASTROINTESTINAL A:   Chronic dysphagia and aspiration  G tube present Severe protein-calorie malnutrition  - on TF via peg (will aspirate otherwise)   P: SUP: enteral famotidine Cont TFs peg  HEMATOLOGIC A:   Chronic anemia without overt blood loss P:  DVT px: SQ heparin  INFECTIOUS A:   Severe R>L LL PNA, NOS R/o cdiff - negative P:   BCx2 6/25>>> NEG UC:6/25>>>NEG Sputum: 6/25>> NOF Abx:   Vanc 6/26>>> 6/28 Zosyn 6/26>>>6/28 Ceftriaxone 6/28>>>7/3  S/p full course abx, pcxr also better   ENDOCRINE A:   Hyperglycemia, mild P:   Resume SSI for glu > 180  NEUROLOGIC A:   H/O EtOH abuse Acute encephalopathy- resolved off sedation Severe baseline deconditioning   - no changed  P: Cont PRN fentanyl PT needed   GLOBAL  - overall poor prognosis with current illness and failure to thrive. He is DNR. DNI but futehr goals need clarification. Consulted pall care. Move out of ICU to med surg with PCCM off and TRH primary from 08/18/14. His Pall Performance Scale is very poor    Dr. Kalman ShanMurali Gara Kincade, M.D., [facility] Pulmonary and Critical Care  Medicine Staff Physician [facility] Pager: [phone], If no answer or between  15:00h - 7:00h: call [phone]  08/17/2014 10:51 AM

## 2014-08-17 NOTE — Progress Notes (Signed)
Nutrition Follow-up  DOCUMENTATION CODES:  Severe malnutrition in context of chronic illness Underweight  INTERVENTION:  D/C Vital AF 1.2  Start Osmolite 1.2 @ 50 ml/hr 30 ml Prostat daily  Provides: 1540 kcal, 81 grams protein, and 984 ml H2O Total free water: 1284 ml  NUTRITION DIAGNOSIS:  Malnutrition related to chronic illness as evidenced by severe depletion of body fat, severe depletion of muscle mass.  ongoing  GOAL:  Patient will meet greater than or equal to 90% of their needs  Not met  MONITOR:  TF tolerance, Skin, I & O's, Labs  REASON FOR ASSESSMENT:  Consult Enteral/tube feeding initiation and management  ASSESSMENT:  Hx severe COPD, recent hospitalization for aspiration.  PEG placed 6/15 Admitted with worsening SOB, intubated, extubated 7/2.   Palliative consult pending, pt now DNR and DNI. Son to decide other goals.  Labs reviewed: cbg: 90-134; Phosphorus and Magnesium WNL Medications reviewed and include: miralax, thiamine Pt discussed during ICU rounds and with RN.  Stage II pressure ulcer  Vital AF 1.2 @ 45 ml/hr: 1296 kcal, 81 grams protein, and 875 ml H2O Free water: 100 ml TID: 300 ml     Height:  Ht Readings from Last 1 Encounters:  08/07/14 _0  (1.702 m)    Weight:  Wt Readings from Last 1 Encounters:  08/12/14 107 lb 5.8 oz (48.7 kg)    Ideal Body Weight:  67.2 kg  Wt Readings from Last 10 Encounters:  08/12/14 107 lb 5.8 oz (48.7 kg)  07/30/14 101 lb 6.6 oz (46 kg)  05/21/11 126 lb (57.153 kg)  04/23/11 131 lb (59.421 kg)  03/13/11 128 lb 9.6 oz (58.333 kg)    BMI:  Body mass index is 16.81 kg/(m^2).  Estimated Nutritional Needs:  Kcal:  1325  Protein:  70-85 grams  Fluid:  > 1.3 L/day  Skin:  Wound (see comment) (stage II sacrum, MASD groin)  Diet Order:  Diet NPO time specified  EDUCATION NEEDS:  No education needs identified at this time   Intake/Output Summary (Last 24 hours) at 08/17/14  1220 Last data filed at 08/17/14 1100  Gross per 24 hour  Intake   1565 ml  Output   2235 ml  Net   -670 ml    Last BM:  7/4 - loose  Maylon Peppers RD, [place], CNSC [phone] Pager [phone] After Hours Pager

## 2014-08-17 NOTE — Progress Notes (Signed)
[name] is lying in bed. Very difficult to understand with much trouble trying to handle his copious secretions. Of note when speaking with him his breathe smells of tube feeding. I have reached out and left messages for both his son and daughter. I have not been a part of any of the discussion up to this point regarding his goals of care. I will await for return call from his children to further discuss goals/plan.   No charge  Yong ChannelAlicia Caliegh Middlekauff, NP Palliative Medicine Team Pager # [phone] (M-F 8a-5p) Team Phone # [phone] (Nights/Weekends)

## 2014-08-17 NOTE — Progress Notes (Signed)
Patient transferred to 5W 34 from 2H.  Oral and written handoff provided to receiving CSW- Roddie McBryant Campbell, LCSWA.  [facility] admission to the hospital. Per SW Assessment completed by Tomi BambergerVanessa Campbell- patient's family requests additional SNF search search but are also open to return to [facility] if unable to locate another bed. [facility] bed [facility].    Patient has a PEG and Palliative Care services is currently involved. CSW spoke with Yong ChannelAlicia Parker, NP about patient. She is attempting to reach son or daughter to discuss and clarify goals of care for patient.  This CSW will sign off.  Lorri Frederickonna T. Jaci LazierCrowder, KentuckyLCSW [phone]

## 2014-08-17 NOTE — Progress Notes (Signed)
I have received a call back from Phillip May's son and he says that his sister is travelling today but he is very much interested in having a discussion about his father's condition - he says he knows it is bad and he does have questions. He will check with his sister and we will hopefully schedule a conversation for tomorrow 7/6 morning - will await to hear back with official time.   Yong ChannelAlicia Joseph Johns, NP Palliative Medicine Team Pager # [phone] (M-F 8a-5p) Team Phone # [phone] (Nights/Weekends)

## 2014-08-17 NOTE — Care Management (Signed)
Important Message  Patient Details  Name: Phillip May MRN: [id number] Date of Birth: [birth year]   Medicare Important Message Given:  Yes-third notification given    Hanley HaysDowell, Krayton Wortley T, RN 08/17/2014, 9:21 AM

## 2014-08-18 DIAGNOSIS — Z515 Encounter for palliative care: Secondary | ICD-10-CM

## 2014-08-18 DIAGNOSIS — R0600 Dyspnea, unspecified: Secondary | ICD-10-CM

## 2014-08-18 DIAGNOSIS — Z66 Do not resuscitate: Secondary | ICD-10-CM

## 2014-08-18 LAB — GLUCOSE, CAPILLARY
Glucose-Capillary: 117 mg/dL — ABNORMAL HIGH (ref 65–99)
Glucose-Capillary: 130 mg/dL — ABNORMAL HIGH (ref 65–99)

## 2014-08-18 NOTE — Consult Note (Signed)
Consultation Note Date: 08/18/2014   Patient Name: Phillip May  DOB: Jul 06, 1945  MRN: [id number]  Age / Sex: 69 y.o., male   PCP: Phillip HousekeeperKarrar Husain, MD Referring Physician: Richarda OverlieNayana Abrol, MD  Reason for Consultation: Establishing goals of care and Non pain symptom management, hospice eligibility  Palliative Care Assessment and Plan Summary of Established Goals of Care and Medical Treatment Preferences    Palliative Care Discussion Held Today:   Phillip May appears miserable today and has been dealing with copious secretions. His speech is very garbled and difficult to understand. I was able to get him to nod his head to a few yes/no questions. When I asked if he felt bad today - yes. I asked if he was miserable - yes. I asked if he wanted us to just focus on keeping him comfortable - yes. Even if this means you could die - he closed his eyes and did not respond to me. Asked if he just wanted to rest - yes. Difficult to tell how much he understands/comprehends.   I shared this conversation with his son, Phillip May, and daughter, Phillip May. Explained very poor prognosis and that he seems to be continuing to aspirate on tube feeding (breathe smells like tube feeding) and even his own aspirations. I do feel tube feeding are making this worse for him and very uncomfortable. Explained that it is only a matter of time before he is in the same situation as when he came into the hospital but he has decided he does not want to be put back on the ventilator - family supports this decision. Educated that his dysphagia/aspiration will not go away and unfortunately there is very little we can do. I explained and recommended consideration of stopping tube feedings and transition to comfort care/hospice. They are open to this option but need more time to let this information sink in. All questions answered. Emotional support provided. I will follow up tomorrow.    Contacts/Participants in Discussion: Primary Decision  Maker: Son and daughter, Phillip May and [place]ajah  Goals of Care/Code Status/Advance Care Planning:   Code Status: DNR  Symptom Management:   Secretions: Recommend Robinul 0.4 mg every 4 hours and d/c tube feedings.   Dyspnea: Recommend morphine 2 mg IV every hour prn if transition to comfort care.   Psycho-social/Spiritual:   Support System: Minimal - children are supportive but live out of town.   Prognosis: < 2-4 weeks  Discharge Planning:  To be determined.        Chief Complaint: Respiratory failure  History of Present Illness: 69 yo with severe COPD, h/o aspiration, recent PEG placement, h/o tobacco and alcohol abuse. Admitted with aspiration pneumonia requiring mechanical ventilation. He was made DNR and one way extubation per notes according to his own wishes elicited by PCCM but otherwise continuing aggressive care. Prognosis extremely poor.   Primary Diagnoses  Present on Admission:  . Acute respiratory failure with hypercapnia . Aspiration pneumonia . Hyponatremia . Hyperglycemia . Respiratory failure  Palliative Review of Systems:   Unable to assess - speech garbled and difficult to understand. He shakes head no to my question if anything is bothering him.    I have reviewed the medical record, interviewed the patient and family, and examined the patient. The following aspects are pertinent.  Past Medical History  Diagnosis Date  . Inguinal hernia   . COPD (chronic obstructive pulmonary disease)   . GERD (gastroesophageal reflux disease)   . Chronic hoarseness   . Tobacco  abuse   . Cervical spinal stenosis     C4-C5; C5-C6; severe stenosis and abnormal  cord signal C6-C7 s/p decompression in 04/2004  . Hypertension    History   Social History  . Marital Status: Divorced    Spouse Name: N/A  . Number of Children: N/A  . Years of Education: N/A   Occupational History  . investigator for [employer]    Social History Main Topics  . Smoking status:  Current Every Day Smoker -- 0.20 packs/day for 30 years    Types: Cigarettes  . Smokeless tobacco: Never Used     Comment: 3-4 cigs a day  . Alcohol Use: 7.0 oz/week    14 Standard drinks or equivalent per week     Comment: 3-4 shots of liquor a day  . Drug Use: No  . Sexual Activity: Not on file   Other Topics Concern  . None   Social History Narrative   Family History  Problem Relation Age of Onset  . Emphysema Father   . Stomach cancer Sister    Scheduled Meds: . acetylcysteine  4 mL Nebulization BID  . antiseptic oral rinse  7 mL Mouth Rinse QID  . budesonide  0.25 mg Nebulization Q6H  . chlorhexidine  15 mL Mouth Rinse BID  . famotidine  20 mg Per Tube BID  . feeding supplement (PRO-STAT SUGAR FREE 64)  30 mL Per Tube Daily  . free water  100 mL Per Tube 3 times per day  . furosemide  40 mg Intravenous Daily  . heparin subcutaneous  5,000 Units Subcutaneous 3 times per day  . ipratropium-albuterol  3 mL Nebulization Q6H  . multivitamin  5 mL Per Tube Daily  . nebivolol  10 mg Per J Tube Daily  . polyethylene glycol  17 g Oral Daily  . QUEtiapine  50 mg Per Tube QHS  . thiamine  100 mg Per J Tube Daily   Continuous Infusions: . sodium chloride 10 mL/hr at 08/17/14 0800  . feeding supplement (OSMOLITE 1.2 CAL) 1,000 mL (08/17/14 1814)   PRN Meds:.acetaminophen **OR** [DISCONTINUED] acetaminophen, albuterol, fentaNYL (SUBLIMAZE) injection, hydrALAZINE, midazolam Medications Prior to Admission:  Prior to Admission medications   Medication Sig Start Date End Date Taking? Authorizing Provider  budesonide-formoterol (SYMBICORT) 160-4.5 MCG/ACT inhaler Inhale 2 puffs into the lungs 2 (two) times daily.   Yes Historical Provider, MD  enoxaparin (LOVENOX) 30 MG/0.3ML injection Inject 0.3 mLs (30 mg total) into the skin daily. FOR 30 DAYS FROM 07/20/14 and then stop 07/30/14  Yes Shanker Levora Dredge, MD  folic acid (FOLVITE) 1 MG tablet Take 1 tablet (1 mg total) by mouth daily.  07/30/14  Yes Shanker Levora Dredge, MD  guaiFENesin (MUCINEX) 600 MG 12 hr tablet Take 600 mg by mouth 2 (two) times daily.    Yes Historical Provider, MD  HYDROcodone-acetaminophen (NORCO/VICODIN) 5-325 MG per tablet Take 1-2 tablets by mouth every 6 (six) hours as needed for moderate pain. 07/30/14  Yes Shanker Levora Dredge, MD  levalbuterol (XOPENEX) 1.25 MG/0.5ML nebulizer solution Take 1.25 mg by nebulization 3 (three) times daily. 07/30/14  Yes Shanker Levora Dredge, MD  Multiple Vitamin (MULTIVITAMIN WITH MINERALS) TABS tablet Take 1 tablet by mouth daily. 07/30/14  Yes Shanker Levora Dredge, MD  nebivolol (BYSTOLIC) 5 MG tablet Take 1 tablet (5 mg total) by mouth daily. 07/30/14  Yes Shanker Levora Dredge, MD  Nutritional Supplements (FEEDING SUPPLEMENT, OSMOLITE 1.2 CAL,) LIQD Place 1,000 mLs into feeding tube  continuous. Goal of 40 cc/hr Patient taking differently: Place 60 mL/hr into feeding tube continuous.  07/30/14  Yes Shanker Levora Dredge, MD  thiamine 100 MG tablet Take 1 tablet (100 mg total) by mouth daily. 07/30/14  Yes Shanker Levora Dredge, MD  tiotropium (SPIRIVA HANDIHALER) 18 MCG inhalation capsule Place 1 capsule (18 mcg total) into inhaler and inhale daily. 07/30/14 07/30/15 Yes Shanker Levora Dredge, MD  Water For Irrigation, Sterile (FREE WATER) SOLN Place 150 mLs into feeding tube every 6 (six) hours.   Yes Historical Provider, MD   No Known Allergies CBC:    Component Value Date/Time   WBC 10.2 08/14/2014 0238   HGB 11.2* 08/14/2014 0238   HCT 33.8* 08/14/2014 0238   PLT 267 08/14/2014 0238   MCV 93.9 08/14/2014 0238   NEUTROABS 9.3* 08/10/2014 0358   LYMPHSABS 0.4* 08/10/2014 0358   MONOABS 0.4 08/10/2014 0358   EOSABS 0.0 08/10/2014 0358   BASOSABS 0.0 08/10/2014 0358   Comprehensive Metabolic Panel:    Component Value Date/Time   NA 137 08/17/2014 0334   K 3.9 08/17/2014 0334   CL 95* 08/17/2014 0334   CO2 33* 08/17/2014 0334   BUN 22* 08/17/2014 0334   CREATININE 0.65 08/17/2014 0334     GLUCOSE 124* 08/17/2014 0334   CALCIUM 8.4* 08/17/2014 0334   AST 28 08/13/2014 0345   ALT 25 08/13/2014 0345   ALKPHOS 58 08/13/2014 0345   BILITOT 0.5 08/13/2014 0345   PROT 6.2* 08/13/2014 0345   ALBUMIN 2.1* 08/13/2014 0345    Physical Exam:  Vital Signs: BP 136/70 mmHg  Pulse 54  Temp(Src) 99.1 F (37.3 C) (Oral)  Resp 18  Ht  (1.702 m)  Wt 43.4 kg (95 lb 10.9 oz)  BMI 14.98 kg/m2  SpO2 96% SpO2: SpO2: 96 % O2 Device: O2 Device: Nasal Cannula O2 Flow Rate: O2 Flow Rate (L/min): 5 L/min Intake/output summary:  Intake/Output Summary (Last 24 hours) at 08/18/14 0750 Last data filed at 08/18/14 0515  Gross per 24 hour  Intake 999.33 ml  Output   1360 ml  Net -360.67 ml   LBM: Last BM Date: 08/17/14 Baseline Weight: Weight: 45.904 kg (101 lb 3.2 oz) Most recent weight: Weight: 43.4 kg (95 lb 10.9 oz)  Exam Findings:   General: Lying in bed HEENT: Temporal muscle wasting CVS: RRR Resp: Breathing somewhat labored, rhonchi throughout Abd: Soft, NT, ND Extrem: Warm, dry Neuro: Arousable, alert, oriented to person but otherwise difficult to assess d/t copious secretions and garbled speech           Palliative Performance Scale: 20 %                Additional Data Reviewed: Recent Labs     08/16/14  0228  08/17/14  0334  NA  138  137  BUN  16  22*  CREATININE  0.72  0.65     Time In/Out: 1200-1230, 1320-1400 Time Total:  Greater than 50%  of this time was spent counseling and coordinating care related to the above assessment and plan.   Signed by:  Yong Channel, NP Palliative Medicine Team Pager # [phone] (M-F 8a-5p) Team Phone # [phone] (Nights/Weekends)

## 2014-08-18 NOTE — Progress Notes (Signed)
PT Cancellation Note  Patient Details Name: Francene CastleJames L Summerlin MRN: [id number] DOB: 08-04-1945   Cancelled Treatment:    Reason Eval/Treat Not Completed: Medical issues which prohibited therapy. Spoke with nurse, pt not feeling well this morning. Family meeting planned regarding POC. Will re-attempt to see pt at next available time.   [place], [place]ittany N PT  08/18/2014, 9:19 AM

## 2014-08-18 NOTE — Clinical Social Work Note (Signed)
CSW spoke with patient's daughter by phone. She reports that the family does NOT want the patient to return to [facility]. Family has spoke with PMT, they state they want to discuss the options as a family and make a decision together regarding next steps. In the meantime, family is agreeable to sending referrals to other SNFs in the event they do not choose full comfort care. If they choose full comfort [facility].   Roddie McBryant Narda Fundora MSW, [place]CSWA, [place], [phone]

## 2014-08-18 NOTE — Progress Notes (Signed)
Long discussion with family today regarding choices, comfort care, and poor prognosis. They were receptive and are considering comfort care (in which I recommended hospice). Full note to follow.   Yong ChannelAlicia Saddie Sandeen, NP Palliative Medicine Team Pager # [phone] (M-F 8a-5p) Team Phone # [phone] (Nights/Weekends)

## 2014-08-18 NOTE — Progress Notes (Signed)
[facility] PROGRESS NOTE  Phillip May [name] DOB: January 20, 1946 DOA: 08/07/2014 PCP: Phillip Housekeeper, MD  Assessment/Plan: Principal Problem:   Acute respiratory failure with hypercapnia Active Problems:   Aspiration pneumonia   Hyponatremia   Hyperglycemia   Respiratory failure   Pressure ulcer   Endotracheally intubated   Pneumonia   Endotracheal tube present    Acute on chronic hypoxic, hypercarbic resp failure due to RLL PNA with baseline Severe COPD (FEV1 < 1 liter in 2013)- too frail to even tolerate bipap prn. DNI Continue supportive care, pulmonary toilet, when necessary nebs   Chronic dysphagia and aspiration ,G tube present,Severe protein-calorie malnutrition - on TF via peg , deemed high risk for aspiration Last speech therapy evaluation was 6/15, they recommended  Dysphagia 3 (Mech soft);Thin for comfort only, currently the patient is nothing by mouth and receiving tube feeds  Chronic anemia without overt blood loss DVT px: SQ heparin, no CBC checked since 7/2, hold off on labs pending palliative care discussion   Severe R>L LL PNA, NOS,R/o cdiff - negative Blood cultures no growth so far, completed full course of antibiotics treatment for pneumonia Last chest x-ray was done on 7/4 that shows improvement  Hyperglycemia, continue sliding scale insulin  H/O EtOH abuse Acute encephalopathy- resolved off sedation Severe baseline deconditioning overall poor prognosis with current illness and failure to thrive. He is DNR. DNI but futehr goals need clarification. Consulted pall care. Continue med surg . TRH primary from 08/18/14. His Pall Performance Scale is very poor  Code Status:      Code Status Orders        Start     Ordered   08/15/14 1251  Do not attempt resuscitation (DNR)   Continuous    Question Answer Comment  In the event of cardiac or respiratory ARREST Do not call a "code blue"   In the event of cardiac or respiratory  ARREST Do not perform Intubation, CPR, defibrillation or ACLS   In the event of cardiac or respiratory ARREST Use medication by any route, position, wound care, and other measures to relive pain and suffering. May use oxygen, suction and manual treatment of airway obstruction as needed for comfort.      08/15/14 1250     Family Communication: family updated about patient's clinical progress Disposition Plan:  Palliative care consultation pending   Brief narrative: 69 M with severe COPD, recent hospitalization for aspiration who was transferred to [facility] on 6/25 for worsening SOB likely 2/2 aspiration. In acute on chronic hypercarbic RF on arrival and placed on BiPAP; he did not improve and PCCM was consulted for further management.   Consultants:  Critical care  Procedures:  Intubation from 6/26-7/2    Antibiotics: Anti-infectives    Start     Dose/Rate Route Frequency Ordered Stop   08/10/14 1030  cefTRIAXone (ROCEPHIN) 1 g in dextrose 5 % 50 mL IVPB - Premix     1 g 100 mL/hr over 30 Minutes Intravenous Every 24 hours 08/10/14 0941 08/15/14 1149   08/09/14 0600  vancomycin (VANCOCIN) IVPB 750 mg/150 ml premix  Status:  Discontinued     750 mg 150 mL/hr over 60 Minutes Intravenous Every 24 hours 08/08/14 0222 08/10/14 0941   08/08/14 0400  piperacillin-tazobactam (ZOSYN) IVPB 3.375 g  Status:  Discontinued     3.375 g 12.5 mL/hr over 240 Minutes Intravenous 3 times per day 08/08/14 0044 08/10/14 0941   08/08/14 0300  vancomycin (  VANCOCIN) IVPB 1000 mg/200 mL premix     1,000 mg 200 mL/hr over 60 Minutes Intravenous  Once 08/08/14 0222 08/08/14 0542   08/07/14 2130  piperacillin-tazobactam (ZOSYN) IVPB 3.375 g     3.375 g 100 mL/hr over 30 Minutes Intravenous  Once 08/07/14 2115 08/07/14 2252         HPI/Subjective: Complaining of generalized weakness, hypoxic on 5 L of oxygen, low-grade fever,  Objective: Filed Vitals:   08/18/14 0545 08/18/14 0736 08/18/14 0747  08/18/14 0953  BP: 136/70   108/68  Pulse: 54   94  Temp: 99.1 F (37.3 C)   99.3 F (37.4 C)  TempSrc: Oral   Oral  Resp: 18   34  Height:      Weight:      SpO2: 91% 96% 96% 93%    Intake/Output Summary (Last 24 hours) at 08/18/14 1208 Last data filed at 08/18/14 5366  Gross per 24 hour  Intake 724.33 ml  Output    850 ml  Net -125.67 ml    Exam:  General: Cachectic, RASS 0 follows commands, appropriate,poor phonation from secretions, said his name, Looks very wasted Neuro: CNs intact HEENT: temporal wasting, clears ronchi in throat Cardiovascular: Reg, no M Lungs: reduced, anterior cta Abdomen: scaphoid, NT, G tube site wnl Ext: warm, no edema  Micro Results Recent Results (from the past 240 hour(s))  Clostridium Difficile by PCR (not at [facility])     Status: None   Collection Time: 08/14/14  6:27 AM  Result Value Ref Range Status   C difficile by pcr NEGATIVE NEGATIVE Final    Radiology Reports Ct Abdomen Wo Contrast  07/21/2014   CLINICAL DATA:  69 year old male with generalized weakness. Evaluate anatomy prior to gastrostomy tube placement. History of dysphagia.  EXAM: CT ABDOMEN WITHOUT CONTRAST  TECHNIQUE: Multidetector CT imaging of the abdomen was performed following the standard protocol without IV contrast.  COMPARISON:  No priors.  FINDINGS: Lower chest: Areas of airspace consolidation in the right middle lobe and right lower lobe. Small right pleural effusion layering dependently. Areas of bronchiectasis and potential cavitation in the posterior aspect of the right lower lobe, poorly evaluated on today's noncontrast CT examination. 1.6 x 1.0 cm nodular density in the medial aspect of the left lower lobe (image 13 of series 5). Trace left pleural effusion. Atherosclerotic calcifications in the left anterior descending and right coronary arteries.  Hepatobiliary: No definite cystic or solid hepatic lesions are identified on today's noncontrast CT examination.  Gallbladder is not confidently identified and could be collapsed or surgically absent (no surgical clips are noted in the region of the gallbladder fossa).  Pancreas: Pancreas is poorly depicted, but grossly unremarkable.  Spleen: Unremarkable.  Adrenals/Urinary Tract: The unenhanced appearance of the kidneys and adrenal glands is normal bilaterally. No hydroureteronephrosis.  Stomach/Bowel: The unenhanced appearance of the visualized portions of the stomach is unremarkable. Importantly, however, there are loops of small bowel and colon superficial to the visualized portions of the stomach. No pathologic dilatation of the visualized portions of the small bowel or colon.  Vascular/Lymphatic: Atherosclerosis throughout the visualized abdominal vasculature, without definite aneurysm. No definite lymphadenopathy in the visualized portions of the abdomen on today's noncontrast CT examination.  Other: No significant volume of ascites or definite pneumoperitoneum identified in the abdomen.  Musculoskeletal: Chronic appearing compression fracture of L1 with approximately 20% loss of anterior vertebral body height. There are no aggressive appearing lytic or blastic lesions noted in the visualized  portions of the skeleton.  IMPRESSION: 1. Stomach is incompletely visualized on today's examination. The visualized portions of the stomach are remarkable for superficial loops of small bowel and colon interposed between the stomach in the anterior abdominal wall. 2. Airspace consolidation in the right middle and lower lobes concerning for acute infection. This appears be associated with some areas of bronchiectasis and potential cavitation in the posterior aspect of the right lower lobe. There is also a small right parapneumonic pleural effusion. 3. Pleural-based 1.6 x 1.0 cm nodular density in the medial aspect of the left lower lobe. This could also be infectious or inflammatory in etiology, however, the possibility of neoplasm  is not excluded, and close attention on future followup examinations is recommended. There is also trace left pleural effusion. At this time, noncontrast chest CT is recommended in 2-3 weeks to assess for resolution of the right lower/middle lobe pneumonia following trial of antimicrobial therapy, and to reassess this left lower lobe nodular region. 4. Extensive atherosclerosis, including at least 2 vessel coronary artery disease. Please note that although the presence of coronary artery calcium documents the presence of coronary artery disease, the severity of this disease and any potential stenosis cannot be assessed on this non-gated CT examination. Assessment for potential risk factor modification, dietary therapy or pharmacologic therapy may be warranted, if clinically indicated. 5. Additional incidental findings, as above.   Electronically Signed   By: Trudie Reedaniel  Entrikin M.D.   On: 07/21/2014 20:26   Ct Head Wo Contrast  07/19/2014   CLINICAL DATA:  Headache, hallucinating objects  EXAM: CT HEAD WITHOUT CONTRAST  TECHNIQUE: Contiguous axial images were obtained from the base of the skull through the vertex without intravenous contrast.  COMPARISON:  None  FINDINGS: There is no evidence of mass effect, midline shift, or extra-axial fluid collections. There is no evidence of a space-occupying lesion or intracranial hemorrhage. There is no evidence of a cortical-based area of acute infarction. 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 There is generalized cerebral atrophy. There is periventricular white matter low attenuation likely secondary to microangiopathy.  The ventricles and sulci are appropriate for the patient's age. The basal cisterns are patent.  Visualized portions of the orbits are unremarkable. There is near complete opacification of the left maxillary sinus. There is an air-fluid level in the right sphenoid sinus. There is mild left ethmoid sinus mucosal thickening. Cerebrovascular  atherosclerotic calcifications are noted.  The osseous structures are unremarkable.  IMPRESSION: 1. No acute intracranial pathology. 2. Chronic microvascular disease and cerebral atrophy. 3. Right sphenoid and left maxillary sinus disease. Mild left ethmoid sinus disease.   Electronically Signed   By: Elige KoHetal  Patel   On: 07/19/2014 18:01   Ct Angio Chest Pe W/cm &/or Wo Cm  08/08/2014   CLINICAL DATA:  Acute onset of respiratory distress. Initial encounter.  EXAM: CT ANGIOGRAPHY CHEST WITH CONTRAST  TECHNIQUE: Multidetector CT imaging of the chest was performed using the standard protocol during bolus administration of intravenous contrast. Multiplanar CT image reconstructions and MIPs were obtained to evaluate the vascular anatomy.  CONTRAST:  80mL OMNIPAQUE IOHEXOL 350 MG/ML SOLN  COMPARISON:  Chest radiograph performed earlier today at 2:27 a.m.  FINDINGS: There is no evidence of pulmonary embolus.  There is dense opacification of both lower lung lobes, concerning for bilateral lower lobe pneumonia. Minimal patchy opacities are seen within the right upper lobe. Bilateral emphysematous change is seen at the upper lung lobes. No definite pleural effusion or pneumothorax  is seen. There is layering fluid within the right mainstem bronchus, concerning for aspiration. No masses are identified; no abnormal focal contrast enhancement is seen.  The mediastinum is unremarkable in appearance. No mediastinal lymphadenopathy is seen. No pericardial effusion is identified. The great vessels are grossly unremarkable in appearance. The patient's endotracheal tube is seen ending 3 cm above the carina. No axillary lymphadenopathy is seen. The thyroid gland is unremarkable in appearance.  The visualized portions of the liver and spleen are unremarkable. The pancreas, adrenal glands and kidneys are not well assessed due to surrounding edema. Scattered calcification is noted along the proximal abdominal aorta.  No acute osseous  abnormalities are seen. Cervical spinal fusion hardware is noted. There is mild chronic compression deformity involving vertebral body L1.  Review of the MIP images confirms the above findings.  IMPRESSION: 1. No evidence of pulmonary embolus. 2. Dense bilateral lower lobe airspace opacification, compatible with bilateral lower lobe pneumonia. Mild patchy opacities seen within the right upper lobe. Layering fluid noted within the right mainstem bronchus, concerning for aspiration. 3. Bilateral emphysematous change at the upper lung lobes.   Electronically Signed   By: Roanna Raider M.D.   On: 08/08/2014 04:08   Pelvis Portable  07/20/2014   CLINICAL DATA:  69 year old status post left hip repair  EXAM: PORTABLE PELVIS 1-2 VIEWS  COMPARISON:  Preoperative radiographs 07/19/2014  FINDINGS: Interval ORIF of intertrochanteric left femoral fracture with intra medullary nail and a transfemoral neck gamma nail. Improved alignment of the fracture fragments. No evidence of acute hardware complication. Expected postoperative subcutaneous emphysema. Oral contrast material opacifies the colon. Scattered atherosclerotic vascular calcifications are noted.  IMPRESSION: ORIF left intertrochanteric femoral fracture without evidence of complication.   Electronically Signed   By: Malachy Moan M.D.   On: 07/20/2014 21:15   Ir Fluoro Rm 30-60 Min  07/23/2014   CLINICAL DATA:  Dysphagia, aspiration pneumonia. Request for enteral feeding support. Recent CT had demonstrated no safe percutaneous approach to the nondistended stomach due to overlying small bowel and colon.  EXAM: IR FLOURO RM 0-60 MIN  COMPARISON:  CT 07/21/2014  TECHNIQUE: Patient was positioned prone on the supine on the procedure table.  Intravenous Fentanyl and Versed were administered as conscious sedation during continuous cardiorespiratory monitoring by the radiology RN, with a total moderate sedation time of less than 30 minutes.  A 5 French angled  angiographic catheter placed as orogastric tube to allow gastric insufflation with air. Under fluoroscopy, using multiple obliquities, it was apparent that the redundant transverse colon and loops of small bowel continued overlie the gastric body and antrum, such that no safe percutaneous approach was available for gastrostomy tube placement.  IMPRESSION: 1. No safe percutaneous approach for gastrostomy tube placement due to overlying bowel. Consider surgical placement if needed.   Electronically Signed   By: Corlis Leak M.D.   On: 07/23/2014 08:10   Dg Chest Port 1 View  08/16/2014   CLINICAL DATA:  Follow-up pneumonia.  EXAM: PORTABLE CHEST - 1 VIEW  COMPARISON:  08/15/2014  FINDINGS: Bibasilar airspace opacities are again noted, slightly improved since prior study. Somewhat limited study with patient's right arm and hand projecting over the right hemithorax. Small right pleural effusion is stable. Heart is upper limits normal in size.  IMPRESSION: Bibasilar opacities slightly improved since prior study. Small right effusion.   Electronically Signed   By: Charlett Nose M.D.   On: 08/16/2014 07:55   Dg Chest [facility]  08/15/2014   CLINICAL DATA:  Pneumonia.  EXAM: PORTABLE CHEST - 1 VIEW  COMPARISON:  11/14/2014.  FINDINGS: Lower cervical ACDF.  Interval extubation.  Monitoring leads project over the chest.  No pneumothorax is present. Emphysema. Curvilinear density is present at the RIGHT lung base, probably representing RIGHT middle lobe atelectasis. There is a RIGHT pleural effusion, with probable subsegmental atelectasis in the RIGHT lower lobe.  Compared to yesterday's exam, there is increasing airspace opacity at the LEFT base, extending from retrocardiac region to the LEFT costophrenic angle.  IMPRESSION: 1. Interval extubation. 2. Shifting airspace disease with increasing airspace opacity at the LEFT lung base, probably representing combination of airspace disease, atelectasis and small effusion. 3.  Opacity at the RIGHT lung base likely represents RIGHT middle lobe atelectasis and shifting pleural effusion. Persistent RIGHT lower lobe consolidation.   Electronically Signed   By: Andreas Newport M.D.   On: 08/15/2014 10:46   Dg Chest Port 1 View  08/14/2014   CLINICAL DATA:  Severe COPD. Respiratory failure. Worsening shortness of breath  EXAM: PORTABLE CHEST - 1 VIEW  COMPARISON:  08/13/2014  FINDINGS: Endotracheal tube remains in place, unchanged. Continued persistent density at the right lung base. No confluent opacity on the left. Heart is normal size. Tortuosity of the thoracic aorta. No definite significant effusion. No acute bony abnormality.  IMPRESSION: Continued right basilar airspace opacity.  No significant change.   Electronically Signed   By: Charlett Nose M.D.   On: 08/14/2014 07:11   Dg Chest Port 1 View  08/13/2014   CLINICAL DATA:  Respiratory failure  EXAM: PORTABLE CHEST - 1 VIEW  COMPARISON:  Portable chest x-ray of August 12, 2014  FINDINGS: The left lung is well-expanded. There is minimal persistent subsegmental atelectasis inferioromedially on the left. On the right there is persistent increased density at the lung base. The cardiac silhouette is not enlarged. There is tortuosity of the ascending and descending thoracic aorta. The endotracheal tube tip lies 3.2 cm above the crotch of the carina.  IMPRESSION: Essentially stable appearance of the chest since yesterday's study. The endotracheal tube is in reasonable position.   Electronically Signed   By: David  [place] M.D.   On: 08/13/2014 07:42   Dg Chest Port 1 View  08/12/2014   CLINICAL DATA:  Followup exam.  Intubated patient.  EXAM: PORTABLE CHEST - 1 VIEW  COMPARISON:  08/12/2014 at 4:54 a.m.  FINDINGS: Endotracheal tube tip projects 3.5 cm above the carina.  Hazy opacity at the right lung base consistent with combination of atelectasis and pleural fluid is similar to the prior exam. Opacity at the left lung base has improved  from prior studies, but is similar to the most recent exam, likely also atelectasis. No pulmonary edema. No pneumothorax.  Cardiac silhouette is normal in size.  Aorta is uncoiled.  IMPRESSION: 1. No significant change from the earlier chest radiograph allowing for differences in patient positioning and technique. 2. Endotracheal tube is well positioned. 3. Right greater than left lung base opacity is stable   Electronically Signed   By: Amie Portland M.D.   On: 08/12/2014 17:57   Dg Chest Port 1 View  08/12/2014   CLINICAL DATA:  Respiratory failure.  Endotracheal intubation.  EXAM: PORTABLE CHEST - 1 VIEW  COMPARISON:  08/11/2014  FINDINGS: The endotracheal tube is 4.6 cm above the carina. There is persistent basilar opacity, right greater than left, unchanged. There is tortuosity and ectasia of the aorta, unchanged. There  is no large effusion. There is no pneumothorax  IMPRESSION: No significant interval change in the bilateral airspace opacities.  Support equipment appears satisfactorily positioned.   Electronically Signed   By: Ellery Plunk M.D.   On: 08/12/2014 06:30   Dg Chest Port 1 View  08/11/2014   CLINICAL DATA:  Hypoxia  EXAM: PORTABLE CHEST - 1 VIEW  COMPARISON:  August 10, 2014  FINDINGS: Endotracheal tube tip is 3.9 cm above the carina. No pneumothorax. There is a right pleural effusion. There is patchy atelectasis in the lung bases. Lungs elsewhere clear. The heart is upper normal in size with pulmonary vascularity within normal limits. Aorta is tortuous. No adenopathy.  IMPRESSION: Endotracheal tube as described. No pneumothorax. Persistent right pleural effusion with patchy bibasilar atelectasis. No new parenchymal lung opacity. No change in cardiac silhouette.   Electronically Signed   By: Bretta Bang III M.D.   On: 08/11/2014 08:03   Dg Chest Port 1 View  08/10/2014   CLINICAL DATA:  Pneumonia  EXAM: PORTABLE CHEST - 1 VIEW  COMPARISON:  08/09/2014  FINDINGS: The endotracheal  tube is 3.7 cm above the carina. Basilar airspace opacities persist bilaterally. There probably are small effusions. There is no pneumothorax.  IMPRESSION: Satisfactory ET tube position. Unchanged basilar airspace opacities and probable small effusions.   Electronically Signed   By: Ellery Plunk M.D.   On: 08/10/2014 05:13   Dg Chest Port 1 View  08/09/2014   CLINICAL DATA:  Pneumonia, history COPD, hypertension, smoker  EXAM: PORTABLE CHEST - 1 VIEW  COMPARISON:  Portable exam 0458 hours compared to 08/08/2014  FINDINGS: Rotated to the LEFT.  Tip of endotracheal tube projects 9.9 cm above carina.  Normal heart size, mediastinal contours and pulmonary vascularity.  BILATERAL lower lobe infiltrates.  Underlying emphysematous changes.  No gross pleural effusion or pneumothorax.  Diffuse osseous demineralization.  IMPRESSION: COPD changes with BILATERAL lower lobe infiltrates consistent with pneumonia.  High position of endotracheal tube; may advance tube 5 cm for more stable positioning.   Electronically Signed   By: Ulyses Southward M.D.   On: 08/09/2014 07:17   Dg Chest Port 1 View  08/08/2014   CLINICAL DATA:  ET tube placement  EXAM: PORTABLE CHEST - 1 VIEW  COMPARISON:  08/08/2014  FINDINGS: ET tube tip is above the carina. Normal heart size. Small bilateral pleural effusions persist. No airspace consolidation.  IMPRESSION: Persistent bilateral pleural effusions.   Electronically Signed   By: Signa Kell M.D.   On: 08/08/2014 08:07   Dg Chest Port 1 View  08/08/2014   CLINICAL DATA:  Respiratory failure. Endotracheal tube placement. Initial encounter.  EXAM: PORTABLE CHEST - 1 VIEW  COMPARISON:  Chest radiograph performed 08/07/2014  FINDINGS: The patient's endotracheal tube appears to end at or just below the carina. This could be retracted 2-3 cm.  Dense right basilar airspace opacification raises concern for pneumonia. More mild left basilar airspace opacity is seen. A small right pleural effusion  is suspected. No pneumothorax is seen.  The cardiomediastinal silhouette is borderline normal in size. No acute osseous abnormalities are identified. Cervical spinal fusion hardware is partially imaged.  IMPRESSION: 1. Endotracheal tube seen ending at or just below the carina. This could be retracted 2-3 cm. 2. Dense right basilar airspace opacification raises concern for worsening pneumonia. More mild left basilar airspace opacity seen. Suspect small right pleural effusion.  These results were called by telephone at the time of interpretation on 08/08/2014  at 2:50 am to Nursing on [facility], who verbally acknowledged these results.   Electronically Signed   By: Roanna Raider M.D.   On: 08/08/2014 02:50   Dg Chest Portable 1 View  08/07/2014   CLINICAL DATA:  Shortness of breath  EXAM: PORTABLE CHEST - 1 VIEW  COMPARISON:  07/22/2014  FINDINGS: Mild residual scarring/ opacity in the right middle/lower lobe, improved. No pleural effusion or pneumothorax.  The heart is normal in size.  IMPRESSION: Mild residual scarring/ opacity in the right middle/lower lobe, improved.   Electronically Signed   By: Charline Bills M.D.   On: 08/07/2014 20:34   Dg Chest Port 1 View  07/22/2014   CLINICAL DATA:  Right lower lobe infiltrate. Increasing shortness of breath and chest congestion.  EXAM: PORTABLE CHEST - 1 VIEW  COMPARISON:  07/17/2014  FINDINGS: Slight improvement in the pneumonia in the right lower lobe. New accentuation of the interstitial markings at the left lung base. Probable small bilateral pleural effusions superimposed on emphysema. Tortuosity of the thoracic aorta. Overall heart size is normal.  IMPRESSION: Slight improvement in right lower lobe pneumonia. Interstitial accentuation at the left lung base could represent pneumonitis. Is the patient aspirating? COPD. Probable small effusions.   Electronically Signed   By: Francene Boyers M.D.   On: 07/22/2014 07:40   Dg Knee Left Port  07/19/2014   CLINICAL  DATA:  Status post fall.  EXAM: PORTABLE LEFT KNEE - 1-2 VIEW  COMPARISON:  None.  FINDINGS: There is no evidence of fracture, dislocation, or joint effusion. There is generalized osteopenia. There is no evidence of arthropathy or other focal bone abnormality. Soft tissues are unremarkable. There is peripheral vascular atherosclerotic disease.  IMPRESSION: No acute osseous injury of the left knee.   Electronically Signed   By: Elige Ko   On: 07/19/2014 20:25   Dg Swallowing Func-speech Pathology  07/27/2014    Objective Swallowing Evaluation:      RECOMMENDATION 07/27/2014  SLP Diet Recommendations Dysphagia 3 (Mech soft);Thin  Liquid Administration via Straw,cup  Medication Administration Crushed with puree  Compensations Slow rate;Small sips/bites;Follow solids with  liquid;Multiple dry swallows after each bite/sip, cough and expectoration  t/o meal as needed  Postural Changes and/or Swallow Maneuvers n/a     [facility] IP OTHER RECOMMENDATIONS 07/27/2014  Recommended Consults (None)  Oral Care Recommendations Oral care QID  Other Recommendations (None)     [facility] IP FOLLOW UP RECOMMENDATIONS 07/26/2014  Follow up Recommendations Skilled Nursing facility     [facility] IP FREQUENCY AND DURATION 07/27/2014  Speech Therapy Frequency (ACUTE ONLY) min 2x/week  Treatment Duration 1 week         [facility] IP REASON FOR REFERRAL 07/27/2014  Reason for Referral Objectively evaluate swallowing function     [facility] IP ORAL PHASE 07/27/2014                    Oral Phase WFL                                                                  [facility] IP PHARYNGEAL PHASE 07/27/2014  Pharyngeal Phase Impaired  Pharyngeal Comment multiple swallows (x3) across consistencies with  extended breath holding noted, mild laryngeal penetration noted during  swallow, trace aspiration noted after swallow due to spillage of barium  from pyriform sinus  into  open larynx, following solids with liquids  helpful, cough and expectoration helpful to decrease residuals, head turn  right/left NOT helpful, limited postural changes available to use due to  cervical decompression sx in 06      [facility] IP CERVICAL ESOPHAGEAL PHASE 07/27/2014  Cervical Esophageal Phase Impaired                                   Thin Straw Esophageal backflow into the pharynx;Other (Comment)     Cervical Esophageal Comment pt continues with poor clearance of cervical  esophagus with backflow of thin and puree without pt sensation, barium  mixed with secretions, per prior MBS in 2014 pt with narrowing of cervical  esophagus - suspect hardware impinging into cervical esophagus and  contributing to decreased clearance             Donavan Burnet, MS [facility] SLP [phone]    Dg C-arm 1-60 Min  07/20/2014   CLINICAL DATA:  69 year old male with left intertrochanteric femoral fracture undergoing ORIF  EXAM: DG C-ARM 61-120 MIN; LEFT FEMUR 2 VIEWS  COMPARISON:  Preoperative radiographs 07/19/2014  FINDINGS: For intraoperative spot radiographs demonstrate open reduction internal fixation of intertrochanteric femoral fracture with an intra medullary femoral nail and transfemoral neck gamma nail. No evidence of immediate hardware complication. There is a single distal interlocking screw.  IMPRESSION: ORIF of left intertrochanteric femoral fracture without evidence of immediate complication   Electronically Signed   By: Malachy Moan M.D.   On: 07/20/2014 20:39   Dg Hip Unilat With Pelvis 2-3 Views Left  07/19/2014   CLINICAL DATA:  Fall today with left hip pain, initial encounter  EXAM: LEFT HIP (WITH PELVIS) 2-3 VIEWS  COMPARISON:  None.  FINDINGS: There is an a minimally displaced intratrochanteric fracture of the proximal left femur. No dislocation is noted. Contrast material is seen from prior speech pathology examination. The pelvic ring is otherwise intact. No other focal abnormality is seen.  IMPRESSION:  Mildly displaced left intratrochanteric femoral fracture.   Electronically Signed   By: Alcide Clever M.D.   On: 07/19/2014 17:34   Dg Femur Min 2 Views Left  07/20/2014   CLINICAL DATA:  Postop left hip fracture repair  EXAM: LEFT FEMUR 2 VIEWS  COMPARISON:  07/19/2014  FINDINGS: Internal fixation across the left intertrochanteric femoral fracture. Near anatomic alignment. No hardware or bony complicating feature.  IMPRESSION: Internal fixation across the left femoral intertrochanteric fracture. No complicating feature.   Electronically Signed   By: Charlett Nose M.D.   On: 07/20/2014 21:20   Dg Femur Min 2 Views Left  07/20/2014   CLINICAL DATA:  69 year old male with left intertrochanteric femoral fracture undergoing ORIF  EXAM: DG C-ARM 61-120 MIN; LEFT FEMUR 2 VIEWS  COMPARISON:  Preoperative radiographs 07/19/2014  FINDINGS: For intraoperative spot radiographs demonstrate open reduction internal fixation of intertrochanteric femoral fracture with an intra medullary femoral nail and transfemoral neck gamma nail. No evidence of immediate hardware complication. There is a single distal interlocking screw.  IMPRESSION: ORIF of left intertrochanteric femoral fracture without evidence of immediate complication   Electronically Signed   By: Malachy Moan M.D.   On: 07/20/2014 20:39  CBC  Recent Labs Lab 08/12/14 0318 08/13/14 0345 08/14/14 0238  WBC 6.6 6.3 10.2  HGB 9.9* 9.9* 11.2*  HCT 29.6* 29.0* 33.8*  PLT 332 305 267  MCV 92.5 91.8 93.9  MCH 30.9 31.3 31.1  MCHC 33.4 34.1 33.1  RDW 14.9 14.3 14.3    Chemistries   Recent Labs Lab 08/12/14 0318 08/13/14 0345 08/14/14 0238 08/16/14 0228 08/16/14 2120 08/17/14 0334  NA 136 136 135 138  --  137  K 4.1 3.8 4.3 3.6  --  3.9  CL 98* 97* 98* 93*  --  95*  CO2 31 30 26  36*  --  33*  GLUCOSE 140* 148* 87 110*  --  124*  BUN 28* 25* 25* 16  --  22*  CREATININE 0.72 0.61 0.58* 0.72  --  0.65  CALCIUM 8.7* 8.8* 8.6* 8.7*  --  8.4*   MG 1.9  --   --  1.5* 2.6* 2.4  AST  --  28  --   --   --   --   ALT  --  25  --   --   --   --   ALKPHOS  --  58  --   --   --   --   BILITOT  --  0.5  --   --   --   --     CBG:  Recent Labs Lab 08/17/14 0752 08/17/14 1229 08/17/14 1700 08/18/14 0032 08/18/14 0751  GLUCAP 115* 104* 88 117* 130*       Studies: No results found.    No results found for: HGBA1C Lab Results  Component Value Date   CREATININE 0.65 08/17/2014       Scheduled Meds: . acetylcysteine  4 mL Nebulization BID  . antiseptic oral rinse  7 mL Mouth Rinse QID  . budesonide  0.25 mg Nebulization Q6H  . chlorhexidine  15 mL Mouth Rinse BID  . famotidine  20 mg Per Tube BID  . feeding supplement (PRO-STAT SUGAR FREE 64)  30 mL Per Tube Daily  . free water  100 mL Per Tube 3 times per day  . furosemide  40 mg Intravenous Daily  . heparin subcutaneous  5,000 Units Subcutaneous 3 times per day  . ipratropium-albuterol  3 mL Nebulization Q6H  . multivitamin  5 mL Per Tube Daily  . nebivolol  10 mg Per J Tube Daily  . polyethylene glycol  17 g Oral Daily  . QUEtiapine  50 mg Per Tube QHS  . thiamine  100 mg Per J Tube Daily   Continuous Infusions: . sodium chloride 10 mL/hr at 08/17/14 0800  . feeding supplement (OSMOLITE 1.2 CAL) 1,000 mL (08/17/14 1814)    Principal Problem:   Acute respiratory failure with hypercapnia Active Problems:   Aspiration pneumonia   Hyponatremia   Hyperglycemia   Respiratory failure   Pressure ulcer   Endotracheally intubated   Pneumonia   Endotracheal tube present    Time spent: 45 minutes   [facility]  [facility] Pager [phone]. If 7PM-7AM, please contact night-coverage at [email or web address], password [facility] 08/18/2014, 12:08 PM  LOS: 11 days

## 2014-08-19 LAB — GLUCOSE, CAPILLARY
GLUCOSE-CAPILLARY: 116 mg/dL — AB (ref 65–99)
Glucose-Capillary: 118 mg/dL — ABNORMAL HIGH (ref 65–99)
Glucose-Capillary: 144 mg/dL — ABNORMAL HIGH (ref 65–99)

## 2014-08-19 MED ORDER — FENTANYL CITRATE (PF) 100 MCG/2ML IJ SOLN
25.0000 ug | INTRAMUSCULAR | Status: DC | PRN
  Administered 2014-08-19 – 2014-08-20 (×3): 25 ug via INTRAVENOUS
  Filled 2014-08-19 (×3): qty 2

## 2014-08-19 MED ORDER — MORPHINE SULFATE (CONCENTRATE) 10 MG /0.5 ML PO SOLN: 5.0000 mg | ORAL | Status: AC | PRN

## 2014-08-19 MED ORDER — GLYCOPYRROLATE 0.2 MG/ML IJ SOLN
0.4000 mg | INTRAMUSCULAR | Status: DC
  Administered 2014-08-19 – 2014-08-20 (×5): 0.4 mg via INTRAVENOUS
  Filled 2014-08-19 (×12): qty 2

## 2014-08-19 MED ORDER — LORAZEPAM 2 MG/ML PO CONC: 1.0000 mg | Freq: Four times a day (QID) | ORAL | Status: AC | PRN

## 2014-08-19 NOTE — Discharge Summary (Addendum)
Physician Discharge Summary  ROC STREETT MRN: [id number] DOB/AGE: 68-Jul-1947 68 y.o.  PCP: Georgann Housekeeper, MD   Admit date: 08/07/2014 Discharge date: 08/19/2014  Discharge Diagnoses:     Principal Problem:   Acute respiratory failure with hypercapnia Active Problems:   Aspiration pneumonia   Hyponatremia   Hyperglycemia   Respiratory failure   Pressure ulcer   Endotracheally intubated   Pneumonia   Endotracheal tube present   Palliative care encounter   DNR (do not resuscitate)   Dyspnea    Follow-up recommendations Patient is being DC to SNF vs [facility]  called son [place] Gallicchio at [phone] explained to patient that if he appeals dc that he will be responsible for the hospital stay for today, as medically we are not doing anything for him, he is medically as stable as he can be given all his co morbidities and his current status of being hospice eligible  Patient may stay until tomorrow if family unable to decide      Medication List    STOP taking these medications        enoxaparin 30 MG/0.3ML injection  Commonly known as:  LOVENOX     feeding supplement (OSMOLITE 1.2 CAL) Liqd     folic acid 1 MG tablet  Commonly known as:  FOLVITE     free water Soln     HYDROcodone-acetaminophen 5-325 MG per tablet  Commonly known as:  NORCO/VICODIN     levalbuterol 1.25 MG/0.5ML nebulizer solution  Commonly known as:  XOPENEX     multivitamin with minerals Tabs tablet     nebivolol 5 MG tablet  Commonly known as:  BYSTOLIC     thiamine 100 MG tablet     tiotropium 18 MCG inhalation capsule  Commonly known as:  SPIRIVA HANDIHALER      TAKE these medications        budesonide-formoterol 160-4.5 MCG/ACT inhaler  Commonly known as:  SYMBICORT  Inhale 2 puffs into the lungs 2 (two) times daily.     guaiFENesin 600 MG 12 hr tablet  Commonly known as:  MUCINEX  Take 600 mg by mouth 2 (two) times daily.     LORazepam 2 MG/ML concentrated solution   Commonly known as:  ATIVAN  Take 0.5 mLs (1 mg total) by mouth every 6 (six) hours as needed for anxiety.     morphine CONCENTRATE 10 mg / 0.5 ml concentrated solution  Place 0.25 mLs (5 mg total) into feeding tube every 4 (four) hours as needed for severe pain or shortness of breath.         Discharge Condition: poor   Disposition: 03-Skilled Nursing Facility   Consults:  PCCM   Significant Diagnostic Studies:  Ct Abdomen Wo Contrast  07/21/2014   CLINICAL DATA:  69 year old male with generalized weakness. Evaluate anatomy prior to gastrostomy tube placement. History of dysphagia.  EXAM: CT ABDOMEN WITHOUT CONTRAST  TECHNIQUE: Multidetector CT imaging of the abdomen was performed following the standard protocol without IV contrast.  COMPARISON:  No priors.  FINDINGS: Lower chest: Areas of airspace consolidation in the right middle lobe and right lower lobe. Small right pleural effusion layering dependently. Areas of bronchiectasis and potential cavitation in the posterior aspect of the right lower lobe, poorly evaluated on today's noncontrast CT examination. 1.6 x 1.0 cm nodular density in the medial aspect of the left lower lobe (image 13 of series 5). Trace left pleural effusion. Atherosclerotic calcifications in the left anterior descending  and right coronary arteries.  Hepatobiliary: No definite cystic or solid hepatic lesions are identified on today's noncontrast CT examination. Gallbladder is not confidently identified and could be collapsed or surgically absent (no surgical clips are noted in the region of the gallbladder fossa).  Pancreas: Pancreas is poorly depicted, but grossly unremarkable.  Spleen: Unremarkable.  Adrenals/Urinary Tract: The unenhanced appearance of the kidneys and adrenal glands is normal bilaterally. No hydroureteronephrosis.  Stomach/Bowel: The unenhanced appearance of the visualized portions of the stomach is unremarkable. Importantly, however, there are loops  of small bowel and colon superficial to the visualized portions of the stomach. No pathologic dilatation of the visualized portions of the small bowel or colon.  Vascular/Lymphatic: Atherosclerosis throughout the visualized abdominal vasculature, without definite aneurysm. No definite lymphadenopathy in the visualized portions of the abdomen on today's noncontrast CT examination.  Other: No significant volume of ascites or definite pneumoperitoneum identified in the abdomen.  Musculoskeletal: Chronic appearing compression fracture of L1 with approximately 20% loss of anterior vertebral body height. There are no aggressive appearing lytic or blastic lesions noted in the visualized portions of the skeleton.  IMPRESSION: 1. Stomach is incompletely visualized on today's examination. The visualized portions of the stomach are remarkable for superficial loops of small bowel and colon interposed between the stomach in the anterior abdominal wall. 2. Airspace consolidation in the right middle and lower lobes concerning for acute infection. This appears be associated with some areas of bronchiectasis and potential cavitation in the posterior aspect of the right lower lobe. There is also a small right parapneumonic pleural effusion. 3. Pleural-based 1.6 x 1.0 cm nodular density in the medial aspect of the left lower lobe. This could also be infectious or inflammatory in etiology, however, the possibility of neoplasm is not excluded, and close attention on future followup examinations is recommended. There is also trace left pleural effusion. At this time, noncontrast chest CT is recommended in 2-3 weeks to assess for resolution of the right lower/middle lobe pneumonia following trial of antimicrobial therapy, and to reassess this left lower lobe nodular region. 4. Extensive atherosclerosis, including at least 2 vessel coronary artery disease. Please note that although the presence of coronary artery calcium documents the  presence of coronary artery disease, the severity of this disease and any potential stenosis cannot be assessed on this non-gated CT examination. Assessment for potential risk factor modification, dietary therapy or pharmacologic therapy may be warranted, if clinically indicated. 5. Additional incidental findings, as above.   Electronically Signed   By: Trudie Reed M.D.   On: 07/21/2014 20:26   Ct Angio Chest Pe W/cm &/or Wo Cm  08/08/2014   CLINICAL DATA:  Acute onset of respiratory distress. Initial encounter.  EXAM: CT ANGIOGRAPHY CHEST WITH CONTRAST  TECHNIQUE: Multidetector CT imaging of the chest was performed using the standard protocol during bolus administration of intravenous contrast. Multiplanar CT image reconstructions and MIPs were obtained to evaluate the vascular anatomy.  CONTRAST:  80mL OMNIPAQUE IOHEXOL 350 MG/ML SOLN  COMPARISON:  Chest radiograph performed earlier today at 2:27 a.m.  FINDINGS: There is no evidence of pulmonary embolus.  There is dense opacification of both lower lung lobes, concerning for bilateral lower lobe pneumonia. Minimal patchy opacities are seen within the right upper lobe. Bilateral emphysematous change is seen at the upper lung lobes. No definite pleural effusion or pneumothorax is seen. There is layering fluid within the right mainstem bronchus, concerning for aspiration. No masses are identified; no abnormal focal contrast enhancement  is seen.  The mediastinum is unremarkable in appearance. No mediastinal lymphadenopathy is seen. No pericardial effusion is identified. The great vessels are grossly unremarkable in appearance. The patient's endotracheal tube is seen ending 3 cm above the carina. No axillary lymphadenopathy is seen. The thyroid gland is unremarkable in appearance.  The visualized portions of the liver and spleen are unremarkable. The pancreas, adrenal glands and kidneys are not well assessed due to surrounding edema. Scattered calcification is  noted along the proximal abdominal aorta.  No acute osseous abnormalities are seen. Cervical spinal fusion hardware is noted. There is mild chronic compression deformity involving vertebral body L1.  Review of the MIP images confirms the above findings.  IMPRESSION: 1. No evidence of pulmonary embolus. 2. Dense bilateral lower lobe airspace opacification, compatible with bilateral lower lobe pneumonia. Mild patchy opacities seen within the right upper lobe. Layering fluid noted within the right mainstem bronchus, concerning for aspiration. 3. Bilateral emphysematous change at the upper lung lobes.   Electronically Signed   By: Roanna Raider M.D.   On: 08/08/2014 04:08   Pelvis Portable  07/20/2014   CLINICAL DATA:  69 year old status post left hip repair  EXAM: PORTABLE PELVIS 1-2 VIEWS  COMPARISON:  Preoperative radiographs 07/19/2014  FINDINGS: Interval ORIF of intertrochanteric left femoral fracture with intra medullary nail and a transfemoral neck gamma nail. Improved alignment of the fracture fragments. No evidence of acute hardware complication. Expected postoperative subcutaneous emphysema. Oral contrast material opacifies the colon. Scattered atherosclerotic vascular calcifications are noted.  IMPRESSION: ORIF left intertrochanteric femoral fracture without evidence of complication.   Electronically Signed   By: Malachy Moan M.D.   On: 07/20/2014 21:15   Ir Fluoro Rm 30-60 Min  07/23/2014   CLINICAL DATA:  Dysphagia, aspiration pneumonia. Request for enteral feeding support. Recent CT had demonstrated no safe percutaneous approach to the nondistended stomach due to overlying small bowel and colon.  EXAM: IR FLOURO RM 0-60 MIN  COMPARISON:  CT 07/21/2014  TECHNIQUE: Patient was positioned prone on the supine on the procedure table.  Intravenous Fentanyl and Versed were administered as conscious sedation during continuous cardiorespiratory monitoring by the radiology RN, with a total moderate  sedation time of less than 30 minutes.  A 5 French angled angiographic catheter placed as orogastric tube to allow gastric insufflation with air. Under fluoroscopy, using multiple obliquities, it was apparent that the redundant transverse colon and loops of small bowel continued overlie the gastric body and antrum, such that no safe percutaneous approach was available for gastrostomy tube placement.  IMPRESSION: 1. No safe percutaneous approach for gastrostomy tube placement due to overlying bowel. Consider surgical placement if needed.   Electronically Signed   By: Corlis Leak M.D.   On: 07/23/2014 08:10   Dg Chest Port 1 View  08/16/2014   CLINICAL DATA:  Follow-up pneumonia.  EXAM: PORTABLE CHEST - 1 VIEW  COMPARISON:  08/15/2014  FINDINGS: Bibasilar airspace opacities are again noted, slightly improved since prior study. Somewhat limited study with patient's right arm and hand projecting over the right hemithorax. Small right pleural effusion is stable. Heart is upper limits normal in size.  IMPRESSION: Bibasilar opacities slightly improved since prior study. Small right effusion.   Electronically Signed   By: Charlett Nose M.D.   On: 08/16/2014 07:55   Dg Chest Port 1 View  08/15/2014   CLINICAL DATA:  Pneumonia.  EXAM: PORTABLE CHEST - 1 VIEW  COMPARISON:  11/14/2014.  FINDINGS: Lower cervical ACDF.  Interval extubation.  Monitoring leads project over the chest.  No pneumothorax is present. Emphysema. Curvilinear density is present at the RIGHT lung base, probably representing RIGHT middle lobe atelectasis. There is a RIGHT pleural effusion, with probable subsegmental atelectasis in the RIGHT lower lobe.  Compared to yesterday's exam, there is increasing airspace opacity at the LEFT base, extending from retrocardiac region to the LEFT costophrenic angle.  IMPRESSION: 1. Interval extubation. 2. Shifting airspace disease with increasing airspace opacity at the LEFT lung base, probably representing combination  of airspace disease, atelectasis and small effusion. 3. Opacity at the RIGHT lung base likely represents RIGHT middle lobe atelectasis and shifting pleural effusion. Persistent RIGHT lower lobe consolidation.   Electronically Signed   By: Andreas Newport M.D.   On: 08/15/2014 10:46   Dg Chest Port 1 View  08/14/2014   CLINICAL DATA:  Severe COPD. Respiratory failure. Worsening shortness of breath  EXAM: PORTABLE CHEST - 1 VIEW  COMPARISON:  08/13/2014  FINDINGS: Endotracheal tube remains in place, unchanged. Continued persistent density at the right lung base. No confluent opacity on the left. Heart is normal size. Tortuosity of the thoracic aorta. No definite significant effusion. No acute bony abnormality.  IMPRESSION: Continued right basilar airspace opacity.  No significant change.   Electronically Signed   By: Charlett Nose M.D.   On: 08/14/2014 07:11   Dg Chest Port 1 View  08/13/2014   CLINICAL DATA:  Respiratory failure  EXAM: PORTABLE CHEST - 1 VIEW  COMPARISON:  Portable chest x-ray of August 12, 2014  FINDINGS: The left lung is well-expanded. There is minimal persistent subsegmental atelectasis inferioromedially on the left. On the right there is persistent increased density at the lung base. The cardiac silhouette is not enlarged. There is tortuosity of the ascending and descending thoracic aorta. The endotracheal tube tip lies 3.2 cm above the crotch of the carina.  IMPRESSION: Essentially stable appearance of the chest since yesterday's study. The endotracheal tube is in reasonable position.   Electronically Signed   By: David  [place] M.D.   On: 08/13/2014 07:42   Dg Chest Port 1 View  08/12/2014   CLINICAL DATA:  Followup exam.  Intubated patient.  EXAM: PORTABLE CHEST - 1 VIEW  COMPARISON:  08/12/2014 at 4:54 a.m.  FINDINGS: Endotracheal tube tip projects 3.5 cm above the carina.  Hazy opacity at the right lung base consistent with combination of atelectasis and pleural fluid is similar to the  prior exam. Opacity at the left lung base has improved from prior studies, but is similar to the most recent exam, likely also atelectasis. No pulmonary edema. No pneumothorax.  Cardiac silhouette is normal in size.  Aorta is uncoiled.  IMPRESSION: 1. No significant change from the earlier chest radiograph allowing for differences in patient positioning and technique. 2. Endotracheal tube is well positioned. 3. Right greater than left lung base opacity is stable   Electronically Signed   By: Amie Portland M.D.   On: 08/12/2014 17:57   Dg Chest Port 1 View  08/12/2014   CLINICAL DATA:  Respiratory failure.  Endotracheal intubation.  EXAM: PORTABLE CHEST - 1 VIEW  COMPARISON:  08/11/2014  FINDINGS: The endotracheal tube is 4.6 cm above the carina. There is persistent basilar opacity, right greater than left, unchanged. There is tortuosity and ectasia of the aorta, unchanged. There is no large effusion. There is no pneumothorax  IMPRESSION: No significant interval change in the bilateral airspace opacities.  Support equipment appears satisfactorily  positioned.   Electronically Signed   By: Ellery Plunk M.D.   On: 08/12/2014 06:30   Dg Chest Port 1 View  08/11/2014   CLINICAL DATA:  Hypoxia  EXAM: PORTABLE CHEST - 1 VIEW  COMPARISON:  August 10, 2014  FINDINGS: Endotracheal tube tip is 3.9 cm above the carina. No pneumothorax. There is a right pleural effusion. There is patchy atelectasis in the lung bases. Lungs elsewhere clear. The heart is upper normal in size with pulmonary vascularity within normal limits. Aorta is tortuous. No adenopathy.  IMPRESSION: Endotracheal tube as described. No pneumothorax. Persistent right pleural effusion with patchy bibasilar atelectasis. No new parenchymal lung opacity. No change in cardiac silhouette.   Electronically Signed   By: Bretta Bang III M.D.   On: 08/11/2014 08:03   Dg Chest Port 1 View  08/10/2014   CLINICAL DATA:  Pneumonia  EXAM: PORTABLE CHEST - 1 VIEW   COMPARISON:  08/09/2014  FINDINGS: The endotracheal tube is 3.7 cm above the carina. Basilar airspace opacities persist bilaterally. There probably are small effusions. There is no pneumothorax.  IMPRESSION: Satisfactory ET tube position. Unchanged basilar airspace opacities and probable small effusions.   Electronically Signed   By: Ellery Plunk M.D.   On: 08/10/2014 05:13   Dg Chest Port 1 View  08/09/2014   CLINICAL DATA:  Pneumonia, history COPD, hypertension, smoker  EXAM: PORTABLE CHEST - 1 VIEW  COMPARISON:  Portable exam 0458 hours compared to 08/08/2014  FINDINGS: Rotated to the LEFT.  Tip of endotracheal tube projects 9.9 cm above carina.  Normal heart size, mediastinal contours and pulmonary vascularity.  BILATERAL lower lobe infiltrates.  Underlying emphysematous changes.  No gross pleural effusion or pneumothorax.  Diffuse osseous demineralization.  IMPRESSION: COPD changes with BILATERAL lower lobe infiltrates consistent with pneumonia.  High position of endotracheal tube; may advance tube 5 cm for more stable positioning.   Electronically Signed   By: Ulyses Southward M.D.   On: 08/09/2014 07:17   Dg Chest Port 1 View  08/08/2014   CLINICAL DATA:  ET tube placement  EXAM: PORTABLE CHEST - 1 VIEW  COMPARISON:  08/08/2014  FINDINGS: ET tube tip is above the carina. Normal heart size. Small bilateral pleural effusions persist. No airspace consolidation.  IMPRESSION: Persistent bilateral pleural effusions.   Electronically Signed   By: Signa Kell M.D.   On: 08/08/2014 08:07   Dg Chest Port 1 View  08/08/2014   CLINICAL DATA:  Respiratory failure. Endotracheal tube placement. Initial encounter.  EXAM: PORTABLE CHEST - 1 VIEW  COMPARISON:  Chest radiograph performed 08/07/2014  FINDINGS: The patient's endotracheal tube appears to end at or just below the carina. This could be retracted 2-3 cm.  Dense right basilar airspace opacification raises concern for pneumonia. More mild left basilar  airspace opacity is seen. A small right pleural effusion is suspected. No pneumothorax is seen.  The cardiomediastinal silhouette is borderline normal in size. No acute osseous abnormalities are identified. Cervical spinal fusion hardware is partially imaged.  IMPRESSION: 1. Endotracheal tube seen ending at or just below the carina. This could be retracted 2-3 cm. 2. Dense right basilar airspace opacification raises concern for worsening pneumonia. More mild left basilar airspace opacity seen. Suspect small right pleural effusion.  These results were called by telephone at the time of interpretation on 08/08/2014 at 2:50 am to Nursing on [facility], who verbally acknowledged these results.   Electronically Signed   By: Beryle Beams.D.  On: 08/08/2014 02:50   Dg Chest Portable 1 View  08/07/2014   CLINICAL DATA:  Shortness of breath  EXAM: PORTABLE CHEST - 1 VIEW  COMPARISON:  07/22/2014  FINDINGS: Mild residual scarring/ opacity in the right middle/lower lobe, improved. No pleural effusion or pneumothorax.  The heart is normal in size.  IMPRESSION: Mild residual scarring/ opacity in the right middle/lower lobe, improved.   Electronically Signed   By: Charline Bills M.D.   On: 08/07/2014 20:34   Dg Chest Port 1 View  07/22/2014   CLINICAL DATA:  Right lower lobe infiltrate. Increasing shortness of breath and chest congestion.  EXAM: PORTABLE CHEST - 1 VIEW  COMPARISON:  07/17/2014  FINDINGS: Slight improvement in the pneumonia in the right lower lobe. New accentuation of the interstitial markings at the left lung base. Probable small bilateral pleural effusions superimposed on emphysema. Tortuosity of the thoracic aorta. Overall heart size is normal.  IMPRESSION: Slight improvement in right lower lobe pneumonia. Interstitial accentuation at the left lung base could represent pneumonitis. Is the patient aspirating? COPD. Probable small effusions.   Electronically Signed   By: Francene Boyers M.D.   On:  07/22/2014 07:40   Dg Swallowing Func-speech Pathology  07/27/2014    Objective Swallowing Evaluation:    Patient Details  Name: Phillip May MRN: [id number] Date of Birth: 08-18-1945  Today's Date: 07/27/2014 Time: SLP Start Time (ACUTE ONLY): 0950-SLP Stop Time (ACUTE ONLY): 1033 SLP Time Calculation (min) (ACUTE ONLY): 43 min  Past Medical History:  Past Medical History  Diagnosis Date  . Inguinal hernia   . COPD (chronic obstructive pulmonary disease)   . GERD (gastroesophageal reflux disease)   . Chronic hoarseness   . Tobacco abuse   . Cervical spinal stenosis     C4-C5; C5-C6; severe stenosis and abnormal  cord signal C6-C7 s/p  decompression in 04/2004   Past Surgical History:  Past Surgical History  Procedure Laterality Date  . Inguinal hernia repair    . Posterior laminectomy / decompression cervical spine  2006  . Intramedullary (im) nail intertrochanteric Left 07/20/2014    Procedure: INTRAMEDULLARY (IM) NAIL INTERTROCHANTRIC LEFT HIP;  Surgeon:  [name], MD;  Location: [facility];  Service: Orthopedics;  Laterality:  Left;   HPI:  Other Pertinent Information: Mika Anastasi is a 69 y.o. male, history of  COPD (not on oxygen), ongoing smoking, occasional alcohol use, history of  dysphagia on a soft diet per MBS in September 2014, chronic hoarse voice,  GERD, generalized weakness and deconditioning.  Pt had fall with hip fx  requiring surgical repair.  PSH + for cervical decompression surgery in  2006, orthopedic sx during this admit.  Pt reported chronic problems with  hoarseness and dysphagia s/p cervical surgery 2006.  He also experienced  30 pound weight loss within 3 years-pt reported in 2014.  He lives at home  alone and receives help from neighbors for getting groceries and  activities of daily living.  The MBS in September 2014 did show trace  aspiration of thins and penetration of nectar liquids.  CXR showed right  lower lobe pna.    Pt had planned on getting PEG tube but today this was   discontinued. hospital. Unfortunately Dysphagia had not improved, after  discussion with patient, PEG tube placement was planned, because of the  anatomy this was not able to be done by interventional radiology, general  surgery planned PEG tube placement on 6/13-however patient/family were  hesistant to  proceed-and is now tentatively scheduled for 07/28/14 per MD  note,  Pt continues to express desire to consume po intake and receive PEG  for nutrition.   No Data Recorded  Assessment / Plan / Recommendation CHL IP CLINICAL IMPRESSIONS 07/27/2014  Therapy Diagnosis Severe pharyngeal phase dysphagia;Mild cervical  esophageal phase dysphagia;Moderate pharyngeal phase dysphagia;Moderate  cervical esophageal phase dysphagia  Clinical Impression Cough noted with expectoration of copious secretions  after dry swallow indicative of swallow ability.    Pt continues with moderately severe sensorimotor pharyngo-cervical  esophageal dysphagia without significant improvement since testing earlier  in this admission.  Weakness in pharyngeal contraction and tongue base  retraction/poor epiglottic deflection continues resulting in gross  pharyngeal residuals without adequate pt sensation.  With liquids pt  conducts multiple reflexive swallows (x4 - with liquids) with extended  breathhold *presumed to be compensatory to help clear and prevent gross  aspiration.  Poor hyolaryngeal elevation allows laryngeal penetration of  liquids during the swallow.  Mild amount of aspiration after the swallow  noted as barium spills posteior into open larynx from pyriform sinus.   Pt  did not consistently sense residuals today (mixed with secretions) but  cued swallow, liquid follow solids and cued expectoration (of secretions  mixed with barium) effective to clear 80%.     Using live video, educated pt to findings, effective compensations.  Also  recommend continue with suction to help pt with his secretion management.      Pt again expresses  desire for po with risks known and pursuing feeding  tube for nutritional support only.  Pt understands feeding tube will not  prevent aspiration but dysphagia prevents him from obtaining adequate  nutrition via po. Recommend consider dys3/thin with strict precautions and  KNOWN aspiration risks per pt wishes.  Pt will require crushed or liquid  medications due to level of dysphagia.        No flowsheet data found.   CHL IP DIET RECOMMENDATION 07/27/2014  SLP Diet Recommendations Dysphagia 3 (Mech soft);Thin  Liquid Administration via Straw,cup  Medication Administration Crushed with puree  Compensations Slow rate;Small sips/bites;Follow solids with  liquid;Multiple dry swallows after each bite/sip, cough and expectoration  t/o meal as needed  Postural Changes and/or Swallow Maneuvers n/a     CHL IP OTHER RECOMMENDATIONS 07/27/2014  Recommended Consults (None)  Oral Care Recommendations Oral care QID  Other Recommendations (None)     CHL IP FOLLOW UP RECOMMENDATIONS 07/26/2014  Follow up Recommendations Skilled Nursing facility     [facility] IP FREQUENCY AND DURATION 07/27/2014  Speech Therapy Frequency (ACUTE ONLY) min 2x/week  Treatment Duration 1 week         CHL IP REASON FOR REFERRAL 07/27/2014  Reason for Referral Objectively evaluate swallowing function     CHL IP ORAL PHASE 07/27/2014                    Oral Phase WFL                                                                  CHL IP PHARYNGEAL PHASE 07/27/2014  Pharyngeal Phase Impaired  Pharyngeal Comment multiple swallows (x3) across consistencies with  extended breath holding noted, mild laryngeal penetration noted during  swallow, trace aspiration noted after swallow due to spillage of barium  from pyriform sinus  into open larynx, following solids with liquids  helpful, cough and expectoration helpful to decrease residuals, head turn  right/left  NOT helpful, limited postural changes available to use due to  cervical decompression sx in 06      CHL IP CERVICAL ESOPHAGEAL PHASE 07/27/2014  Cervical Esophageal Phase Impaired                                   Thin Straw Esophageal backflow into the pharynx;Other (Comment)     Cervical Esophageal Comment pt continues with poor clearance of cervical  esophagus with backflow of thin and puree without pt sensation, barium  mixed with secretions, per prior MBS in 2014 pt with narrowing of cervical  esophagus - suspect hardware impinging into cervical esophagus and  contributing to decreased clearance             Donavan Burnet, MS [facility] SLP [phone]    Dg C-arm 1-60 Min  07/20/2014   CLINICAL DATA:  69 year old male with left intertrochanteric femoral fracture undergoing ORIF  EXAM: DG C-ARM 61-120 MIN; LEFT FEMUR 2 VIEWS  COMPARISON:  Preoperative radiographs 07/19/2014  FINDINGS: For intraoperative spot radiographs demonstrate open reduction internal fixation of intertrochanteric femoral fracture with an intra medullary femoral nail and transfemoral neck gamma nail. No evidence of immediate hardware complication. There is a single distal interlocking screw.  IMPRESSION: ORIF of left intertrochanteric femoral fracture without evidence of immediate complication   Electronically Signed   By: Malachy Moan M.D.   On: 07/20/2014 20:39   Dg Femur Min 2 Views Left  07/20/2014   CLINICAL DATA:  Postop left hip fracture repair  EXAM: LEFT FEMUR 2 VIEWS  COMPARISON:  07/19/2014  FINDINGS: Internal fixation across the left intertrochanteric femoral fracture. Near anatomic alignment. No hardware or bony complicating feature.  IMPRESSION: Internal fixation across the left femoral intertrochanteric fracture. No complicating feature.   Electronically Signed   By: Charlett Nose M.D.   On: 07/20/2014 21:20   Dg Femur Min 2 Views Left  07/20/2014   CLINICAL DATA:  69 year old male with left intertrochanteric femoral fracture  undergoing ORIF  EXAM: DG C-ARM 61-120 MIN; LEFT FEMUR 2 VIEWS  COMPARISON:  Preoperative radiographs 07/19/2014  FINDINGS: For intraoperative spot radiographs demonstrate open reduction internal fixation of intertrochanteric femoral fracture with an intra medullary femoral nail and transfemoral neck gamma nail. No evidence of immediate hardware complication. There is a single distal interlocking screw.  IMPRESSION: ORIF of left intertrochanteric femoral fracture without evidence of immediate complication   Electronically Signed   By: Malachy Moan M.D.   On: 07/20/2014 20:39       Filed Weights   08/11/14 0442 08/12/14 0351 08/17/14 1559  Weight: 49.3 kg (108 lb 11 oz) 48.7 kg (107 lb 5.8 oz) 43.4 kg (95 lb 10.9 oz)     Microbiology: Recent Results (from the past 240 hour(s))  Clostridium Difficile by PCR (not at [facility])     Status: None   Collection Time: 08/14/14  6:27 AM  Result Value Ref Range Status   C difficile by pcr NEGATIVE NEGATIVE Final       Blood Culture    Component Value Date/Time   SDES TRACHEAL  ASPIRATE 08/08/2014 0500   SPECREQUEST NONE 08/08/2014 0500   CULT  08/08/2014 0500    Non-Pathogenic Oropharyngeal-type Flora Isolated. Performed at [facility]    REPTSTATUS 08/11/2014 FINAL 08/08/2014 0500      Labs: Results for orders placed or performed during the hospital encounter of 08/07/14 (from the past 48 hour(s))  Glucose, capillary     Status: None   Collection Time: 08/17/14  5:00 PM  Result Value Ref Range   Glucose-Capillary 88 65 - 99 mg/dL  Glucose, capillary     Status: Abnormal   Collection Time: 08/18/14 12:32 AM  Result Value Ref Range   Glucose-Capillary 117 (H) 65 - 99 mg/dL   Comment 1 Notify RN    Comment 2 Document in Chart   Glucose, capillary     Status: Abnormal   Collection Time: 08/18/14  7:51 AM  Result Value Ref Range   Glucose-Capillary 130 (H) 65 - 99 mg/dL  Glucose, capillary     Status: Abnormal    Collection Time: 08/19/14 12:09 AM  Result Value Ref Range   Glucose-Capillary 116 (H) 65 - 99 mg/dL  Glucose, capillary     Status: Abnormal   Collection Time: 08/19/14  8:02 AM  Result Value Ref Range   Glucose-Capillary 118 (H) 65 - 99 mg/dL     Lipid Panel  No results found for: CHOL, TRIG, HDL, CHOLHDL, VLDL, LDLCALC, LDLDIRECT   No results found for: HGBA1C   Lab Results  Component Value Date   CREATININE 0.65 08/17/2014     HPI :WORTHY BOSCHERT is a 69 y.o. male  With a history of COPD, dysphasia, GERD, hypertension. The patient was hospitalized on 07/17/2014 after a fall and a history of community-acquired versus aspiration pneumonia. During his hospital stay, patient was found to have significant dysphagia. Surgery was consulted and a gastrostomy tube was placed. The patient has been obtaining feeds through the gastrostomy tube. Patient was discharged to [facility] nursing facility for rehabilitation as the patient had a intertrochanteric hip fracture that was repaired by orthopedic during the same hospitalization. Today the patient was eating dinner and had a witnessed aspiration event. The patient had sudden onset of respiratory failure due to the aspiration. EMS was called and was placed on CPAP due to oxygenation in the 60s. When the patient arrived to the emergency department the patient was transitioned to BiPAP with improvement of his respiratory status with the patient currently satting approximately 90%. No provoking factors, but breathing improved on C Pap and BiPAP.  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 DNI Continue supportive care, pulmonary toilet, when necessary nebs   Chronic dysphagia and aspiration ,G tube present,Severe protein-calorie malnutrition - on TF via peg , deemed high risk for aspiration, TF DC  Last speech  therapy evaluation was 6/15, they recommended  Dysphagia 3 (Mech soft);Thin for comfort only, currently the patient is nothing by mouth    Chronic anemia without overt blood loss   no CBC checked since 7/2, no further labs per palliative care discussion  Severe R>L LL PNA, NOS,R/o cdiff - negative Blood cultures no growth so far, completed full course of antibiotics treatment for pneumonia Last chest x-ray was done on 7/4 that shows improvement  Hyperglycemia,not be  monitored anymore after DC    H/O EtOH abuse Acute encephalopathy- resolved off sedation  Severe baseline deconditioning overall poor prognosis with current illness and failure to thrive.  His Pall Performance Scale is very poor   GOALS OF CARE ASSESSMENT  Code Status:  DNR  Goals of Care:  Comfort - will stop tube feedings when transfer to hospice facility  3. Symptom Management:  Secretions: Robinul 0.4 mg every 4 hours.  Dysnea/pain: Fentanyl 25 mcg every hour prn.   4. Prognosis: Days to a couple weeks  5. Discharge Planning: Hospice facility vs SNF   Discharge Exam:    Blood pressure 129/90, pulse 93, temperature 98 F (36.7 C), temperature source Axillary, resp. rate 30, height 5\' 7"  (1.702 m), weight 43.4 kg (95 lb 10.9 oz), SpO2 97 %. General: Lying in bed HEENT: Temporal muscle wasting CVS: RRR Resp: Breathing labored, rhonchi throughout Abd: Soft, NT, ND Extrem: Warm, dry Neuro: Arousable, orientation difficult to assess d/t copious secretions and garbled speech        Discharge Instructions    Diet - low sodium heart healthy    Complete by:  As directed      Diet - low sodium heart healthy    Complete by:  As directed      Increase activity slowly    Complete by:  As directed      Increase activity slowly    Complete by:  As directed              Signed: Whitnie Deleon 08/19/2014, 1:43 PM        Time spent >45 mins

## 2014-08-19 NOTE — Clinical Social Work Note (Addendum)
Family has officially decided on residential hospice placement. Referrals have been made. Waiting for followup from the facilities. Family understands that patient will DC to first offer.   Referrals sent to: [facility] - facility liaison states they could likely admit tomorrow [facility]- Facility can admit tomorrow. [facility]- No response at this time [facility]- Facility is reviewing information [facility]- States they could admit tomorrow if the patient is not able to [facility].   Roddie McBryant Jirah Rider MSW, [place]CSWA, [place], [phone]

## 2014-08-19 NOTE — Consult Note (Signed)
[facility] Liaison:  Received request from CSW [place] for family interest in [facility]. Chart reviewed. Received report from PMT NP. Spoke with son [place]had by phone to confirm interest. [place]had is flying to [place] this evening plans to be here tomorrow morning. [place]had [facility]. He confirms plan for tube feeds to stop. Dr. Kern Reaponald Hertweck to assume care per [facility]. Will follow up with CSW in am. Thank you. Forrestine Himva Rondia Higginbotham LCSW [phone]

## 2014-08-19 NOTE — Progress Notes (Signed)
Nutrition Brief Note  Chart reviewed. Pt now transitioning to comfort care.  No further nutrition interventions warranted at this time.  Please re-consult as needed.   Ricka Westra A. Costantino Kohlbeck, RD, LDN, CDE Pager: [phone] After hours Pager: [phone]  

## 2014-08-19 NOTE — Progress Notes (Signed)
PT Discharge Summary Note:  Pt is not appropriate for therapy at this time and is awaiting comfort care measures and is currently aspirating on tube feeds and secretions.  Will sign off on pt at this time.    Thanks,  Harriet ButteEmily Kerstie Agent, PT

## 2014-08-19 NOTE — Clinical Social Work Note (Signed)
CSW has not received any additional offers for patient to be placed in residential hospice for today. Plan will be for [facility] to be [facility] as this is family's preference and liaison is able to accommodate this admission in the morning. CSW will leave report for covering CSW.   Roddie McBryant Jackelin Correia MSW, [place]rest[place]CSWA, [place], [phone]

## 2014-08-19 NOTE — Progress Notes (Addendum)
NCM was told by MD that family member, wants patient to stay another day, but patient has no medical reason to stay.  NCM called son Italyhad Rozzell at [phone] explained to patient that if he appeals dc that he will be responsible for the hospital stay for today.  NCM explained to [place]had that patient could go to SNF and they can make the decision [facility].  Italyhad states he was not sure what he was going to do, whether he will appeal or go to snf.  NCM informed office that family member may be appealing discharge.   IM was given on 08/17/14.  NCM informed family that in order to appeal he would need to call [employer].

## 2014-08-19 NOTE — Progress Notes (Signed)
Daily Progress Note   Patient Name: Phillip May       Date: 08/19/2014 DOB: 1945-07-01  Age: 69 y.o. MRN#: [id number] Attending Physician: Richarda OverlieNayana Abrol, MD Primary Care Physician: Georgann HousekeeperHUSAIN,KARRAR, MD Admit Date: 08/07/2014  Reason for Consultation/Follow-up: Establishing goals of care  Subjective:     Phillip May is lying in bed. Appears more lethargic today but still distressed. He is not answering my questions today. I have had many discussions with his son today. Family is feeling very pressured to make a decision for comfort care/hospice vs SNF. Son, Phillip May, understands that if they choose hospice tube feeding will be d/c and medications will be given for comfort with likelihood of a prognosis of days to a couple weeks. Phillip May tells me that he and his sister have decided that comfort and hospice will be best for him. I made sure that they were comfortable with the goal of comfort and hospice philosophy and not just making a decision based on the fact that they have been told he must leave the hospital today. Phillip May assures me that they believe this is the right thing to do for his father - he only wishes he could have 24 more hours because Phillip May is coming into town this evening (probably not until after 8pm) and wants to have the opportunity to discuss this with his father himself. Phillip May is concerned about him receiving sedating medications for comfort but I reassured him that he will only be given what is needed to make sure he is not in pain and suffering - this amount is different for everyone. Discussed that Phillip May will naturally be more and more sleepy and less interactive as he continues to decline. Emotional support provided.    Length of Stay: 12 days  Current Medications: Scheduled Meds:  . acetylcysteine  4 mL Nebulization BID  . antiseptic oral rinse  7 mL Mouth Rinse QID  . budesonide  0.25 mg Nebulization Q6H  . chlorhexidine  15 mL Mouth Rinse BID  . famotidine  20 mg Per Tube  BID  . feeding supplement (PRO-STAT SUGAR FREE 64)  30 mL Per Tube Daily  . free water  100 mL Per Tube 3 times per day  . furosemide  40 mg Intravenous Daily  . heparin subcutaneous  5,000 Units Subcutaneous 3 times per day  . ipratropium-albuterol  3 mL Nebulization Q6H  . multivitamin  5 mL Per Tube Daily  . nebivolol  10 mg Per J Tube Daily  . polyethylene glycol  17 g Oral Daily  . QUEtiapine  50 mg Per Tube QHS  . thiamine  100 mg Per J Tube Daily    Continuous Infusions: . sodium chloride 10 mL/hr at 08/17/14 0800  . feeding supplement (OSMOLITE 1.2 CAL) 1,000 mL (08/18/14 1751)    PRN Meds: acetaminophen **OR** [DISCONTINUED] acetaminophen, albuterol, fentaNYL (SUBLIMAZE) injection, hydrALAZINE, midazolam  Palliative Performance Scale: 20%     Vital Signs: BP 131/78 mmHg  Pulse 87  Temp(Src) 98 F (36.7 C) (Axillary)  Resp 18  Ht 5\' 7"  (1.702 m)  Wt 43.4 kg (95 lb 10.9 oz)  BMI 14.98 kg/m2  SpO2 98% SpO2: SpO2: 98 % O2 Device: O2 Device: Nasal Cannula O2 Flow Rate: O2 Flow Rate (L/min): 5 L/min  Intake/output summary:  Intake/Output Summary (Last 24 hours) at 08/19/14 1216 Last data filed at 08/19/14 0857  Gross per 24 hour  Intake    600 ml  Output    800  ml  Net   -200 ml   LBM: Last BM Date: 08/18/14 Baseline Weight: Weight: 45.904 kg (101 lb 3.2 oz) Most recent weight: Weight: 43.4 kg (95 lb 10.9 oz)  Physical Exam: General: Lying in bed HEENT: Temporal muscle wasting CVS: RRR Resp: Breathing labored, rhonchi throughout Abd: Soft, NT, ND Extrem: Warm, dry Neuro: Arousable, orientation difficult to assess d/t copious secretions and garbled speech   Additional Data Reviewed: Recent Labs     08/17/14  0334  NA  137  BUN  22*  CREATININE  0.65     Problem List:  Patient Active Problem List   Diagnosis Date Noted  . Palliative care encounter 08/18/2014  . DNR (do not resuscitate) 08/18/2014  . Dyspnea 08/18/2014  . Endotracheal tube  present   . Pneumonia   . Respiratory failure 08/08/2014  . Pressure ulcer 08/08/2014  . Endotracheally intubated   . Acute respiratory failure with hypercapnia 08/07/2014  . Aspiration pneumonia 08/07/2014  . Hyponatremia 08/07/2014  . Hyperglycemia 08/07/2014  . Protein-calorie malnutrition   . Dysphagia, unspecified(787.20)   . Unspecified protein-calorie malnutrition   . Closed comminuted intertrochanteric fracture of proximal femur 07/20/2014  . CAP (community acquired pneumonia) 07/17/2014  . Dysphagia 07/17/2014  . AKI (acute kidney injury)   . Arthritis 03/13/2011  . Tobacco abuse 03/13/2011  . Chronic cough 03/13/2011  . COPD, very severe 03/13/2011     Palliative Care Assessment & Plan    Code Status:  DNR  Goals of Care:  Comfort - will stop tube feedings when transfer to hospice facility  3. Symptom Management:  Secretions: Robinul 0.4 mg every 4 hours.  Dysnea/pain: Fentanyl 25 mcg every hour prn.    4. Prognosis: Days to a couple weeks  5. Discharge Planning: Hospice facility   Care plan was discussed with CSW.   Thank you for allowing the Palliative Medicine Team to assist in the care of this patient.   Time In: 1130 Time Out: 1230 Total Time Prolonged Time Billed  no     Greater than 50%  of this time was spent counseling and coordinating care related to the above assessment and plan.   Yong Channel, NP Palliative Medicine Team Pager # [phone] (M-F 8a-5p) Team Phone # [phone] (Nights/Weekends)  08/19/2014, 12:16 PM

## 2014-08-19 NOTE — Clinical Social Work Note (Addendum)
CSW spoke with patient's daughter by phone this AM. Daughter states that family has not made a decision about residential hospice placement. CSW explained to daughter that the patient is being discharged today. CSW explained that the patient can go to SNF with palliative services or residential hospice today. Daughter states she will give CSW a call back shortly.   10:15AM: Patient's son Italyhad contacted CSW. [place]had states that he was not made aware that the patient was going to be ready for DC today. [place]had states that he doesn't think this is a decision he can make this quickly. CSW validated Chad's concerns and explained that the decision to transition to full comfort care does not have to be made today, but if the patient is not going to residential hospice today, the patient will need to go to SNF with palliative services and transition to a hospice facility if/when they make this decision. [place]had requests to speak with the physician. CSW has paged the physician and physician plans to call the son to discuss. RNCM has been updated on situation.  12:18PM: RNCM has spoken with patient's son regarding need for decision. Patient's son is contemplating appealing the DC. CSW has made it clear to son that the a decision to hospice placement would need to be made so that arrangements can be made.     Roddie McBryant Marquite Attwood MSW, [place]CSWA, [place], [phone]

## 2014-08-20 ENCOUNTER — Encounter (HOSPITAL_COMMUNITY): Payer: Self-pay | Admitting: General Practice

## 2014-08-20 LAB — GLUCOSE, CAPILLARY
GLUCOSE-CAPILLARY: 141 mg/dL — AB (ref 65–99)
Glucose-Capillary: 133 mg/dL — ABNORMAL HIGH (ref 65–99)

## 2014-08-20 NOTE — Progress Notes (Signed)
Pt prepared for d/c to SNF. IV d/c'd. Skin intact except as most recently charted. Vitals are stable. Report called to receiving facility. Pt to be transported by ambulance service. 

## 2014-08-20 NOTE — Discharge Summary (Signed)
Physician Discharge Summary  Phillip May MRN: [id number] DOB/AGE: 06/27/1945 69 y.o.  PCP: Georgann Housekeeper, MD   Admit date: 08/07/2014 Discharge date: 08/20/2014  Discharge Diagnoses:     Principal Problem:   Acute respiratory failure with hypercapnia Active Problems:   Aspiration pneumonia   Hyponatremia   Hyperglycemia   Respiratory failure   Pressure ulcer   Endotracheally intubated   Pneumonia   Endotracheal tube present   Palliative care encounter   DNR (do not resuscitate)   Dyspnea    Follow-up recommendations Patient is being DC to SNF vs [facility]  called son [place] at [phone] explained to patient that if he appeals dc that he will be responsible for the hospital stay for today, as medically we are not doing anything for him, he is medically as stable as he can be given all his co morbidities and his current status of being hospice eligible  Patient may stay until tomorrow if family unable to decide      Medication List    STOP taking these medications        enoxaparin 30 MG/0.3ML injection  Commonly known as:  LOVENOX     feeding supplement (OSMOLITE 1.2 CAL) Liqd     folic acid 1 MG tablet  Commonly known as:  FOLVITE     free water Soln     HYDROcodone-acetaminophen 5-325 MG per tablet  Commonly known as:  NORCO/VICODIN     levalbuterol 1.25 MG/0.5ML nebulizer solution  Commonly known as:  XOPENEX     multivitamin with minerals Tabs tablet     nebivolol 5 MG tablet  Commonly known as:  BYSTOLIC     thiamine 100 MG tablet     tiotropium 18 MCG inhalation capsule  Commonly known as:  SPIRIVA HANDIHALER      TAKE these medications        budesonide-formoterol 160-4.5 MCG/ACT inhaler  Commonly known as:  SYMBICORT  Inhale 2 puffs into the lungs 2 (two) times daily.     guaiFENesin 600 MG 12 hr tablet  Commonly known as:  MUCINEX  Take 600 mg by mouth 2 (two) times daily.     LORazepam 2 MG/ML concentrated solution   Commonly known as:  ATIVAN  Take 0.5 mLs (1 mg total) by mouth every 6 (six) hours as needed for anxiety.     morphine CONCENTRATE 10 mg / 0.5 ml concentrated solution  Place 0.25 mLs (5 mg total) into feeding tube every 4 (four) hours as needed for severe pain or shortness of breath.         Discharge Condition: poor   Disposition: 03-Skilled Nursing Facility   Consults:  PCCM   Significant Diagnostic Studies:  Ct Abdomen Wo Contrast  07/21/2014   CLINICAL DATA:  69 year old male with generalized weakness. Evaluate anatomy prior to gastrostomy tube placement. History of dysphagia.  EXAM: CT ABDOMEN WITHOUT CONTRAST  TECHNIQUE: Multidetector CT imaging of the abdomen was performed following the standard protocol without IV contrast.  COMPARISON:  No priors.  FINDINGS: Lower chest: Areas of airspace consolidation in the right middle lobe and right lower lobe. Small right pleural effusion layering dependently. Areas of bronchiectasis and potential cavitation in the posterior aspect of the right lower lobe, poorly evaluated on today's noncontrast CT examination. 1.6 x 1.0 cm nodular density in the medial aspect of the left lower lobe (image 13 of series 5). Trace left pleural effusion. Atherosclerotic calcifications in the left anterior descending  and right coronary arteries.  Hepatobiliary: No definite cystic or solid hepatic lesions are identified on today's noncontrast CT examination. Gallbladder is not confidently identified and could be collapsed or surgically absent (no surgical clips are noted in the region of the gallbladder fossa).  Pancreas: Pancreas is poorly depicted, but grossly unremarkable.  Spleen: Unremarkable.  Adrenals/Urinary Tract: The unenhanced appearance of the kidneys and adrenal glands is normal bilaterally. No hydroureteronephrosis.  Stomach/Bowel: The unenhanced appearance of the visualized portions of the stomach is unremarkable. Importantly, however, there are loops  of small bowel and colon superficial to the visualized portions of the stomach. No pathologic dilatation of the visualized portions of the small bowel or colon.  Vascular/Lymphatic: Atherosclerosis throughout the visualized abdominal vasculature, without definite aneurysm. No definite lymphadenopathy in the visualized portions of the abdomen on today's noncontrast CT examination.  Other: No significant volume of ascites or definite pneumoperitoneum identified in the abdomen.  Musculoskeletal: Chronic appearing compression fracture of L1 with approximately 20% loss of anterior vertebral body height. There are no aggressive appearing lytic or blastic lesions noted in the visualized portions of the skeleton.  IMPRESSION: 1. Stomach is incompletely visualized on today's examination. The visualized portions of the stomach are remarkable for superficial loops of small bowel and colon interposed between the stomach in the anterior abdominal wall. 2. Airspace consolidation in the right middle and lower lobes concerning for acute infection. This appears be associated with some areas of bronchiectasis and potential cavitation in the posterior aspect of the right lower lobe. There is also a small right parapneumonic pleural effusion. 3. Pleural-based 1.6 x 1.0 cm nodular density in the medial aspect of the left lower lobe. This could also be infectious or inflammatory in etiology, however, the possibility of neoplasm is not excluded, and close attention on future followup examinations is recommended. There is also trace left pleural effusion. At this time, noncontrast chest CT is recommended in 2-3 weeks to assess for resolution of the right lower/middle lobe pneumonia following trial of antimicrobial therapy, and to reassess this left lower lobe nodular region. 4. Extensive atherosclerosis, including at least 2 vessel coronary artery disease. Please note that although the presence of coronary artery calcium documents the  presence of coronary artery disease, the severity of this disease and any potential stenosis cannot be assessed on this non-gated CT examination. Assessment for potential risk factor modification, dietary therapy or pharmacologic therapy may be warranted, if clinically indicated. 5. Additional incidental findings, as above.   Electronically Signed   By: Trudie Reed M.D.   On: 07/21/2014 20:26   Ct Angio Chest Pe W/cm &/or Wo Cm  08/08/2014   CLINICAL DATA:  Acute onset of respiratory distress. Initial encounter.  EXAM: CT ANGIOGRAPHY CHEST WITH CONTRAST  TECHNIQUE: Multidetector CT imaging of the chest was performed using the standard protocol during bolus administration of intravenous contrast. Multiplanar CT image reconstructions and MIPs were obtained to evaluate the vascular anatomy.  CONTRAST:  80mL OMNIPAQUE IOHEXOL 350 MG/ML SOLN  COMPARISON:  Chest radiograph performed earlier today at 2:27 a.m.  FINDINGS: There is no evidence of pulmonary embolus.  There is dense opacification of both lower lung lobes, concerning for bilateral lower lobe pneumonia. Minimal patchy opacities are seen within the right upper lobe. Bilateral emphysematous change is seen at the upper lung lobes. No definite pleural effusion or pneumothorax is seen. There is layering fluid within the right mainstem bronchus, concerning for aspiration. No masses are identified; no abnormal focal contrast enhancement  is seen.  The mediastinum is unremarkable in appearance. No mediastinal lymphadenopathy is seen. No pericardial effusion is identified. The great vessels are grossly unremarkable in appearance. The patient's endotracheal tube is seen ending 3 cm above the carina. No axillary lymphadenopathy is seen. The thyroid gland is unremarkable in appearance.  The visualized portions of the liver and spleen are unremarkable. The pancreas, adrenal glands and kidneys are not well assessed due to surrounding edema. Scattered calcification is  noted along the proximal abdominal aorta.  No acute osseous abnormalities are seen. Cervical spinal fusion hardware is noted. There is mild chronic compression deformity involving vertebral body L1.  Review of the MIP images confirms the above findings.  IMPRESSION: 1. No evidence of pulmonary embolus. 2. Dense bilateral lower lobe airspace opacification, compatible with bilateral lower lobe pneumonia. Mild patchy opacities seen within the right upper lobe. Layering fluid noted within the right mainstem bronchus, concerning for aspiration. 3. Bilateral emphysematous change at the upper lung lobes.   Electronically Signed   By: Roanna RaiderJeffery  Chang M.D.   On: 08/08/2014 04:08   Ir Fluoro Rm 30-60 Min  07/23/2014   CLINICAL DATA:  Dysphagia, aspiration pneumonia. Request for enteral feeding support. Recent CT had demonstrated no safe percutaneous approach to the nondistended stomach due to overlying small bowel and colon.  EXAM: IR FLOURO RM 0-60 MIN  COMPARISON:  CT 07/21/2014  TECHNIQUE: Patient was positioned prone on the supine on the procedure table.  Intravenous Fentanyl and Versed were administered as conscious sedation during continuous cardiorespiratory monitoring by the radiology RN, with a total moderate sedation time of less than 30 minutes.  A 5 French angled angiographic catheter placed as orogastric tube to allow gastric insufflation with air. Under fluoroscopy, using multiple obliquities, it was apparent that the redundant transverse colon and loops of small bowel continued overlie the gastric body and antrum, such that no safe percutaneous approach was available for gastrostomy tube placement.  IMPRESSION: 1. No safe percutaneous approach for gastrostomy tube placement due to overlying bowel. Consider surgical placement if needed.   Electronically Signed   By: Corlis Leak  Hassell M.D.   On: 07/23/2014 08:10   Dg Chest Port 1 View  08/16/2014   CLINICAL DATA:  Follow-up pneumonia.  EXAM: PORTABLE CHEST - 1 VIEW   COMPARISON:  08/15/2014  FINDINGS: Bibasilar airspace opacities are again noted, slightly improved since prior study. Somewhat limited study with patient's right arm and hand projecting over the right hemithorax. Small right pleural effusion is stable. Heart is upper limits normal in size.  IMPRESSION: Bibasilar opacities slightly improved since prior study. Small right effusion.   Electronically Signed   By: Charlett NoseKevin  Dover M.D.   On: 08/16/2014 07:55   Dg Chest Port 1 View  08/15/2014   CLINICAL DATA:  Pneumonia.  EXAM: PORTABLE CHEST - 1 VIEW  COMPARISON:  11/14/2014.  FINDINGS: Lower cervical ACDF.  Interval extubation.  Monitoring leads project over the chest.  No pneumothorax is present. Emphysema. Curvilinear density is present at the RIGHT lung base, probably representing RIGHT middle lobe atelectasis. There is a RIGHT pleural effusion, with probable subsegmental atelectasis in the RIGHT lower lobe.  Compared to yesterday's exam, there is increasing airspace opacity at the LEFT base, extending from retrocardiac region to the LEFT costophrenic angle.  IMPRESSION: 1. Interval extubation. 2. Shifting airspace disease with increasing airspace opacity at the LEFT lung base, probably representing combination of airspace disease, atelectasis and small effusion. 3. Opacity at the RIGHT lung  base likely represents RIGHT middle lobe atelectasis and shifting pleural effusion. Persistent RIGHT lower lobe consolidation.   Electronically Signed   By: Andreas Newport M.D.   On: 08/15/2014 10:46   Dg Chest Port 1 View  08/14/2014   CLINICAL DATA:  Severe COPD. Respiratory failure. Worsening shortness of breath  EXAM: PORTABLE CHEST - 1 VIEW  COMPARISON:  08/13/2014  FINDINGS: Endotracheal tube remains in place, unchanged. Continued persistent density at the right lung base. No confluent opacity on the left. Heart is normal size. Tortuosity of the thoracic aorta. No definite significant effusion. No acute bony abnormality.   IMPRESSION: Continued right basilar airspace opacity.  No significant change.   Electronically Signed   By: Charlett Nose M.D.   On: 08/14/2014 07:11   Dg Chest Port 1 View  08/13/2014   CLINICAL DATA:  Respiratory failure  EXAM: PORTABLE CHEST - 1 VIEW  COMPARISON:  Portable chest x-ray of August 12, 2014  FINDINGS: The left lung is well-expanded. There is minimal persistent subsegmental atelectasis inferioromedially on the left. On the right there is persistent increased density at the lung base. The cardiac silhouette is not enlarged. There is tortuosity of the ascending and descending thoracic aorta. The endotracheal tube tip lies 3.2 cm above the crotch of the carina.  IMPRESSION: Essentially stable appearance of the chest since yesterday's study. The endotracheal tube is in reasonable position.   Electronically Signed   By: David  [place] M.D.   On: 08/13/2014 07:42   Dg Chest Port 1 View  08/12/2014   CLINICAL DATA:  Followup exam.  Intubated patient.  EXAM: PORTABLE CHEST - 1 VIEW  COMPARISON:  08/12/2014 at 4:54 a.m.  FINDINGS: Endotracheal tube tip projects 3.5 cm above the carina.  Hazy opacity at the right lung base consistent with combination of atelectasis and pleural fluid is similar to the prior exam. Opacity at the left lung base has improved from prior studies, but is similar to the most recent exam, likely also atelectasis. No pulmonary edema. No pneumothorax.  Cardiac silhouette is normal in size.  Aorta is uncoiled.  IMPRESSION: 1. No significant change from the earlier chest radiograph allowing for differences in patient positioning and technique. 2. Endotracheal tube is well positioned. 3. Right greater than left lung base opacity is stable   Electronically Signed   By: Amie Portland M.D.   On: 08/12/2014 17:57   Dg Chest Port 1 View  08/12/2014   CLINICAL DATA:  Respiratory failure.  Endotracheal intubation.  EXAM: PORTABLE CHEST - 1 VIEW  COMPARISON:  08/11/2014  FINDINGS: The  endotracheal tube is 4.6 cm above the carina. There is persistent basilar opacity, right greater than left, unchanged. There is tortuosity and ectasia of the aorta, unchanged. There is no large effusion. There is no pneumothorax  IMPRESSION: No significant interval change in the bilateral airspace opacities.  Support equipment appears satisfactorily positioned.   Electronically Signed   By: Ellery Plunk M.D.   On: 08/12/2014 06:30   Dg Chest Port 1 View  08/11/2014   CLINICAL DATA:  Hypoxia  EXAM: PORTABLE CHEST - 1 VIEW  COMPARISON:  August 10, 2014  FINDINGS: Endotracheal tube tip is 3.9 cm above the carina. No pneumothorax. There is a right pleural effusion. There is patchy atelectasis in the lung bases. Lungs elsewhere clear. The heart is upper normal in size with pulmonary vascularity within normal limits. Aorta is tortuous. No adenopathy.  IMPRESSION: Endotracheal tube as described. No pneumothorax. Persistent  right pleural effusion with patchy bibasilar atelectasis. No new parenchymal lung opacity. No change in cardiac silhouette.   Electronically Signed   By: Bretta Bang III M.D.   On: 08/11/2014 08:03   Dg Chest Port 1 View  08/10/2014   CLINICAL DATA:  Pneumonia  EXAM: PORTABLE CHEST - 1 VIEW  COMPARISON:  08/09/2014  FINDINGS: The endotracheal tube is 3.7 cm above the carina. Basilar airspace opacities persist bilaterally. There probably are small effusions. There is no pneumothorax.  IMPRESSION: Satisfactory ET tube position. Unchanged basilar airspace opacities and probable small effusions.   Electronically Signed   By: Ellery Plunk M.D.   On: 08/10/2014 05:13   Dg Chest Port 1 View  08/09/2014   CLINICAL DATA:  Pneumonia, history COPD, hypertension, smoker  EXAM: PORTABLE CHEST - 1 VIEW  COMPARISON:  Portable exam 0458 hours compared to 08/08/2014  FINDINGS: Rotated to the LEFT.  Tip of endotracheal tube projects 9.9 cm above carina.  Normal heart size, mediastinal contours and  pulmonary vascularity.  BILATERAL lower lobe infiltrates.  Underlying emphysematous changes.  No gross pleural effusion or pneumothorax.  Diffuse osseous demineralization.  IMPRESSION: COPD changes with BILATERAL lower lobe infiltrates consistent with pneumonia.  High position of endotracheal tube; may advance tube 5 cm for more stable positioning.   Electronically Signed   By: Ulyses Southward M.D.   On: 08/09/2014 07:17   Dg Chest Port 1 View  08/08/2014   CLINICAL DATA:  ET tube placement  EXAM: PORTABLE CHEST - 1 VIEW  COMPARISON:  08/08/2014  FINDINGS: ET tube tip is above the carina. Normal heart size. Small bilateral pleural effusions persist. No airspace consolidation.  IMPRESSION: Persistent bilateral pleural effusions.   Electronically Signed   By: Signa Kell M.D.   On: 08/08/2014 08:07   Dg Chest Port 1 View  08/08/2014   CLINICAL DATA:  Respiratory failure. Endotracheal tube placement. Initial encounter.  EXAM: PORTABLE CHEST - 1 VIEW  COMPARISON:  Chest radiograph performed 08/07/2014  FINDINGS: The patient's endotracheal tube appears to end at or just below the carina. This could be retracted 2-3 cm.  Dense right basilar airspace opacification raises concern for pneumonia. More mild left basilar airspace opacity is seen. A small right pleural effusion is suspected. No pneumothorax is seen.  The cardiomediastinal silhouette is borderline normal in size. No acute osseous abnormalities are identified. Cervical spinal fusion hardware is partially imaged.  IMPRESSION: 1. Endotracheal tube seen ending at or just below the carina. This could be retracted 2-3 cm. 2. Dense right basilar airspace opacification raises concern for worsening pneumonia. More mild left basilar airspace opacity seen. Suspect small right pleural effusion.  These results were called by telephone at the time of interpretation on 08/08/2014 at 2:50 am to Nursing on [facility], who verbally acknowledged these results.   Electronically  Signed   By: Roanna Raider M.D.   On: 08/08/2014 02:50   Dg Chest Portable 1 View  08/07/2014   CLINICAL DATA:  Shortness of breath  EXAM: PORTABLE CHEST - 1 VIEW  COMPARISON:  07/22/2014  FINDINGS: Mild residual scarring/ opacity in the right middle/lower lobe, improved. No pleural effusion or pneumothorax.  The heart is normal in size.  IMPRESSION: Mild residual scarring/ opacity in the right middle/lower lobe, improved.   Electronically Signed   By: Charline Bills M.D.   On: 08/07/2014 20:34   Dg Chest Port 1 View  07/22/2014   CLINICAL DATA:  Right lower lobe infiltrate.  Increasing shortness of breath and chest congestion.  EXAM: PORTABLE CHEST - 1 VIEW  COMPARISON:  07/17/2014  FINDINGS: Slight improvement in the pneumonia in the right lower lobe. New accentuation of the interstitial markings at the left lung base. Probable small bilateral pleural effusions superimposed on emphysema. Tortuosity of the thoracic aorta. Overall heart size is normal.  IMPRESSION: Slight improvement in right lower lobe pneumonia. Interstitial accentuation at the left lung base could represent pneumonitis. Is the patient aspirating? COPD. Probable small effusions.   Electronically Signed   By: Francene Boyers M.D.   On: 07/22/2014 07:40   Dg Swallowing Func-speech Pathology  07/27/2014    Objective Swallowing Evaluation:    Patient Details  Name: DEAGO BURRUSS MRN: [id number] Date of Birth: Jul 22, 1945  Today's Date: 07/27/2014 Time: SLP Start Time (ACUTE ONLY): 0950-SLP Stop Time (ACUTE ONLY): 1033 SLP Time Calculation (min) (ACUTE ONLY): 43 min  Past Medical History:  Past Medical History  Diagnosis Date  . Inguinal hernia   . COPD (chronic obstructive pulmonary disease)   . GERD (gastroesophageal reflux disease)   . Chronic hoarseness   . Tobacco abuse   . Cervical spinal stenosis     C4-C5; C5-C6; severe stenosis and abnormal  cord signal C6-C7 s/p  decompression in 04/2004   Past Surgical History:  Past Surgical History   Procedure Laterality Date  . Inguinal hernia repair    . Posterior laminectomy / decompression cervical spine  2006  . Intramedullary (im) nail intertrochanteric Left 07/20/2014    Procedure: INTRAMEDULLARY (IM) NAIL INTERTROCHANTRIC LEFT HIP;  Surgeon:  [name], MD;  Location: [facility];  Service: Orthopedics;  Laterality:  Left;   HPI:  Other Pertinent Information: Dierre Crevier is a 69 y.o. male, history of  COPD (not on oxygen), ongoing smoking, occasional alcohol use, history of  dysphagia on a soft diet per MBS in September 2014, chronic hoarse voice,  GERD, generalized weakness and deconditioning.  Pt had fall with hip fx  requiring surgical repair.  PSH + for cervical decompression surgery in  2006, orthopedic sx during this admit.  Pt reported chronic problems with  hoarseness and dysphagia s/p cervical surgery 2006.  He also experienced  30 pound weight loss within 3 years-pt reported in 2014.  He lives at home  alone and receives help from neighbors for getting groceries and  activities of daily living.  The MBS in September 2014 did show trace  aspiration of thins and penetration of nectar liquids.  CXR showed right  lower lobe pna.    Pt had planned on getting PEG tube but today this was  discontinued. hospital. Unfortunately Dysphagia had not improved, after  discussion with patient, PEG tube placement was planned, because of the  anatomy this was not able to be done by interventional radiology, general  surgery planned PEG tube placement on 6/13-however patient/family were  hesistant to proceed-and is now tentatively scheduled for 07/28/14 per MD  note,  Pt continues to express desire to consume po intake and receive PEG  for nutrition.   No Data Recorded  Assessment / Plan / Recommendation CHL IP CLINICAL IMPRESSIONS 07/27/2014  Therapy Diagnosis Severe pharyngeal phase dysphagia;Mild cervical  esophageal phase dysphagia;Moderate pharyngeal phase dysphagia;Moderate  cervical esophageal phase  dysphagia  Clinical Impression Cough noted with expectoration of copious secretions  after dry swallow indicative of swallow ability.    Pt continues with moderately severe sensorimotor pharyngo-cervical  esophageal dysphagia without significant improvement since testing earlier  in this admission.  Weakness in pharyngeal contraction and tongue base  retraction/poor epiglottic deflection continues resulting in gross  pharyngeal residuals without adequate pt sensation.  With liquids pt  conducts multiple reflexive swallows (x4 - with liquids) with extended  breathhold *presumed to be compensatory to help clear and prevent gross  aspiration.  Poor hyolaryngeal elevation allows laryngeal penetration of  liquids during the swallow.  Mild amount of aspiration after the swallow  noted as barium spills posteior into open larynx from pyriform sinus.   Pt  did not consistently sense residuals today (mixed with secretions) but  cued swallow, liquid follow solids and cued expectoration (of secretions  mixed with barium) effective to clear 80%.     Using live video, educated pt to findings, effective compensations.  Also  recommend continue with suction to help pt with his secretion management.      Pt again expresses desire for po with risks known and pursuing feeding  tube for nutritional support only.  Pt understands feeding tube will not  prevent aspiration but dysphagia prevents him from obtaining adequate  nutrition via po. Recommend consider dys3/thin with strict precautions and  KNOWN aspiration risks per pt wishes.  Pt will require crushed or liquid  medications due to level of dysphagia.        No flowsheet data found.   CHL IP DIET RECOMMENDATION 07/27/2014  SLP Diet Recommendations Dysphagia 3 (Mech soft);Thin  Liquid Administration via Straw,cup  Medication Administration Crushed with puree  Compensations Slow rate;Small sips/bites;Follow solids with  liquid;Multiple dry swallows after each bite/sip, cough and  expectoration  t/o meal as needed  Postural Changes and/or Swallow Maneuvers n/a     CHL IP OTHER RECOMMENDATIONS 07/27/2014  Recommended Consults (None)  Oral Care Recommendations Oral care QID  Other Recommendations (None)     CH[facility] IP FOLLOW UP RECOMMENDATIONS 07/26/2014  Follow up Recommendations Skilled Nursing facility     [facility] IP FREQUENCY AND DURATION 07/27/2014  Speech Therapy Frequency (ACUTE ONLY) min 2x/week  Treatment Duration 1 week         [facility] IP REASON FOR REFERRAL 07/27/2014  Reason for Referral Objectively evaluate swallowing function     CH[facility] IP ORAL PHASE 07/27/2014                    Oral Phase WFL                                                                  [facility] IP PHARYNGEAL PHASE 07/27/2014  Pharyngeal Phase Impaired                                                                                                                    Pharyngeal Comment multiple swallows (x3) across consistencies  with  extended breath holding noted, mild laryngeal penetration noted during  swallow, trace aspiration noted after swallow due to spillage of barium  from pyriform sinus  into open larynx, following solids with liquids  helpful, cough and expectoration helpful to decrease residuals, head turn  right/left NOT helpful, limited postural changes available to use due to  cervical decompression sx in 06      [facility] IP CERVICAL ESOPHAGEAL PHASE 07/27/2014  Cervical Esophageal Phase Impaired                                   Thin Straw Esophageal backflow into the pharynx;Other (Comment)     Cervical Esophageal Comment pt continues with poor clearance of cervical  esophagus with backflow of thin and puree without pt sensation, barium  mixed with secretions, per prior MBS in 2014 pt with narrowing of cervical  esophagus - suspect hardware impinging into cervical esophagus and  contributing to decreased clearance             Donavan Burnet, MS [facility] SLP [phone]        Filed Weights   08/11/14 0442 08/12/14 0351 08/17/14  1559  Weight: 49.3 kg (108 lb 11 oz) 48.7 kg (107 lb 5.8 oz) 43.4 kg (95 lb 10.9 oz)     Microbiology: Recent Results (from the past 240 hour(s))  Clostridium Difficile by PCR (not at [facility])     Status: None   Collection Time: 08/14/14  6:27 AM  Result Value Ref Range Status   C difficile by pcr NEGATIVE NEGATIVE Final       Blood Culture    Component Value Date/Time   SDES TRACHEAL ASPIRATE 08/08/2014 0500   SPECREQUEST NONE 08/08/2014 0500   CULT  08/08/2014 0500    Non-Pathogenic Oropharyngeal-type Flora Isolated. Performed at [facility]    REPTSTATUS 08/11/2014 FINAL 08/08/2014 0500      Labs: Results for orders placed or performed during the hospital encounter of 08/07/14 (from the past 48 hour(s))  Glucose, capillary     Status: Abnormal   Collection Time: 08/19/14 12:09 AM  Result Value Ref Range   Glucose-Capillary 116 (H) 65 - 99 mg/dL  Glucose, capillary     Status: Abnormal   Collection Time: 08/19/14  8:02 AM  Result Value Ref Range   Glucose-Capillary 118 (H) 65 - 99 mg/dL  Glucose, capillary     Status: Abnormal   Collection Time: 08/19/14  5:00 PM  Result Value Ref Range   Glucose-Capillary 144 (H) 65 - 99 mg/dL  Glucose, capillary     Status: Abnormal   Collection Time: 08/20/14 12:43 AM  Result Value Ref Range   Glucose-Capillary 141 (H) 65 - 99 mg/dL   Comment 1 Notify RN    Comment 2 Document in Chart   Glucose, capillary     Status: Abnormal   Collection Time: 08/20/14  7:38 AM  Result Value Ref Range   Glucose-Capillary 133 (H) 65 - 99 mg/dL     Lipid Panel  No results found for: CHOL, TRIG, HDL, CHOLHDL, VLDL, LDLCALC, LDLDIRECT   No results found for: HGBA1C   Lab Results  Component Value Date   CREATININE 0.65 08/17/2014     HPI :KINGSTIN HEIMS is a 69 y.o. male  With a history of COPD, dysphasia, GERD, hypertension. The patient was hospitalized on 07/17/2014 after a fall and a history of community-acquired  versus aspiration pneumonia. During his hospital stay, patient was found to have significant dysphagia. Surgery was consulted and a gastrostomy tube was placed. The patient has been obtaining feeds through the gastrostomy tube. Patient was discharged to [facility] nursing facility for rehabilitation as the patient had a intertrochanteric hip fracture that was repaired by orthopedic during the same hospitalization. Today the patient was eating dinner and had a witnessed aspiration event. The patient had sudden onset of respiratory failure due to the aspiration. EMS was called and was placed on CPAP due to oxygenation in the 60s. When the patient arrived to the emergency department the patient was transitioned to BiPAP with improvement of his respiratory status with the patient currently satting approximately 90%. No provoking factors, but breathing improved on C Pap and BiPAP.  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 DNI Continue supportive care, pulmonary toilet, when necessary nebs   Chronic dysphagia and aspiration ,G tube present,Severe protein-calorie malnutrition - on TF via peg , deemed high risk for aspiration, TF DC  Last speech therapy evaluation was 6/15, they recommended  Dysphagia 3 (Mech soft);Thin for comfort only, currently the patient is nothing by mouth    Chronic anemia without overt blood loss   no CBC checked since 7/2, no further labs per palliative care discussion  Severe R>L LL PNA, NOS,R/o cdiff - negative Blood cultures no growth so far, completed full course of antibiotics treatment for pneumonia Last chest x-ray was done on 7/4 that shows improvement  Hyperglycemia,not be  monitored anymore after DC    H/O EtOH abuse Acute encephalopathy- resolved off sedation Severe baseline deconditioning overall poor prognosis with current  illness and failure to thrive.  His Pall Performance Scale is very poor   GOALS OF CARE ASSESSMENT  Code Status:  DNR  Goals of Care:  Comfort - will stop tube feedings when transfer to hospice facility  3. Symptom Management:  Secretions: Robinul 0.4 mg every 4 hours.  Dysnea/pain: Fentanyl 25 mcg every hour prn.   4. Prognosis: Days to a couple weeks  5. Discharge Planning: Hospice facility vs SNF   Discharge Exam:    Blood pressure 90/52, pulse 51, temperature 98.1 F (36.7 C), temperature source Oral, resp. rate 18, height 5\' 7"  (1.702 m), weight 43.4 kg (95 lb 10.9 oz), SpO2 97 %. General: Lying in bed HEENT: Temporal muscle wasting CVS: RRR Resp: Breathing labored, rhonchi throughout Abd: Soft, NT, ND Extrem: Warm, dry Neuro: Arousable, orientation difficult to assess d/t copious secretions and garbled speech    Discharge Instructions    Diet - low sodium heart healthy    Complete by:  As directed      Diet - low sodium heart healthy    Complete by:  As directed      Increase activity slowly    Complete by:  As directed      Increase activity slowly    Complete by:  As directed              Signed: Torii Royse 08/20/2014, 8:51 AM        Time spent >45 mins

## 2014-08-20 NOTE — Progress Notes (Addendum)
CSW (Clinical Child psychotherapistocial Worker) spoke with [facility] and confirmed plan for transfer today. [facility] CSW to notify CSW when paperwork is completed with family and transport can be arranged. CSW prepared pt dc packet and placed with shadow chart.  ADDENDUM: CSW arranged transportation. Pt nurse made aware. Family aware. CSW signing off.  Justin Meisenheimer, LCSWA [phone]

## 2014-08-20 NOTE — Care Management Note (Signed)
Case Management Note  Patient Details  Name: Francene CastleJames L Hatler MRN: [id number] Date of Birth: [birth year]  Subjective/Objective:     [facility]                 Action/Plan:   Expected Discharge Date:                  Expected Discharge Plan:  Hospice Medical Facility  In-House Referral:  Clinical Social Work  Discharge planning Services  CM Consult  Post Acute Care Choice:    Choice offered to:     DME Arranged:    DME Agency:     HH Arranged:    HH Agency:     Status of Service:  Completed, signed off  Medicare Important Message Given:  Yes-third notification given Date Medicare IM Given:    Medicare IM give by:    Date Additional Medicare IM Given:    Additional Medicare Important Message give by:     If discussed at Long Length of Stay Meetings, dates discussed:    Additional Comments:  Leone Havenaylor, Zekiel Torian Bogan, RN 08/20/2014, 6:53 PM

## 2014-08-20 NOTE — Care Management (Signed)
Important Message  Patient Details  Name: Phillip May MRN: [id number] Date of Birth: 31-Aug-1945   Medicare Important Message Given:  Yes-third notification given    Leone Havenaylor, Loy Mccartt [place], RN 08/20/2014, 10:29 AM

## 2014-09-13 DEATH — deceased

## 2017-03-11 IMAGING — DX DG KNEE 1-2V PORT*L*
2 series · 2 of 2 positions shown · non-contrast
Comparison: None.

CLINICAL DATA: Status post fall.

EXAM:
PORTABLE LEFT KNEE - 1-2 VIEW

[knee lat]
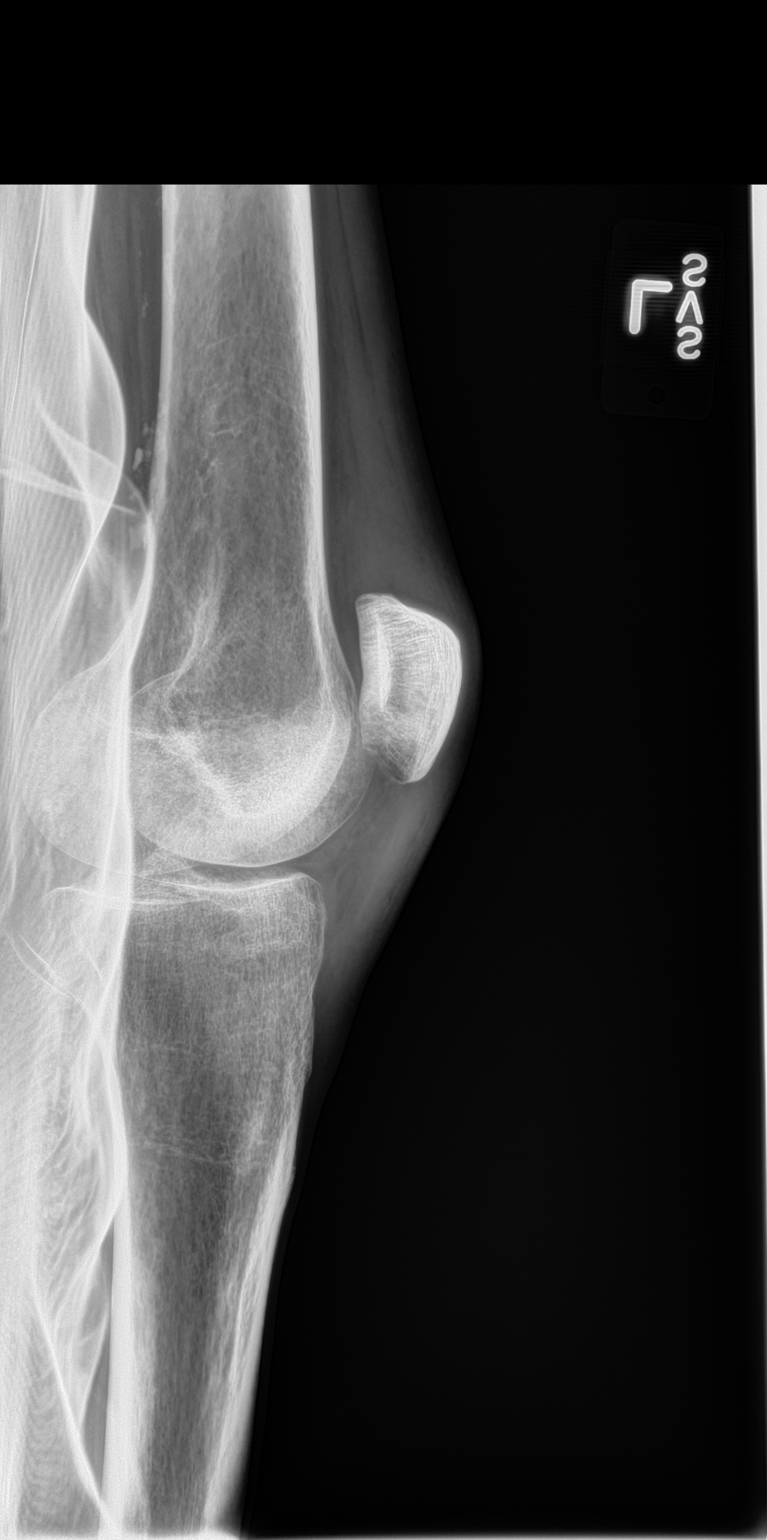

[knee ap]
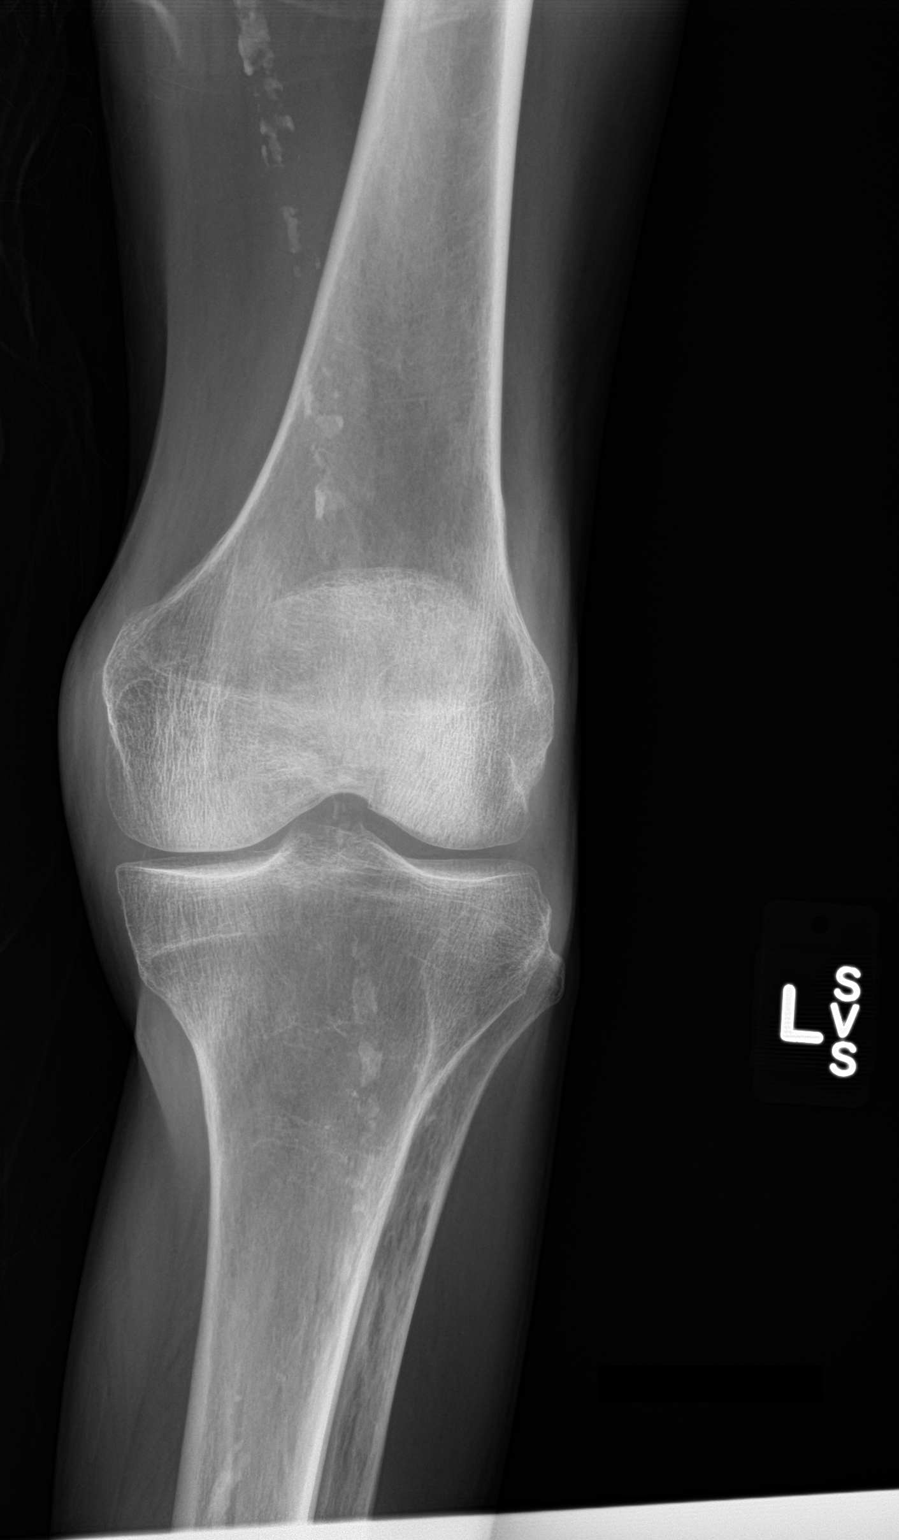

[2 of 2 positions shown; findings below may reference images not displayed]

FINDINGS: There is no evidence of fracture, dislocation, or joint effusion.
There is generalized osteopenia. There is no evidence of arthropathy
or other focal bone abnormality. Soft tissues are unremarkable.
There is peripheral vascular atherosclerotic disease.
IMPRESSION: No acute osseous injury of the left knee.

## 2017-03-12 IMAGING — CR DG PORTABLE PELVIS
1 series · 1 of 1 positions shown · non-contrast
Comparison: Preoperative radiographs 07/19/2014

CLINICAL DATA: 61-year-old status post left hip repair

EXAM:
PORTABLE PELVIS 1-2 VIEWS

[AP]
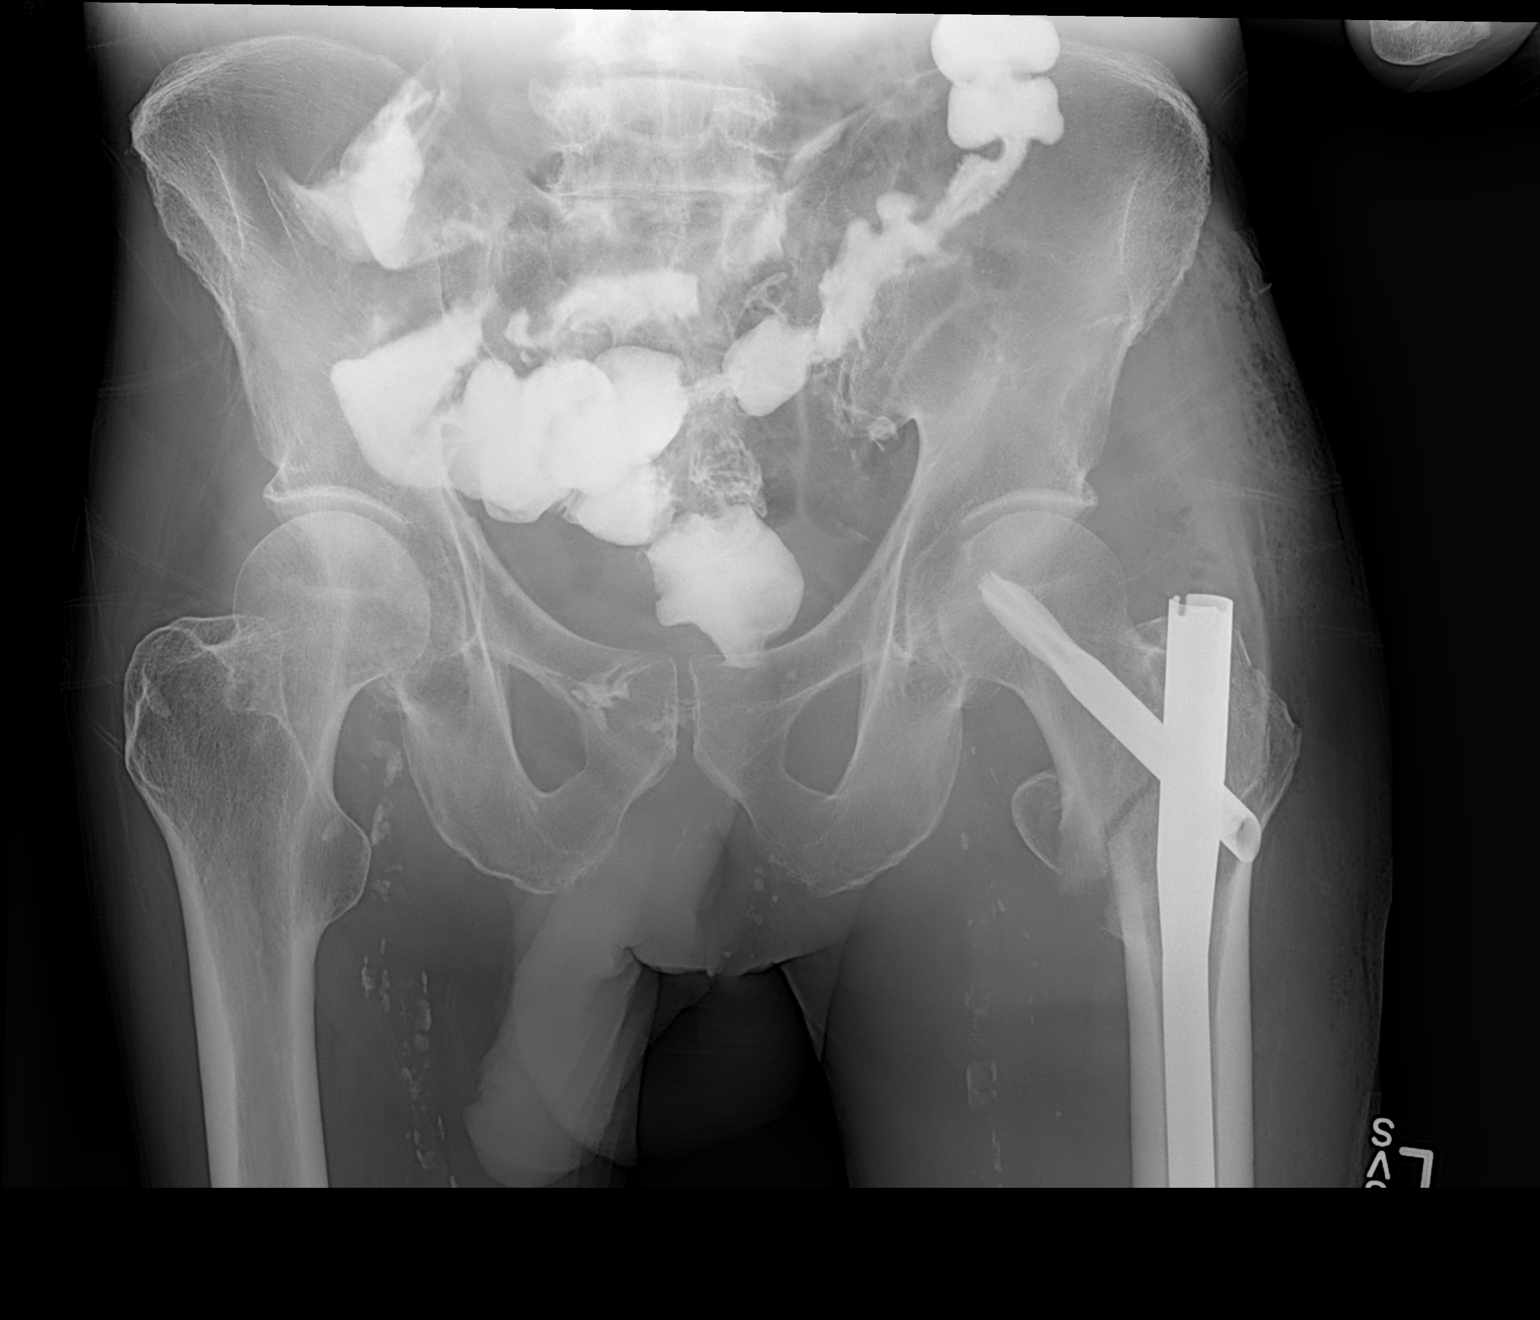

[1 of 1 positions shown; findings below may reference images not displayed]

FINDINGS: Interval ORIF of intertrochanteric left femoral fracture with intra
medullary nail and a transfemoral neck gamma nail. Improved
alignment of the fracture fragments. No evidence of acute hardware
complication. Expected postoperative subcutaneous emphysema. Oral
contrast material opacifies the colon. Scattered atherosclerotic
vascular calcifications are noted.
IMPRESSION: ORIF left intertrochanteric femoral fracture without evidence of
complication.

## 2017-03-12 IMAGING — RF DG FEMUR 2+V*L*
1 series · 4 of 4 positions shown · non-contrast
Comparison: Preoperative radiographs 07/19/2014

CLINICAL DATA: 61-year-old male with left intertrochanteric femoral
fracture undergoing ORIF

EXAM:
DG C-ARM 61-120 MIN; LEFT FEMUR 2 VIEWS

[Series 1: run · 4 of 4 slices shown]
[im 1/4]
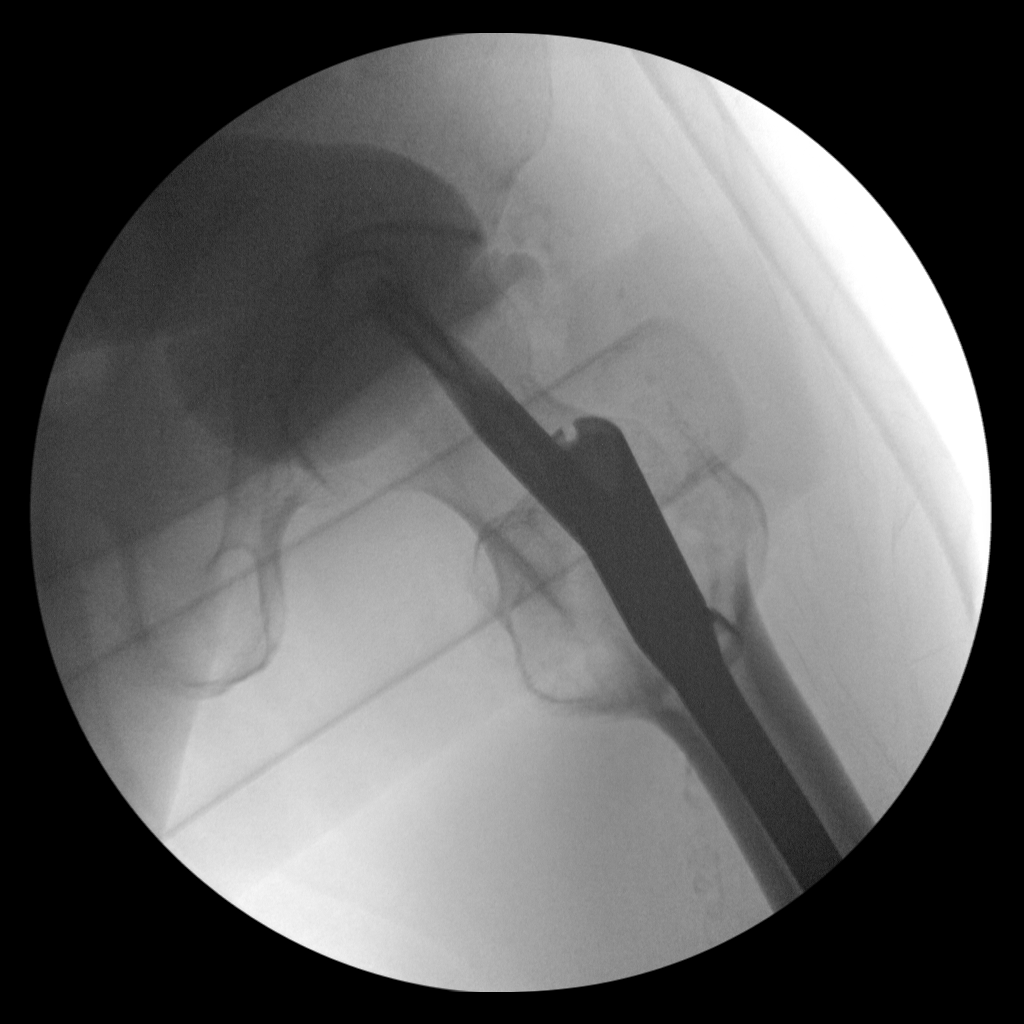
[im 2/4]
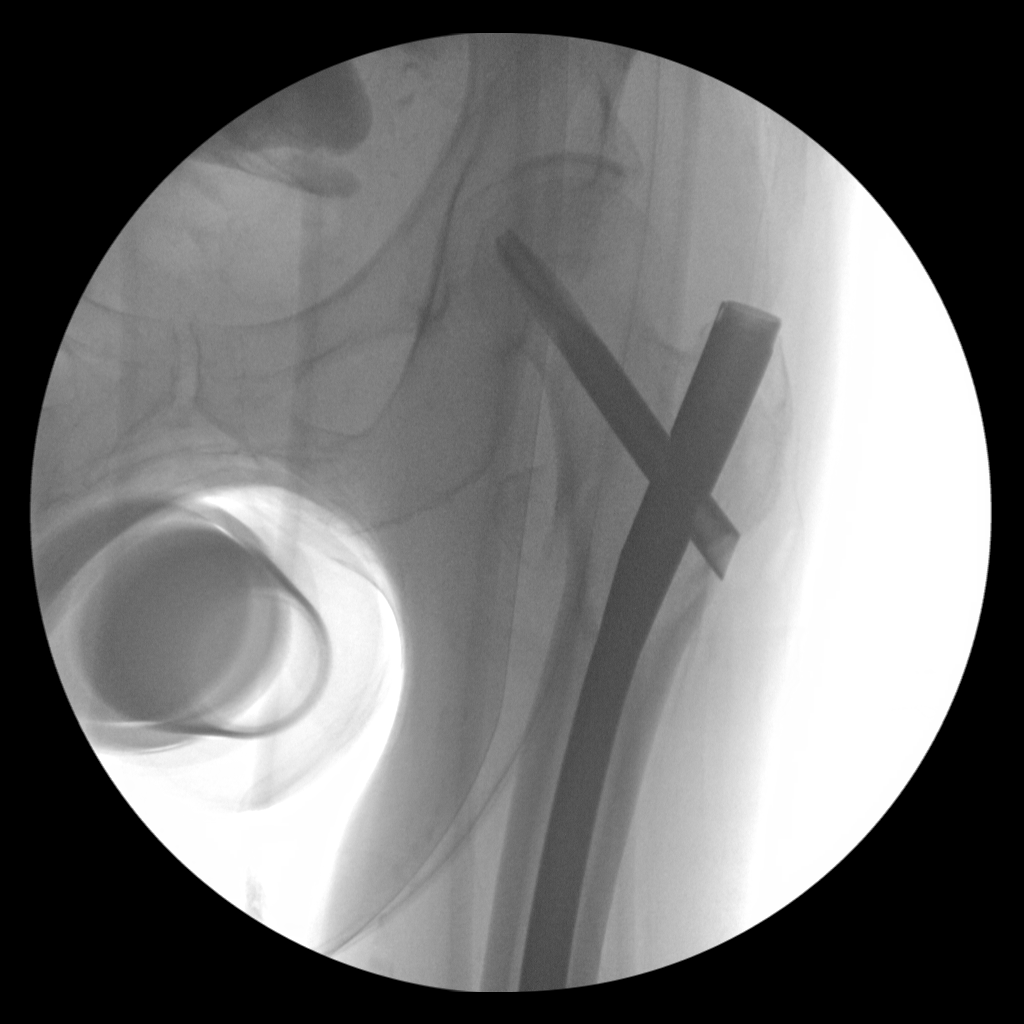
[im 3/4]
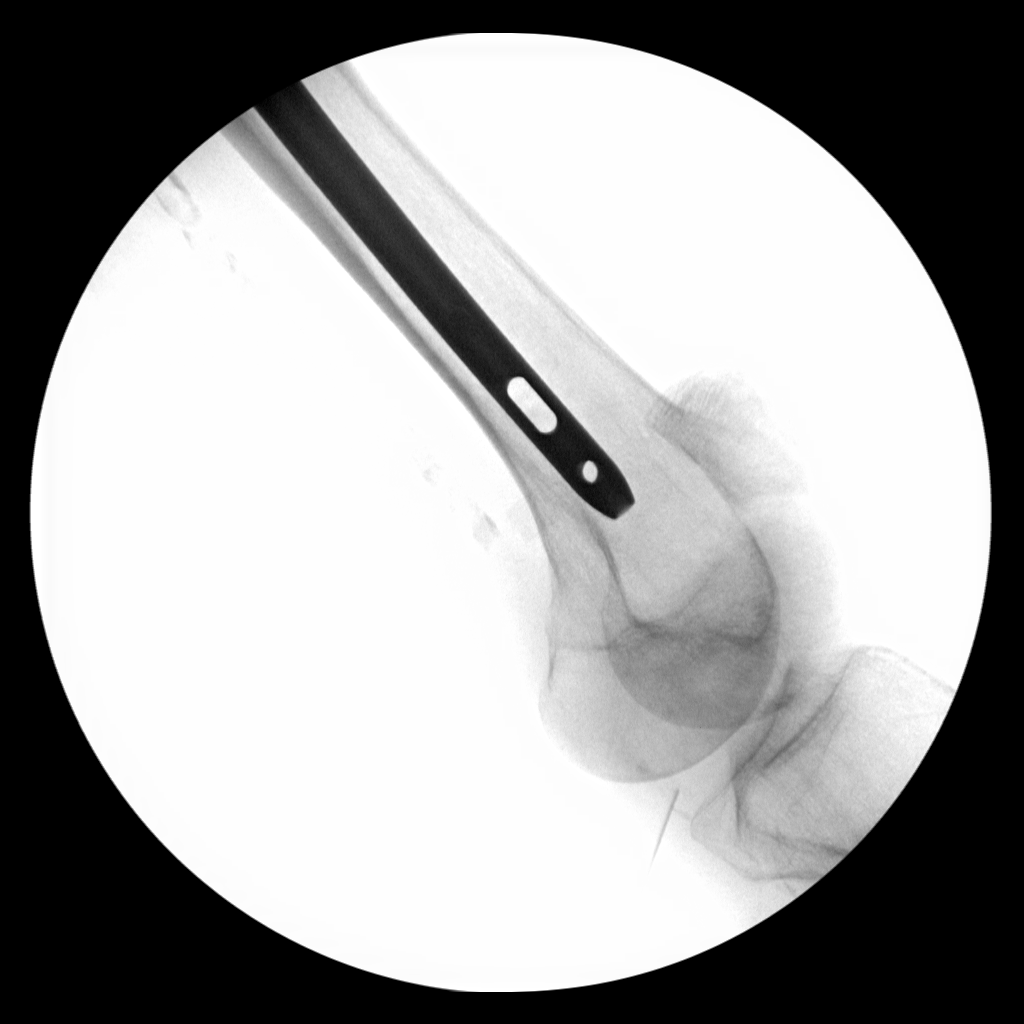
[im 4/4]
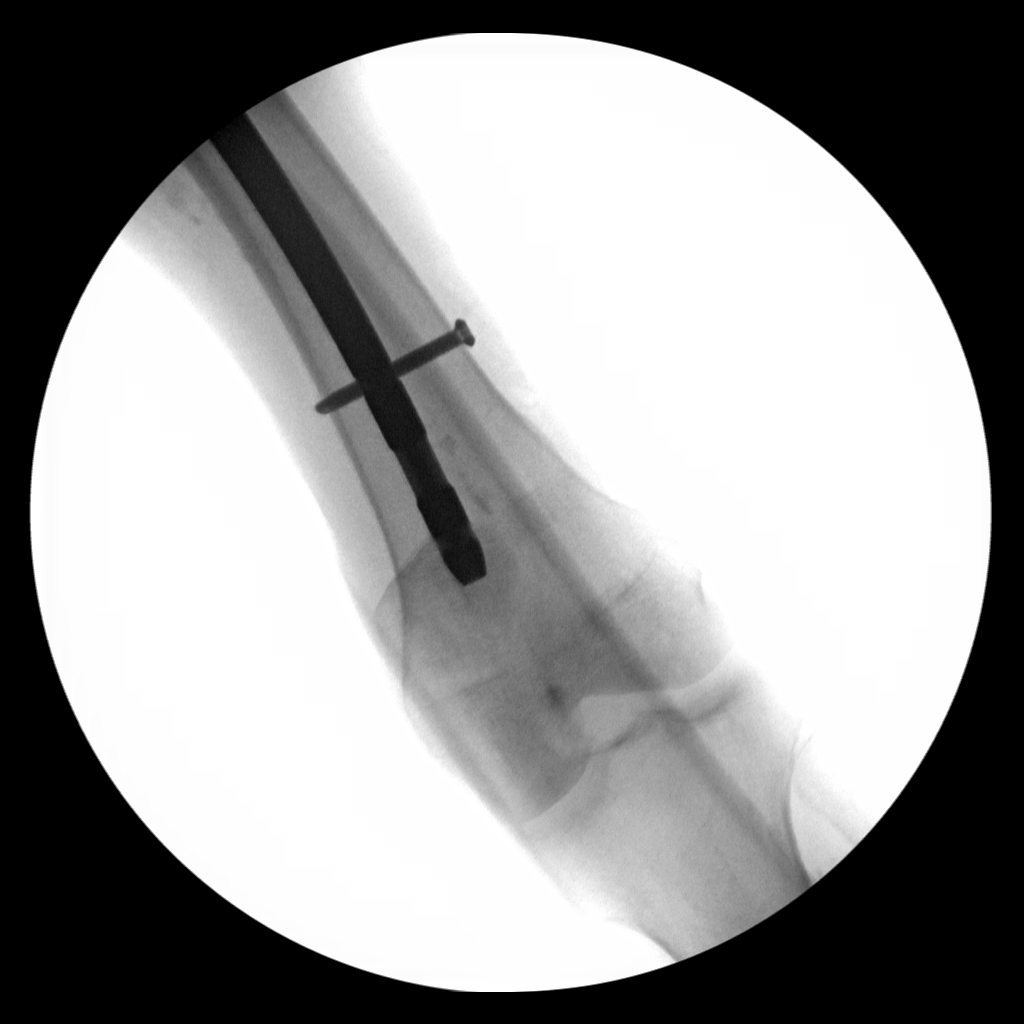

[4 of 4 positions shown; findings below may reference images not displayed]

FINDINGS: For intraoperative spot radiographs demonstrate open reduction
internal fixation of intertrochanteric femoral fracture with an
intra medullary femoral nail and transfemoral neck gamma nail. No
evidence of immediate hardware complication. There is a single
distal interlocking screw.
IMPRESSION: ORIF of left intertrochanteric femoral fracture without evidence of
immediate complication

## 2017-03-13 IMAGING — CT CT ABDOMEN W/O CM
1 of 2 series · 12 of 32 positions shown, 17 images · non-contrast
Comparison: No priors.

CLINICAL DATA: 69-year-old male with generalized weakness. Evaluate
anatomy prior to gastrostomy tube placement. History of dysphagia.

EXAM:
CT ABDOMEN WITHOUT CONTRAST
TECHNIQUE: Multidetector CT imaging of the abdomen was performed following the
standard protocol without IV contrast.

[Series 3: rtn ap without · axial · non-contrast · 0.70mm/px · z∈[-210,-40]mm · 12 of 40 slices shown, 17 images]
[im 3/40  soft-tissue]
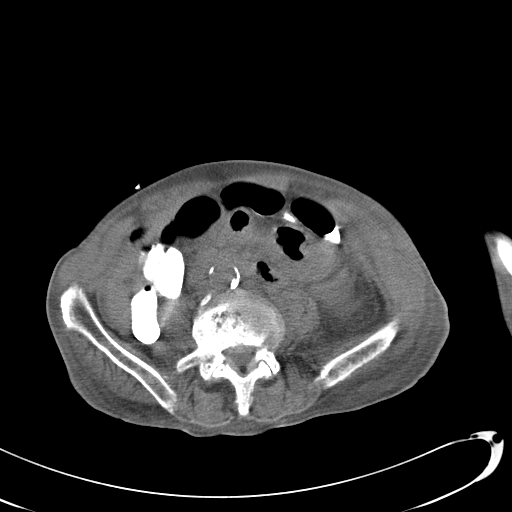
[im 3/40  bone]
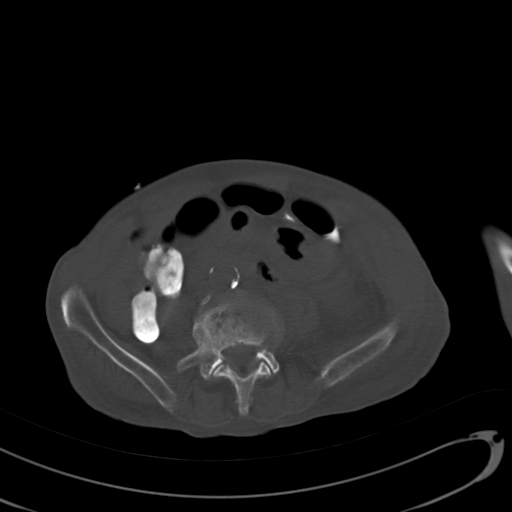
[im 7/40  soft-tissue]
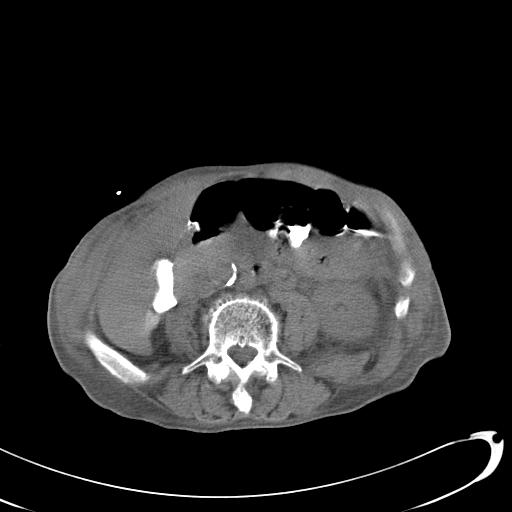
[im 9/40  soft-tissue]
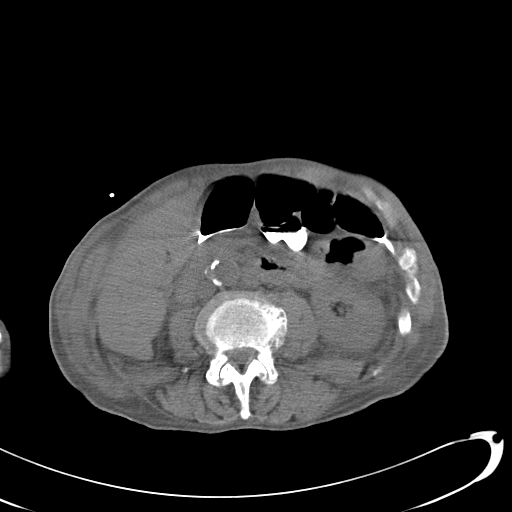
[im 14/40  soft-tissue]
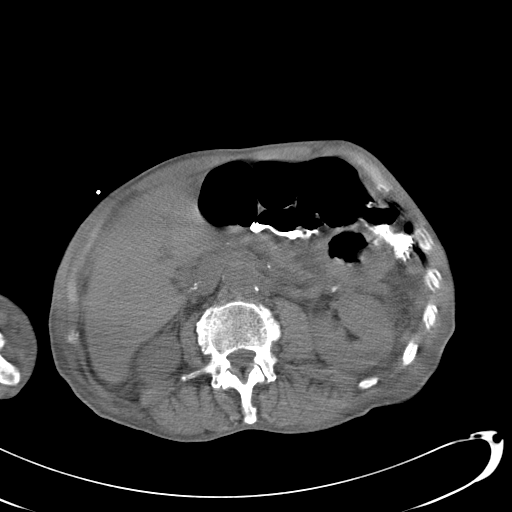
[im 16/40  soft-tissue]
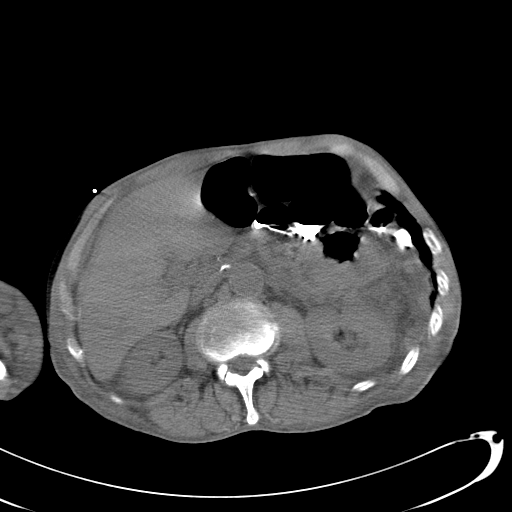
[im 20/40  soft-tissue]
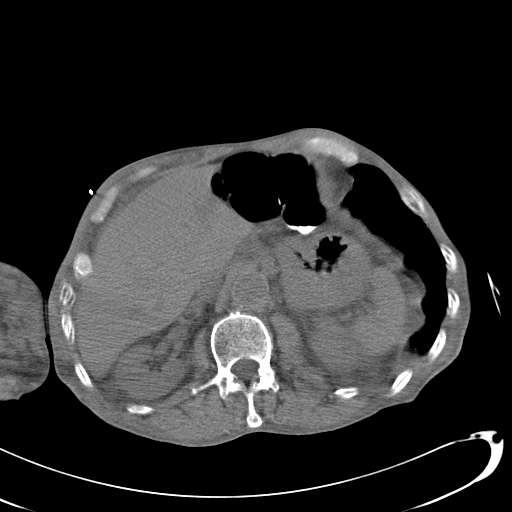
[im 24/40  soft-tissue]
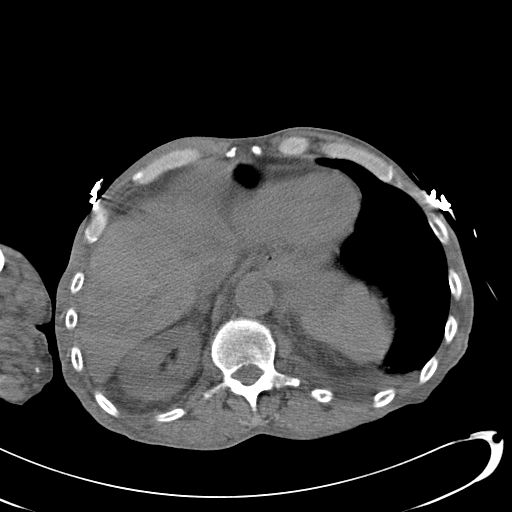
[im 27/40  soft-tissue]
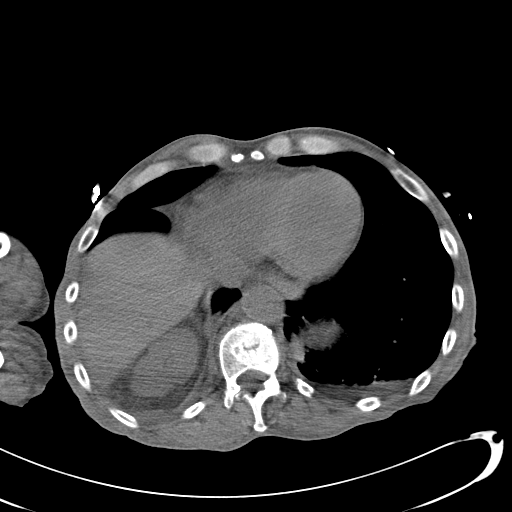
[im 31/40  soft-tissue]
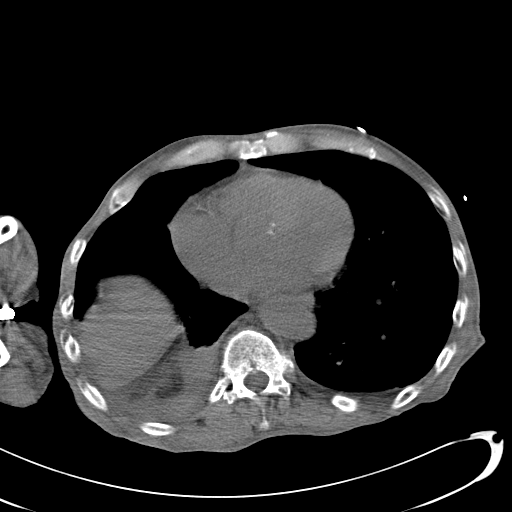
[im 31/40  lung]
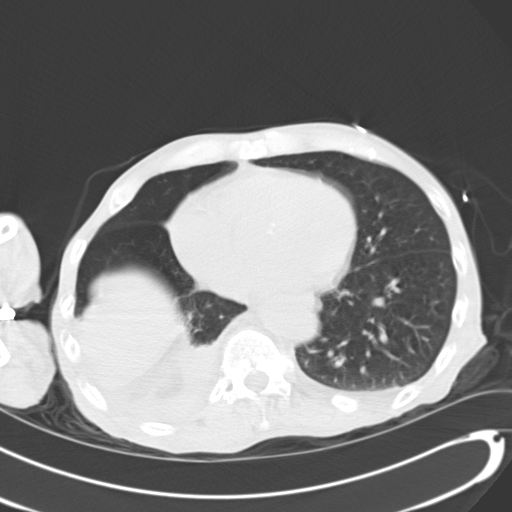
[im 31/40  bone]
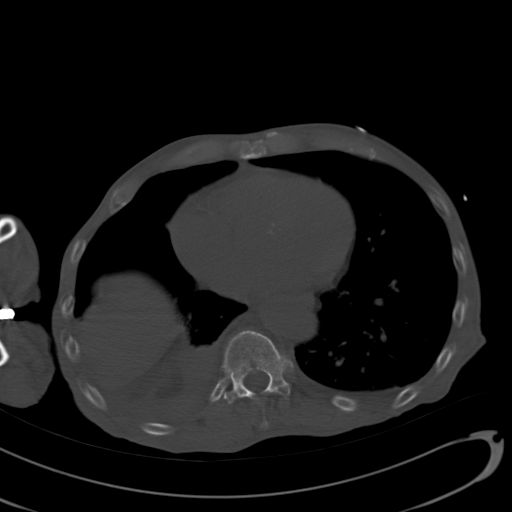
[im 33/40  soft-tissue]
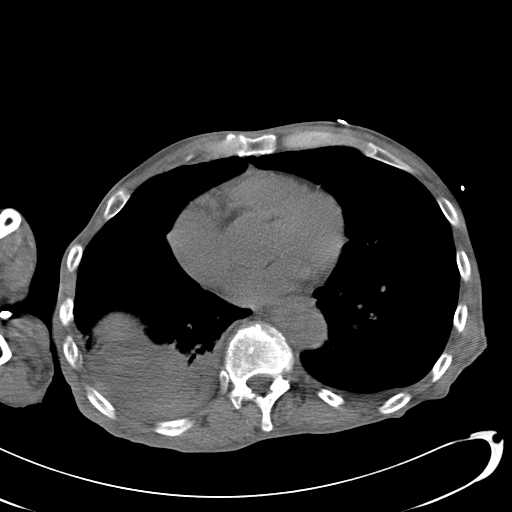
[im 33/40  lung]
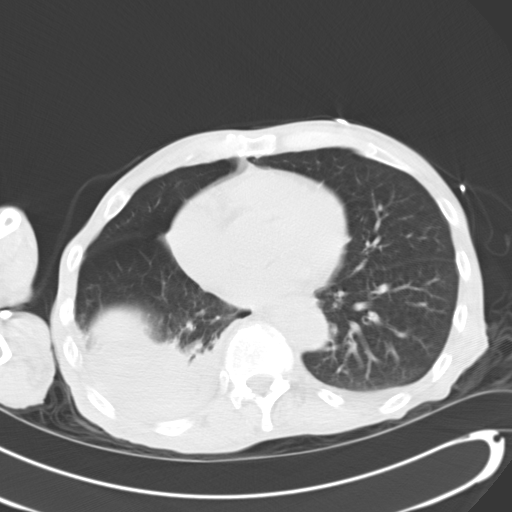
[im 35/40  lung]
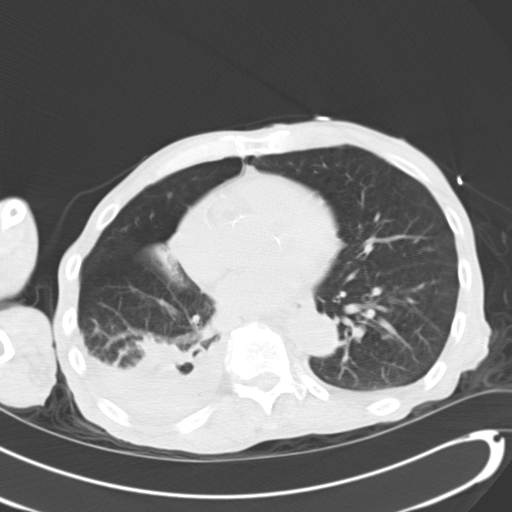
[im 37/40  soft-tissue]
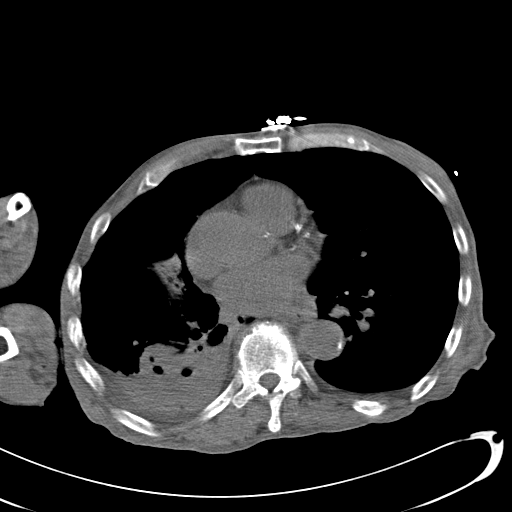
[im 37/40  lung]
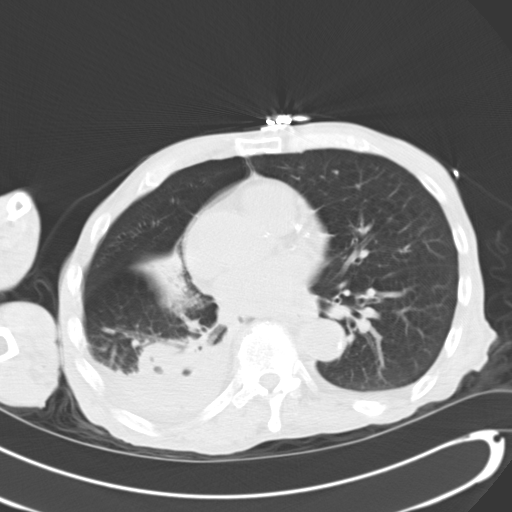

[12 of 32 positions shown; findings below may reference images not displayed]

FINDINGS: Lower chest: Areas of airspace consolidation in the right middle
lobe and right lower lobe. Small right pleural effusion layering
dependently. Areas of bronchiectasis and potential cavitation in the
posterior aspect of the right lower lobe, poorly evaluated on
today's noncontrast CT examination. 1.6 x 1.0 cm nodular density in
the medial aspect of the left lower lobe (image 13 of series 5).
Trace left pleural effusion. Atherosclerotic calcifications in the
left anterior descending and right coronary arteries.

Hepatobiliary: No definite cystic or solid hepatic lesions are
identified on today's noncontrast CT examination. Gallbladder is not
confidently identified and could be collapsed or surgically absent
(no surgical clips are noted in the region of the gallbladder
fossa).

Pancreas: Pancreas is poorly depicted, but grossly unremarkable.

Spleen: Unremarkable.

Adrenals/Urinary Tract: The unenhanced appearance of the kidneys and
adrenal glands is normal bilaterally. No hydroureteronephrosis.

Stomach/Bowel: The unenhanced appearance of the visualized portions
of the stomach is unremarkable. Importantly, however, there are
loops of small bowel and colon superficial to the visualized
portions of the stomach. No pathologic dilatation of the visualized
portions of the small bowel or colon.

Vascular/Lymphatic: Atherosclerosis throughout the visualized
abdominal vasculature, without definite aneurysm. No definite
lymphadenopathy in the visualized portions of the abdomen on today's
noncontrast CT examination.

Other: No significant volume of ascites or definite pneumoperitoneum
identified in the abdomen.

Musculoskeletal: Chronic appearing compression fracture of L1 with
approximately 20% loss of anterior vertebral body height. There are
no aggressive appearing lytic or blastic lesions noted in the
visualized portions of the skeleton.
IMPRESSION: 1. Stomach is incompletely visualized on today's examination. The
visualized portions of the stomach are remarkable for superficial
loops of small bowel and colon interposed between the stomach in the
anterior abdominal wall.
2. Airspace consolidation in the right middle and lower lobes
concerning for acute infection. This appears be associated with some
areas of bronchiectasis and potential cavitation in the posterior
aspect of the right lower lobe. There is also a small right
parapneumonic pleural effusion.
3. Pleural-based 1.6 x 1.0 cm nodular density in the medial aspect
of the left lower lobe. This could also be infectious or
inflammatory in etiology, however, the possibility of neoplasm is
not excluded, and close attention on future followup examinations is
recommended. There is also trace left pleural effusion. At this
time, noncontrast chest CT is recommended in 2-3 weeks to assess for
resolution of the right lower/middle lobe pneumonia following trial
of antimicrobial therapy, and to reassess this left lower lobe
nodular region.
4. Extensive atherosclerosis, including at least 2 vessel coronary
artery disease. Please note that although the presence of coronary
artery calcium documents the presence of coronary artery disease,
the severity of this disease and any potential stenosis cannot be
assessed on this non-gated CT examination. Assessment for potential
risk factor modification, dietary therapy or pharmacologic therapy
may be warranted, if clinically indicated.
5. Additional incidental findings, as above.
